# Patient Record
Sex: Female | Born: 1985 | Race: Black or African American | Hispanic: No | Marital: Married | Smoking: Current some day smoker
Health system: Southern US, Community
[De-identification: ages and names within clinical notes are randomized; demographics above are authoritative.]

## PROBLEM LIST (undated history)

## (undated) ENCOUNTER — Emergency Department (HOSPITAL_COMMUNITY): Admission: EM | Payer: Medicaid Other | Source: Home / Self Care

## (undated) ENCOUNTER — Inpatient Hospital Stay (HOSPITAL_COMMUNITY): Payer: Self-pay

## (undated) DIAGNOSIS — R51 Headache: Secondary | ICD-10-CM

## (undated) DIAGNOSIS — R011 Cardiac murmur, unspecified: Secondary | ICD-10-CM

## (undated) DIAGNOSIS — Z8619 Personal history of other infectious and parasitic diseases: Secondary | ICD-10-CM

## (undated) DIAGNOSIS — N390 Urinary tract infection, site not specified: Secondary | ICD-10-CM

## (undated) HISTORY — PX: INDUCED ABORTION: SHX677

## (undated) HISTORY — PX: DILATION AND CURETTAGE OF UTERUS: SHX78

## (undated) HISTORY — DX: Personal history of other infectious and parasitic diseases: Z86.19

## (undated) HISTORY — PX: TUBAL LIGATION: SHX77

---

## 2006-02-14 ENCOUNTER — Emergency Department (HOSPITAL_COMMUNITY): Admission: EM | Admit: 2006-02-14 | Discharge: 2006-02-14 | Payer: Self-pay | Admitting: *Deleted

## 2006-08-09 ENCOUNTER — Emergency Department (HOSPITAL_COMMUNITY): Admission: EM | Admit: 2006-08-09 | Discharge: 2006-08-09 | Payer: Self-pay | Admitting: Emergency Medicine

## 2006-08-13 ENCOUNTER — Emergency Department (HOSPITAL_COMMUNITY): Admission: EM | Admit: 2006-08-13 | Discharge: 2006-08-13 | Payer: Self-pay | Admitting: Emergency Medicine

## 2006-09-14 ENCOUNTER — Emergency Department (HOSPITAL_COMMUNITY): Admission: EM | Admit: 2006-09-14 | Discharge: 2006-09-14 | Payer: Self-pay | Admitting: Emergency Medicine

## 2007-08-18 ENCOUNTER — Inpatient Hospital Stay (HOSPITAL_COMMUNITY): Admission: AD | Admit: 2007-08-18 | Discharge: 2007-08-18 | Payer: Self-pay | Admitting: Obstetrics & Gynecology

## 2007-10-08 ENCOUNTER — Inpatient Hospital Stay (HOSPITAL_COMMUNITY): Admission: AD | Admit: 2007-10-08 | Discharge: 2007-10-08 | Payer: Self-pay | Admitting: Gynecology

## 2007-10-09 ENCOUNTER — Inpatient Hospital Stay (HOSPITAL_COMMUNITY): Admission: AD | Admit: 2007-10-09 | Discharge: 2007-10-09 | Payer: Self-pay | Admitting: Obstetrics and Gynecology

## 2007-10-11 ENCOUNTER — Inpatient Hospital Stay (HOSPITAL_COMMUNITY): Admission: AD | Admit: 2007-10-11 | Discharge: 2007-10-11 | Payer: Self-pay | Admitting: Obstetrics & Gynecology

## 2007-10-13 ENCOUNTER — Inpatient Hospital Stay (HOSPITAL_COMMUNITY): Admission: AD | Admit: 2007-10-13 | Discharge: 2007-10-13 | Payer: Self-pay | Admitting: Obstetrics and Gynecology

## 2007-10-20 ENCOUNTER — Inpatient Hospital Stay (HOSPITAL_COMMUNITY): Admission: AD | Admit: 2007-10-20 | Discharge: 2007-10-20 | Payer: Self-pay | Admitting: Obstetrics & Gynecology

## 2007-10-28 ENCOUNTER — Observation Stay (HOSPITAL_COMMUNITY): Admission: AD | Admit: 2007-10-28 | Discharge: 2007-10-28 | Payer: Self-pay | Admitting: Obstetrics and Gynecology

## 2007-11-09 ENCOUNTER — Inpatient Hospital Stay (HOSPITAL_COMMUNITY): Admission: AD | Admit: 2007-11-09 | Discharge: 2007-11-09 | Payer: Self-pay | Admitting: Obstetrics & Gynecology

## 2008-02-07 ENCOUNTER — Inpatient Hospital Stay (HOSPITAL_COMMUNITY): Admission: AD | Admit: 2008-02-07 | Discharge: 2008-02-07 | Payer: Self-pay | Admitting: Obstetrics

## 2008-03-24 ENCOUNTER — Inpatient Hospital Stay (HOSPITAL_COMMUNITY): Admission: AD | Admit: 2008-03-24 | Discharge: 2008-03-24 | Payer: Self-pay | Admitting: Obstetrics

## 2008-04-01 ENCOUNTER — Inpatient Hospital Stay (HOSPITAL_COMMUNITY): Admission: AD | Admit: 2008-04-01 | Discharge: 2008-04-01 | Payer: Self-pay | Admitting: Obstetrics

## 2008-04-15 ENCOUNTER — Inpatient Hospital Stay (HOSPITAL_COMMUNITY): Admission: AD | Admit: 2008-04-15 | Discharge: 2008-04-15 | Payer: Self-pay | Admitting: Obstetrics

## 2008-05-09 ENCOUNTER — Inpatient Hospital Stay (HOSPITAL_COMMUNITY): Admission: AD | Admit: 2008-05-09 | Discharge: 2008-05-11 | Payer: Self-pay | Admitting: Obstetrics

## 2008-05-09 ENCOUNTER — Inpatient Hospital Stay (HOSPITAL_COMMUNITY): Admission: AD | Admit: 2008-05-09 | Discharge: 2008-05-09 | Payer: Self-pay | Admitting: Obstetrics

## 2008-05-13 ENCOUNTER — Inpatient Hospital Stay (HOSPITAL_COMMUNITY): Admission: AD | Admit: 2008-05-13 | Discharge: 2008-05-13 | Payer: Self-pay | Admitting: Obstetrics

## 2008-05-25 ENCOUNTER — Inpatient Hospital Stay (HOSPITAL_COMMUNITY): Admission: AD | Admit: 2008-05-25 | Discharge: 2008-05-25 | Payer: Self-pay | Admitting: Obstetrics

## 2008-05-31 ENCOUNTER — Inpatient Hospital Stay (HOSPITAL_COMMUNITY): Admission: AD | Admit: 2008-05-31 | Discharge: 2008-05-31 | Payer: Self-pay | Admitting: Obstetrics

## 2008-06-09 ENCOUNTER — Inpatient Hospital Stay (HOSPITAL_COMMUNITY): Admission: AD | Admit: 2008-06-09 | Discharge: 2008-06-09 | Payer: Self-pay | Admitting: Obstetrics

## 2008-06-15 ENCOUNTER — Inpatient Hospital Stay (HOSPITAL_COMMUNITY): Admission: AD | Admit: 2008-06-15 | Discharge: 2008-06-17 | Payer: Self-pay | Admitting: Obstetrics

## 2008-10-04 ENCOUNTER — Inpatient Hospital Stay (HOSPITAL_COMMUNITY): Admission: AD | Admit: 2008-10-04 | Discharge: 2008-10-04 | Payer: Self-pay | Admitting: Obstetrics

## 2008-11-01 ENCOUNTER — Encounter (INDEPENDENT_AMBULATORY_CARE_PROVIDER_SITE_OTHER): Payer: Self-pay | Admitting: Cardiology

## 2008-11-01 ENCOUNTER — Ambulatory Visit (HOSPITAL_COMMUNITY): Admission: RE | Admit: 2008-11-01 | Discharge: 2008-11-01 | Payer: Self-pay | Admitting: Cardiology

## 2008-12-12 ENCOUNTER — Encounter: Admission: RE | Admit: 2008-12-12 | Discharge: 2008-12-12 | Payer: Self-pay | Admitting: Cardiology

## 2009-01-01 ENCOUNTER — Emergency Department (HOSPITAL_COMMUNITY): Admission: EM | Admit: 2009-01-01 | Discharge: 2009-01-01 | Payer: Self-pay | Admitting: Emergency Medicine

## 2010-02-14 ENCOUNTER — Inpatient Hospital Stay (HOSPITAL_COMMUNITY): Admission: AD | Admit: 2010-02-14 | Discharge: 2010-02-14 | Payer: Self-pay | Admitting: Obstetrics

## 2010-02-14 ENCOUNTER — Ambulatory Visit: Payer: Self-pay | Admitting: Nurse Practitioner

## 2010-03-13 ENCOUNTER — Emergency Department (HOSPITAL_COMMUNITY): Admission: EM | Admit: 2010-03-13 | Discharge: 2010-03-13 | Payer: Self-pay | Admitting: Emergency Medicine

## 2010-05-21 ENCOUNTER — Ambulatory Visit: Payer: Self-pay | Admitting: Nurse Practitioner

## 2010-05-21 ENCOUNTER — Inpatient Hospital Stay (HOSPITAL_COMMUNITY): Admission: AD | Admit: 2010-05-21 | Discharge: 2010-05-21 | Payer: Self-pay | Admitting: Obstetrics

## 2010-10-03 LAB — WET PREP, GENITAL
Trich, Wet Prep: NONE SEEN
Yeast Wet Prep HPF POC: NONE SEEN

## 2010-10-03 LAB — CBC
HCT: 31.9 % — ABNORMAL LOW (ref 36.0–46.0)
Hemoglobin: 10.9 g/dL — ABNORMAL LOW (ref 12.0–15.0)
MCH: 32.4 pg (ref 26.0–34.0)
MCHC: 34.2 g/dL (ref 30.0–36.0)
RBC: 3.37 MIL/uL — ABNORMAL LOW (ref 3.87–5.11)

## 2010-10-03 LAB — GC/CHLAMYDIA PROBE AMP, GENITAL: GC Probe Amp, Genital: POSITIVE — AB

## 2010-10-03 LAB — URINALYSIS, ROUTINE W REFLEX MICROSCOPIC
Glucose, UA: NEGATIVE mg/dL
Hgb urine dipstick: NEGATIVE
Protein, ur: NEGATIVE mg/dL
Urobilinogen, UA: 2 mg/dL — ABNORMAL HIGH (ref 0.0–1.0)

## 2010-10-03 LAB — POCT PREGNANCY, URINE: Preg Test, Ur: POSITIVE

## 2010-10-03 LAB — HCG, QUANTITATIVE, PREGNANCY: hCG, Beta Chain, Quant, S: 232203 m[IU]/mL — ABNORMAL HIGH (ref <5)

## 2010-10-06 LAB — CBC
Hemoglobin: 11.4 g/dL — ABNORMAL LOW (ref 12.0–15.0)
Platelets: 252 10*3/uL (ref 150–400)
RBC: 3.58 MIL/uL — ABNORMAL LOW (ref 3.87–5.11)
WBC: 4 10*3/uL (ref 4.0–10.5)

## 2010-10-06 LAB — WET PREP, GENITAL: Yeast Wet Prep HPF POC: NONE SEEN

## 2010-10-06 LAB — POCT PREGNANCY, URINE: Preg Test, Ur: NEGATIVE

## 2010-10-06 LAB — GC/CHLAMYDIA PROBE AMP, GENITAL: GC Probe Amp, Genital: POSITIVE — AB

## 2010-11-01 LAB — CBC
HCT: 33.1 % — ABNORMAL LOW (ref 36.0–46.0)
Hemoglobin: 10.7 g/dL — ABNORMAL LOW (ref 12.0–15.0)
MCV: 82.1 fL (ref 78.0–100.0)
RBC: 4.03 MIL/uL (ref 3.87–5.11)
WBC: 5.6 10*3/uL (ref 4.0–10.5)

## 2010-11-01 LAB — BASIC METABOLIC PANEL
BUN: 15 mg/dL (ref 6–23)
CO2: 25 mEq/L (ref 19–32)
Chloride: 110 mEq/L (ref 96–112)
GFR calc non Af Amer: >60 mL/min (ref >60)
Glucose, Bld: 84 mg/dL (ref 70–99)
Potassium: 3.5 mEq/L (ref 3.5–5.1)
Sodium: 140 mEq/L (ref 135–145)

## 2010-12-07 NOTE — Discharge Summary (Signed)
NAMEALEXANDRYA, Tara [name]             ACCOUNT NO.:  [id number]   MEDICAL RECORD NO.:  [id number]           PATIENT TYPE:   LOCATION:                                 FACILITY:   PHYSICIAN:  Tilda Burrow, M.D. DATE OF BIRTH:  05-07-1986   DATE OF ADMISSION:  11/27/2007  DATE OF DISCHARGE:  11/28/2007                               DISCHARGE SUMMARY   ADMITTING DIAGNOSES:  1. Pregnancy of 6 weeks 6 days.  2. Hyperemesis gravidarum.   DISCHARGE DIAGNOSES:  1. Pregnancy of 6 weeks 6 days.  2. Hyperemesis gravidarum, improved.   PROCEDURE:  IV fluid hydration.   DISCHARGE MEDICATIONS:  1. Zofran 8 mg tablets 1 orally every 12 hours as needed, dispensed 20      tablets, refill x2.  2. Phenergan 25 mg tablet 10 one p.o. q.6 h. p.r.n. severe nausea.  3. Prenatal vitamins 1 p.o. daily, refill p.r.n.   [facility], [facility], [phone].   HOSPITAL SUMMARY:  This 25 year old gravida 2, para 1 at 6 weeks 6 days  was admitted after presenting to MAU, where evaluation identified mild  dehydration along with nausea and vomiting as noted in the assessment  record. Laboratory data included a hemoglobin 12, hematocrit 35.5, and  white count 7600.  BUN 14, creatinine 0.47, sodium 136, potassium 3.6  with EGFR greater than 60.  She had a HIV that was nonreactive and  hepatitis B surface antibody that was positive.  Rubella immunity is  present.  Urinalysis show a specific gravity of 1.030, greater than 80  mg/dL ketonuria, with a negative nitrites or esterase.   HOSPITAL COURSE:  The patient was admitted after initial assessment in  MAU, identified need for fluid hydration.  She received overnight fluids  with 3 L of IV fluids administered through the night along with IV  Phenergan and a single dose of 4 mg of Zofran intravenously as well.  The patient was afebrile, had blood pressure 107/60, pulse in the 90s.  Overnight, she tolerated the fluids well, was taking sips of liquid  the  following day, and was discharged at 6:30 a.m. on October 29, 2007, for  initial prenatal care at the [facility].      Tilda Burrow, M.D.  Electronically Signed     JVF/MEDQ  D:  12/02/2007  T:  12/03/2007  Job:  [id number]

## 2011-03-11 ENCOUNTER — Encounter (HOSPITAL_COMMUNITY): Payer: Self-pay | Admitting: *Deleted

## 2011-03-11 ENCOUNTER — Inpatient Hospital Stay (HOSPITAL_COMMUNITY)
Admission: AD | Admit: 2011-03-11 | Discharge: 2011-03-11 | Disposition: A | Discharge status: Home / Self Care | Payer: Self-pay | Source: Ambulatory Visit | Attending: Obstetrics & Gynecology | Admitting: Obstetrics & Gynecology

## 2011-03-11 DIAGNOSIS — Z3201 Encounter for pregnancy test, result positive: Secondary | ICD-10-CM

## 2011-03-11 DIAGNOSIS — O210 Mild hyperemesis gravidarum: Secondary | ICD-10-CM | POA: Insufficient documentation

## 2011-03-11 DIAGNOSIS — O219 Vomiting of pregnancy, unspecified: Secondary | ICD-10-CM | POA: Diagnosis present

## 2011-03-11 LAB — URINALYSIS, ROUTINE W REFLEX MICROSCOPIC
Glucose, UA: NEGATIVE mg/dL
Hgb urine dipstick: NEGATIVE
Specific Gravity, Urine: 1.025 (ref 1.005–1.030)

## 2011-03-11 LAB — POCT PREGNANCY, URINE: Preg Test, Ur: POSITIVE

## 2011-03-11 LAB — URINE MICROSCOPIC-ADD ON

## 2011-03-11 MED ORDER — PROMETHAZINE HCL 25 MG PO TABS: 25.0000 mg | ORAL_TABLET | Freq: Four times a day (QID) | ORAL | Status: AC | PRN

## 2011-03-11 MED ORDER — ONDANSETRON 8 MG PO TBDP
8.0000 mg | ORAL_TABLET | Freq: Once | ORAL | Status: AC
  Administered 2011-03-11: 8 mg via ORAL
  Filled 2011-03-11: qty 1

## 2011-03-11 NOTE — Progress Notes (Signed)
Throwing up and dizziness started on Sat.  Period is late was due on the 5th.  Has not done home test.

## 2011-03-11 NOTE — ED Provider Notes (Signed)
History     Chief Complaint  Patient presents with  . Emesis   HPI Vomiting and dizziness started on Saturday. Able to eat and drink in the afternoon, most vomiting in the morning. Patient's last menstrual period was 01/24/2011. Has not done a home pregnancy test. No pain or bleeding.   OB History    Grav Para Term Preterm Abortions TAB SAB Ect Mult Living   4 1 1  2 2    1       Past Medical History  Diagnosis Date  . No pertinent past medical history     Past Surgical History  Procedure Date  . No past surgeries     No family history on file.  History  Substance Use Topics  . Smoking status: Former Games developer  . Smokeless tobacco: Not on file  . Alcohol Use: No    Allergies: No Known Allergies  No prescriptions prior to admission    Review of Systems  Constitutional: Negative.   Respiratory: Negative.   Cardiovascular: Negative.   Gastrointestinal: Positive for nausea and vomiting. Negative for abdominal pain, diarrhea and constipation.  Genitourinary: Negative for dysuria, urgency, frequency, hematuria and flank pain.       Negative for vaginal bleeding, cramping/contractions  Musculoskeletal: Negative.   Neurological: Negative.   Psychiatric/Behavioral: Negative.    Physical Exam   Blood pressure 105/62, pulse 75, temperature 98.5 F (36.9 C), temperature source Oral, resp. rate 18, height 5' 1.5" (1.562 m), weight 46.72 kg (103 lb), last menstrual period 01/24/2011.  Physical Exam  Constitutional: She is oriented to person, place, and time. She appears well-developed and well-nourished. No distress.  Cardiovascular: Normal rate.   Respiratory: Effort normal.  GI: Soft. There is no tenderness.  Musculoskeletal: Normal range of motion.  Neurological: She is alert and oriented to person, place, and time.  Skin: Skin is warm and dry.  Psychiatric: She has a normal mood and affect.    MAU Course  Procedures  Results for orders placed during the  hospital encounter of 03/11/11 (from the past 24 hour(s))  URINALYSIS, ROUTINE W REFLEX MICROSCOPIC     Status: Abnormal   Collection Time   03/11/11 10:30 AM      Component Value Range   Color, Urine YELLOW  YELLOW    Appearance HAZY (*) CLEAR    Specific Gravity, Urine 1.025  1.005 - 1.030    pH 7.0  5.0 - 8.0    Glucose, UA NEGATIVE  NEGATIVE (mg/dL)   Hgb urine dipstick NEGATIVE  NEGATIVE    Bilirubin Urine NEGATIVE  NEGATIVE    Ketones, ur 15 (*) NEGATIVE (mg/dL)   Protein, ur 30 (*) NEGATIVE (mg/dL)   Urobilinogen, UA 2.0 (*) 0.0 - 1.0 (mg/dL)   Nitrite NEGATIVE  NEGATIVE    Leukocytes, UA SMALL (*) NEGATIVE   POCT PREGNANCY, URINE     Status: Normal   Collection Time   03/11/11 10:30 AM      Component Value Range   Preg Test, Ur POSITIVE    URINE MICROSCOPIC-ADD ON     Status: Abnormal   Collection Time   03/11/11 10:30 AM      Component Value Range   Squamous Epithelial / LPF RARE  RARE    WBC, UA 3-6  <3 (WBC/hpf)   RBC / HPF 0-2  <3 (RBC/hpf)   Bacteria, UA FEW (*) RARE    Urine-Other MUCOUS PRESENT       Assessment and Plan  25  y.o. [id number] at [redacted]w[redacted]d Pregnancy nausea and vomiting Rx phenergan, rev'd lifestyle measures Start prenatal care asap   [facility] 03/11/2011, 10:45 AM

## 2011-03-12 NOTE — ED Provider Notes (Signed)
Agree with above note.  LEGGETT,KELLY H. 03/12/2011 6:31 AM

## 2011-03-18 ENCOUNTER — Inpatient Hospital Stay (HOSPITAL_COMMUNITY): Payer: Self-pay

## 2011-03-18 ENCOUNTER — Inpatient Hospital Stay (HOSPITAL_COMMUNITY)
Admission: AD | Admit: 2011-03-18 | Discharge: 2011-03-18 | Disposition: A | Discharge status: Home / Self Care | Payer: Self-pay | Source: Ambulatory Visit | Attending: Obstetrics | Admitting: Obstetrics

## 2011-03-18 DIAGNOSIS — O200 Threatened abortion: Secondary | ICD-10-CM | POA: Insufficient documentation

## 2011-03-18 LAB — CBC
MCH: 29.9 pg (ref 26.0–34.0)
MCHC: 33.3 g/dL (ref 30.0–36.0)
Platelets: 271 10*3/uL (ref 150–400)

## 2011-03-18 LAB — HCG, QUANTITATIVE, PREGNANCY: hCG, Beta Chain, Quant, S: 195506 m[IU]/mL — ABNORMAL HIGH (ref <5)

## 2011-03-18 LAB — DIFFERENTIAL
Basophils Relative: 0 % (ref 0–1)
Eosinophils Absolute: 0.1 10*3/uL (ref 0.0–0.7)
Monocytes Relative: 7 % (ref 3–12)
Neutrophils Relative %: 63 % (ref 43–77)

## 2011-03-18 LAB — WET PREP, GENITAL: Trich, Wet Prep: NONE SEEN

## 2011-03-18 LAB — ABO/RH: ABO/RH(D): O NEG

## 2011-03-18 MED ORDER — RHO D IMMUNE GLOBULIN 1500 UNIT/2ML IJ SOLN
300.0000 ug | Freq: Once | INTRAMUSCULAR | Status: AC
  Administered 2011-03-18: 300 ug via INTRAMUSCULAR
  Filled 2011-03-18: qty 2

## 2011-03-18 NOTE — ED Provider Notes (Signed)
History     CSN: [id number] Arrival date & time: 03/18/2011  2:10 PM  Chief Complaint  Patient presents with  . Abdominal Cramping   HPI Tara [name] is a 25 y.o. female who presents to MAU with low abdominal cramping and vaginal bleeding that started last night. She states that last night the bleeding was heavier and the cramping was worse than today. Last sexual intercourse 2 days ago. The history was provided by the patient.    Past Medical History  Diagnosis Date  . No pertinent past medical history     Past Surgical History  Procedure Date  . No past surgeries     No family history on file.  History  Substance Use Topics  . Smoking status: Former Games developer  . Smokeless tobacco: Not on file  . Alcohol Use: No    OB History    Grav Para Term Preterm Abortions TAB SAB Ect Mult Living   4 1 1  2 2    1       Review of Systems  Constitutional: Positive for fatigue. Negative for fever, chills and diaphoresis.  HENT: Negative for sore throat, facial swelling, neck pain, dental problem and sinus pressure.   Eyes: Negative for photophobia and pain.  Respiratory: Negative for cough and wheezing.   Cardiovascular: Negative.   Gastrointestinal: Positive for nausea and abdominal pain. Negative for vomiting, diarrhea, constipation and abdominal distention.  Genitourinary: Positive for frequency and pelvic pain. Negative for dysuria, flank pain and difficulty urinating.  Musculoskeletal: Negative for myalgias, back pain and gait problem.  Skin: Negative for color change and rash.  Neurological: Negative for dizziness, light-headedness and headaches.  Psychiatric/Behavioral: Negative for confusion and agitation.    Physical Exam  BP 108/61  Pulse 83  Temp(Src) 98.4 F (36.9 C) (Oral)  Resp 16  Ht 5\' 1"  (1.549 m)  Wt 103 lb (46.72 kg)  BMI 19.46 kg/m2  SpO2 100%  LMP 01/24/2011  Physical Exam  Nursing note and vitals reviewed. Constitutional: She is oriented to  person, place, and time. She appears well-developed and well-nourished.  Eyes: EOM are normal.  Neck: Neck supple.  Pulmonary/Chest: Effort normal.  Abdominal: Soft. There is tenderness in the right lower quadrant. There is no rebound and no guarding.    Genitourinary:       Small amount of blood tinged mucous discharge. Cervix inflamed, no CMT. Mildly tender right adnexal area.  Musculoskeletal: Normal range of motion.  Neurological: She is alert and oriented to person, place, and time. No cranial nerve deficit.  Skin: Skin is warm and dry.    ED Course  Procedures Results for orders placed during the hospital encounter of 03/18/11 (from the past 24 hour(s))  CBC     Status: Abnormal   Collection Time   03/18/11  3:08 PM      Component Value Range   WBC 6.9  4.0 - 10.5 (K/uL)   RBC 3.55 (*) 3.87 - 5.11 (MIL/uL)   Hemoglobin 10.6 (*) 12.0 - 15.0 (g/dL)   HCT 78.2 (*) 95.6 - 46.0 (%)   MCV 89.6  78.0 - 100.0 (fL)   MCH 29.9  26.0 - 34.0 (pg)   MCHC 33.3  30.0 - 36.0 (g/dL)   RDW 21.3  08.6 - 57.8 (%)   Platelets 271  150 - 400 (K/uL)  DIFFERENTIAL     Status: Normal   Collection Time   03/18/11  3:08 PM      Component Value  Range   Neutrophils Relative 63  43 - 77 (%)   Neutro Abs 4.3  1.7 - 7.7 (K/uL)   Lymphocytes Relative 28  12 - 46 (%)   Lymphs Abs 1.9  0.7 - 4.0 (K/uL)   Monocytes Relative 7  3 - 12 (%)   Monocytes Absolute 0.5  0.1 - 1.0 (K/uL)   Eosinophils Relative 1  0 - 5 (%)   Eosinophils Absolute 0.1  0.0 - 0.7 (K/uL)   Basophils Relative 0  0 - 1 (%)   Basophils Absolute 0.0  0.0 - 0.1 (K/uL)  HCG, QUANTITATIVE, PREGNANCY     Status: Abnormal   Collection Time   03/18/11  3:08 PM      Component Value Range   hCG, Beta Chain, Quant, [place] 161096 (*) <5 (mIU/mL)  ABO/RH     Status: Normal   Collection Time   03/18/11  3:08 PM      Component Value Range   ABO/RH(D) O NEG     MDM  US Ob Comp Less 14 Wks  03/18/2011  OBSTETRICAL ULTRASOUND: This exam was  performed within a [facility]. The OB US report was generated in the [facility] system, and faxed to the ordering physician.   This report is also available in [employer] and in the [employer]. See [facility] Obstetric US report.   Ultrasound showed a 7 week 4 day IUP with FH 132. The gestational sac is irregular and chorionic bump/hemorrhage are poor prognostic signs. No adnexal mass.   Assessment: threatened AB  Plan: Pelvic rest           Return as needed.

## 2011-03-18 NOTE — Progress Notes (Signed)
Pt states she was having bleeding like a period last night, less this am and not wearing a pad at this time. Had some cramping. Last intercourse 8-25.

## 2011-03-19 LAB — RH IG WORKUP (INCLUDES ABO/RH)
ABO/RH(D): O NEG
Gestational Age(Wks): 7.4

## 2011-03-21 NOTE — ED Provider Notes (Signed)
Agree with above note.  Tara [name],Tara [name] 03/21/2011 10:59 AM

## 2011-04-11 LAB — URINE MICROSCOPIC-ADD ON

## 2011-04-11 LAB — URINALYSIS, ROUTINE W REFLEX MICROSCOPIC
Glucose, UA: NEGATIVE
Ketones, ur: NEGATIVE
Specific Gravity, Urine: 1.02
pH: 7.5

## 2011-04-15 LAB — WET PREP, GENITAL: Trich, Wet Prep: NONE SEEN

## 2011-04-15 LAB — URINALYSIS, ROUTINE W REFLEX MICROSCOPIC
Bilirubin Urine: NEGATIVE
Bilirubin Urine: NEGATIVE
Glucose, UA: NEGATIVE
Glucose, UA: NEGATIVE
Hgb urine dipstick: NEGATIVE
Hgb urine dipstick: NEGATIVE
Ketones, ur: NEGATIVE
Ketones, ur: NEGATIVE
Ketones, ur: NEGATIVE
Leukocytes, UA: NEGATIVE
Nitrite: NEGATIVE
Protein, ur: NEGATIVE
Specific Gravity, Urine: 1.02
Urobilinogen, UA: 0.2
pH: 8
pH: 8.5 — ABNORMAL HIGH

## 2011-04-15 LAB — CBC
HCT: 35.2 — ABNORMAL LOW
HCT: 36.4
MCV: 92.4
Platelets: 249
Platelets: 283
RBC: 3.81 — ABNORMAL LOW
RBC: 3.98
WBC: 4.6
WBC: 5.9

## 2011-04-15 LAB — POCT PREGNANCY, URINE: Operator id: 223731

## 2011-04-15 LAB — GC/CHLAMYDIA PROBE AMP, GENITAL
Chlamydia, DNA Probe: POSITIVE — AB
GC Probe Amp, Genital: POSITIVE — AB

## 2011-04-15 LAB — URINE MICROSCOPIC-ADD ON

## 2011-04-15 LAB — HCG, QUANTITATIVE, PREGNANCY
hCG, Beta Chain, Quant, S: 1094 — ABNORMAL HIGH
hCG, Beta Chain, Quant, S: 226 — ABNORMAL HIGH

## 2011-04-16 LAB — COMPREHENSIVE METABOLIC PANEL
ALT: 13
AST: 19
CO2: 22
Calcium: 9.5
Chloride: 105
GFR calc Af Amer: >60
GFR calc non Af Amer: >60
Potassium: 3.6
Sodium: 136
Total Bilirubin: 0.9

## 2011-04-16 LAB — URINE MICROSCOPIC-ADD ON

## 2011-04-16 LAB — RUBELLA SCREEN: Rubella: 43.9 — ABNORMAL HIGH

## 2011-04-16 LAB — URINALYSIS, ROUTINE W REFLEX MICROSCOPIC
Bilirubin Urine: NEGATIVE
Nitrite: NEGATIVE
Nitrite: NEGATIVE
Specific Gravity, Urine: >1.03 — ABNORMAL HIGH
Specific Gravity, Urine: >1.03 — ABNORMAL HIGH
Urobilinogen, UA: 0.2
pH: 5.5

## 2011-04-16 LAB — CBC
RBC: 3.84 — ABNORMAL LOW
WBC: 7.6

## 2011-04-19 LAB — URINALYSIS, ROUTINE W REFLEX MICROSCOPIC
Bilirubin Urine: NEGATIVE
Hgb urine dipstick: NEGATIVE
Protein, ur: NEGATIVE
Urobilinogen, UA: 0.2

## 2011-04-19 LAB — URINE MICROSCOPIC-ADD ON

## 2011-04-22 LAB — CBC
HCT: 27.2 — ABNORMAL LOW
MCV: 94.8
Platelets: 248
RDW: 12.6

## 2011-04-22 LAB — URINALYSIS, ROUTINE W REFLEX MICROSCOPIC
Bilirubin Urine: NEGATIVE
Glucose, UA: NEGATIVE
Hgb urine dipstick: NEGATIVE
Ketones, ur: NEGATIVE
Nitrite: NEGATIVE
Specific Gravity, Urine: 1.02
Urobilinogen, UA: 0.2
pH: 7.5
pH: 6

## 2011-04-22 LAB — URINE MICROSCOPIC-ADD ON

## 2011-04-22 LAB — WET PREP, GENITAL

## 2011-04-23 LAB — CBC
Hemoglobin: 9.4 g/dL — ABNORMAL LOW (ref 12.0–15.0)
MCHC: 34 g/dL (ref 30.0–36.0)
MCV: 92.4 fL (ref 78.0–100.0)
Platelets: 232 10*3/uL (ref 150–400)
RBC: 3.03 MIL/uL — ABNORMAL LOW (ref 3.87–5.11)
WBC: 17.7 10*3/uL — ABNORMAL HIGH (ref 4.0–10.5)
WBC: 6.6 10*3/uL (ref 4.0–10.5)

## 2011-04-23 LAB — RH IMMUNE GLOB WKUP(>/=20WKS)(NOT WOMEN'S HOSP): Fetal Screen: NEGATIVE

## 2011-04-24 LAB — RH IMMUNE GLOBULIN WORKUP (NOT WOMEN'S HOSP): ABO/RH(D): O NEG

## 2011-04-24 LAB — WET PREP, GENITAL
Clue Cells Wet Prep HPF POC: NONE SEEN
Trich, Wet Prep: NONE SEEN

## 2011-04-24 LAB — GC/CHLAMYDIA PROBE AMP, GENITAL
Chlamydia, DNA Probe: POSITIVE — AB
GC Probe Amp, Genital: POSITIVE — AB

## 2011-07-23 NOTE — L&D Delivery Note (Signed)
Delivery Note At 9:12 PM a viable female was delivered via Vaginal, Spontaneous Delivery (Presentation: Right Occiput Anterior).  APGAR: 9, 10; weight .   Placenta status: Intact, Spontaneous.  Cord: 3 vessels with the following complications: None.  Cord pH: not done  Anesthesia: Epidural  Episiotomy: Median Lacerations:  Suture Repair: 2.0 vicryl Est. Blood Loss (mL):   Mom to postpartum.  Baby to nursery-stable.  Tara [name] A 10/27/2011, 9:22 PM

## 2011-08-16 ENCOUNTER — Encounter (HOSPITAL_COMMUNITY): Payer: Self-pay | Admitting: *Deleted

## 2011-08-16 ENCOUNTER — Inpatient Hospital Stay (HOSPITAL_COMMUNITY)
Admission: AD | Admit: 2011-08-16 | Discharge: 2011-08-16 | Disposition: A | Discharge status: Home / Self Care | Payer: Medicaid Other | Source: Ambulatory Visit | Attending: Obstetrics | Admitting: Obstetrics

## 2011-08-16 DIAGNOSIS — O99891 Other specified diseases and conditions complicating pregnancy: Secondary | ICD-10-CM | POA: Insufficient documentation

## 2011-08-16 DIAGNOSIS — K219 Gastro-esophageal reflux disease without esophagitis: Secondary | ICD-10-CM

## 2011-08-16 DIAGNOSIS — R109 Unspecified abdominal pain: Secondary | ICD-10-CM | POA: Insufficient documentation

## 2011-08-16 DIAGNOSIS — O479 False labor, unspecified: Secondary | ICD-10-CM

## 2011-08-16 MED ORDER — GI COCKTAIL ~~LOC~~
30.0000 mL | Freq: Once | ORAL | Status: AC
  Administered 2011-08-16: 30 mL via ORAL
  Filled 2011-08-16: qty 30

## 2011-08-16 MED ORDER — RANITIDINE HCL 150 MG PO TABS: 150.0000 mg | ORAL_TABLET | Freq: Two times a day (BID) | ORAL | Status: DC

## 2011-08-16 NOTE — Progress Notes (Signed)
Patient states she has had upper chest pain with coughing since last night. Has had lower abdominal pain since this afternoon. Denies any bleeding or leaking and reports good fetal movement.

## 2011-08-16 NOTE — ED Provider Notes (Signed)
History   Pt presents today c/o lower abd cramping and a burning sensation in her chest when she coughs. She denies N&V, fever, vag dc, bleeding. She reports GFM. She states her abd pain has improved since arriving in the MAU.  Chief Complaint  Patient presents with  . Abdominal Pain   HPI  OB History    Grav Para Term Preterm Abortions TAB SAB Ect Mult Living   4 1 1  2 2    1       Past Medical History  Diagnosis Date  . No pertinent past medical history     Past Surgical History  Procedure Date  . Induced abortion     No family history on file.  History  Substance Use Topics  . Smoking status: Current Everyday Smoker -- 0.0 packs/day  . Smokeless tobacco: Not on file  . Alcohol Use: No    Allergies: No Known Allergies  Prescriptions prior to admission  Medication Sig Dispense Refill  . Prenatal Vit-Fe Fumarate-FA (PRENATAL MULTIVITAMIN) TABS Take 1 tablet by mouth daily.        Review of Systems  Constitutional: Negative for fever.  Eyes: Negative for blurred vision and double vision.  Respiratory: Positive for cough. Negative for hemoptysis, sputum production, shortness of breath and wheezing.   Cardiovascular: Negative for chest pain, palpitations, orthopnea, claudication, leg swelling and PND.  Gastrointestinal: Positive for abdominal pain. Negative for nausea, vomiting, diarrhea and constipation.  Genitourinary: Negative for dysuria, urgency, frequency and hematuria.  Neurological: Negative for dizziness and headaches.  Psychiatric/Behavioral: Negative for depression and suicidal ideas.   Physical Exam   Blood pressure 105/51, pulse 106, temperature 98.1 F (36.7 C), temperature source Oral, resp. rate 18, height 5\' 2"  (1.575 m), weight 127 lb 9.6 oz (57.879 kg), last menstrual period 01/24/2011, SpO2 97.00%.  Physical Exam  Nursing note and vitals reviewed. Constitutional: She is oriented to person, place, and time. She appears well-developed and  well-nourished. No distress.  HENT:  Head: Normocephalic and atraumatic.  Eyes: EOM are normal. Pupils are equal, round, and reactive to light.  Cardiovascular: Normal rate, regular rhythm and normal heart sounds.  Exam reveals no gallop and no friction rub.   No murmur heard. Respiratory: Effort normal and breath sounds normal. No respiratory distress. She has no wheezes. She has no rales. She exhibits no tenderness.  GI: Soft. She exhibits no distension. There is no tenderness. There is no rebound and no guarding.  Genitourinary: No bleeding around the vagina. No vaginal discharge found.       Cervix Lg/closed.  Neurological: She is alert and oriented to person, place, and time.  Skin: Skin is warm and dry. She is not diaphoretic.  Psychiatric: She has a normal mood and affect. Her behavior is normal. Judgment and thought content normal.    MAU Course  Procedures  Pt sx resolved following GI cocktail.  NST reactive for 29wks with occ irritability.  Assessment and Plan  Abd pain in preg: pt likely having some braxton hicks ctx and GERD. She has f/u with Dr. Gaynell Face scheduled on Monday. Will give Rx for Zantac. Discussed diet, activity, risks, and precautions.  Clinton Gallant. Jeven Topper III, DrHSc, MPAS, PA-C  08/16/2011, 5:12 PM   Henrietta Hoover, PA 08/16/11 1737

## 2011-08-16 NOTE — ED Notes (Signed)
Pt c/o dizziness, HOB back to 30% incline.

## 2011-08-19 ENCOUNTER — Other Ambulatory Visit: Payer: Self-pay | Admitting: Obstetrics

## 2011-08-19 ENCOUNTER — Encounter (HOSPITAL_COMMUNITY): Payer: Self-pay

## 2011-08-19 ENCOUNTER — Inpatient Hospital Stay (HOSPITAL_COMMUNITY)
Admission: AD | Admit: 2011-08-19 | Discharge: 2011-08-19 | Disposition: A | Discharge status: Home / Self Care | Payer: Medicaid Other | Source: Ambulatory Visit | Attending: Obstetrics | Admitting: Obstetrics

## 2011-08-19 DIAGNOSIS — Z2989 Encounter for other specified prophylactic measures: Secondary | ICD-10-CM | POA: Insufficient documentation

## 2011-08-19 DIAGNOSIS — Z298 Encounter for other specified prophylactic measures: Secondary | ICD-10-CM | POA: Insufficient documentation

## 2011-08-19 MED ORDER — RHO D IMMUNE GLOBULIN 1500 UNIT/2ML IJ SOLN
300.0000 ug | Freq: Once | INTRAMUSCULAR | Status: AC
  Administered 2011-08-19: 300 ug via INTRAMUSCULAR
  Filled 2011-08-19: qty 2

## 2011-08-19 NOTE — ED Notes (Signed)
Pt given pager #[phone]

## 2011-08-19 NOTE — Progress Notes (Signed)
Here for Rhophylac workup, denies pain or vag d/c changes or bleeding. +FM per pt.

## 2011-08-20 LAB — RH IG WORKUP (INCLUDES ABO/RH)
ABO/RH(D): O NEG
Gestational Age(Wks): 29.4

## 2011-09-02 ENCOUNTER — Inpatient Hospital Stay (HOSPITAL_COMMUNITY)
Admission: AD | Admit: 2011-09-02 | Discharge: 2011-09-02 | Disposition: A | Discharge status: Home / Self Care | Payer: Medicaid Other | Attending: Obstetrics | Admitting: Obstetrics

## 2011-09-02 ENCOUNTER — Encounter (HOSPITAL_COMMUNITY): Payer: Self-pay

## 2011-09-02 DIAGNOSIS — O99891 Other specified diseases and conditions complicating pregnancy: Secondary | ICD-10-CM | POA: Insufficient documentation

## 2011-09-02 DIAGNOSIS — R109 Unspecified abdominal pain: Secondary | ICD-10-CM

## 2011-09-02 DIAGNOSIS — O4700 False labor before 37 completed weeks of gestation, unspecified trimester: Secondary | ICD-10-CM | POA: Insufficient documentation

## 2011-09-02 LAB — POCT I-STAT, CHEM 8
BUN: 14 mg/dL (ref 6–23)
Calcium, Ion: 1.14 mmol/L (ref 1.12–1.32)
Creatinine, Ser: 0.5 mg/dL (ref 0.50–1.10)
TCO2: 21 mmol/L (ref 0–100)

## 2011-09-02 MED ORDER — LACTATED RINGERS IV SOLN
INTRAVENOUS | Status: DC
  Administered 2011-09-02: 09:00:00 via INTRAVENOUS

## 2011-09-02 MED ORDER — TERBUTALINE SULFATE 1 MG/ML IJ SOLN
0.2500 mg | Freq: Once | INTRAMUSCULAR | Status: AC
  Administered 2011-09-02: 0.25 mg via SUBCUTANEOUS
  Filled 2011-09-02: qty 1
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
## 2011-09-02 NOTE — Progress Notes (Signed)
ED MD notified of Dr. Tawny Hopping orders and acceptance to MAU transfer. Stated he would get [facility] notified.

## 2011-09-02 NOTE — Progress Notes (Signed)
Dr. Gaynell Face notified of pt arrival after [facility]. Reported EFM findings of reactive NST at 31 weeks with contractions every 5-7 min lasting 50 sec. Orders received to transfer pt to MAU, IVF, and Terbutaline. Stated to have MAU RN call when pt arrives.

## 2011-09-02 NOTE — ED Notes (Signed)
[facility] arrived for transport. Report given by Rapid Response OB nurse.

## 2011-09-02 NOTE — ED Notes (Signed)
Pt resting comfortably in bed. OB Rapid Response RN at bedside. Pt on fetal heart monitor. No distress noted.
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
## 2011-09-10 ENCOUNTER — Encounter (HOSPITAL_COMMUNITY): Payer: Self-pay | Admitting: *Deleted

## 2011-09-10 ENCOUNTER — Inpatient Hospital Stay (HOSPITAL_COMMUNITY)
Admission: AD | Admit: 2011-09-10 | Discharge: 2011-09-10 | Disposition: A | Discharge status: Home / Self Care | Payer: Medicaid Other | Source: Ambulatory Visit | Attending: Obstetrics | Admitting: Obstetrics

## 2011-09-10 DIAGNOSIS — O2340 Unspecified infection of urinary tract in pregnancy, unspecified trimester: Secondary | ICD-10-CM

## 2011-09-10 DIAGNOSIS — O479 False labor, unspecified: Secondary | ICD-10-CM

## 2011-09-10 DIAGNOSIS — O2390 Unspecified genitourinary tract infection in pregnancy, unspecified trimester: Secondary | ICD-10-CM

## 2011-09-10 DIAGNOSIS — O4700 False labor before 37 completed weeks of gestation, unspecified trimester: Secondary | ICD-10-CM | POA: Insufficient documentation

## 2011-09-10 DIAGNOSIS — N390 Urinary tract infection, site not specified: Secondary | ICD-10-CM

## 2011-09-10 LAB — URINE MICROSCOPIC-ADD ON

## 2011-09-10 LAB — URINALYSIS, ROUTINE W REFLEX MICROSCOPIC
Bilirubin Urine: NEGATIVE
Glucose, UA: NEGATIVE mg/dL
Ketones, ur: 15 mg/dL — AB
Protein, ur: NEGATIVE mg/dL

## 2011-09-10 LAB — FETAL FIBRONECTIN: Fetal Fibronectin: NEGATIVE

## 2011-09-10 LAB — WET PREP, GENITAL

## 2011-09-10 MED ORDER — LACTATED RINGERS IV SOLN
INTRAVENOUS | Status: DC
  Administered 2011-09-10: 18:00:00 via INTRAVENOUS

## 2011-09-10 MED ORDER — NITROFURANTOIN MONOHYD MACRO 100 MG PO CAPS: 100.0000 mg | ORAL_CAPSULE | Freq: Two times a day (BID) | ORAL | Status: AC

## 2011-09-10 MED ORDER — NIFEDIPINE ER OSMOTIC RELEASE 60 MG PO TB24: 60.0000 mg | ORAL_TABLET | Freq: Every day | ORAL | Status: DC

## 2011-09-10 MED ORDER — TERBUTALINE SULFATE 1 MG/ML IJ SOLN
0.2500 mg | Freq: Once | INTRAMUSCULAR | Status: AC
  Administered 2011-09-10: 0.25 mg via SUBCUTANEOUS
  Filled 2011-09-10: qty 1

## 2011-09-10 NOTE — Progress Notes (Signed)
Pt in c/o preterm contractions since 1500.  Denies any bleeding or leaking of fluid.  + FM.

## 2011-09-10 NOTE — ED Provider Notes (Signed)
History     CSN: [id number]  Arrival date & time 09/10/11  1606   None     Chief Complaint  Patient presents with  . Contractions    HPI Tara [name] is a 26 y.o. female @ [redacted]w[redacted]d gestation who presents to [facility] via EMS for contractions that started today approximately 3 pm. No sexual intercourse within the past 24 hours. Denies bleeding. Has increased vaginal discharge.  Past Medical History  Diagnosis Date  . No pertinent past medical history     Past Surgical History  Procedure Date  . Induced abortion   . Dilation and curettage of uterus     Family History  Problem Relation Age of Onset  . Anesthesia problems Neg Hx     History  Substance Use Topics  . Smoking status: Current Everyday Smoker -- 0.1 packs/day    Types: Cigarettes  . Smokeless tobacco: Never Used  . Alcohol Use: No    OB History    Grav Para Term Preterm Abortions TAB SAB Ect Mult Living   4 1 1  2 2    1       Review of Systems  Constitutional: Negative for fever, chills, diaphoresis and fatigue.  HENT: Negative for ear pain, congestion, sore throat, facial swelling, neck pain, neck stiffness, dental problem and sinus pressure.   Eyes: Negative for photophobia, pain and discharge.  Respiratory: Negative for cough, chest tightness and wheezing.   Gastrointestinal: Positive for abdominal pain. Negative for nausea, vomiting, diarrhea, constipation and abdominal distention.  Genitourinary: Positive for urgency and frequency. Negative for dysuria, flank pain and difficulty urinating.  Musculoskeletal: Negative for myalgias, back pain and gait problem.  Skin: Negative for color change and rash.  Neurological: Negative for dizziness, speech difficulty, weakness, light-headedness, numbness and headaches.  Psychiatric/Behavioral: Negative for confusion and agitation.    Allergies  Review of patient's allergies indicates no known allergies.  Home Medications  No current outpatient prescriptions on  file.  BP 109/50  Pulse 82  Temp(Src) 98 F (36.7 C) (Oral)  Resp 18  Ht 5\' 2"  (1.575 m)  Wt 127 lb (57.607 kg)  BMI 23.23 kg/m2  LMP 01/24/2011  Physical Exam  Nursing note and vitals reviewed. Constitutional: She is oriented to person, place, and time. She appears well-developed and well-nourished.  HENT:  Head: Normocephalic.  Eyes: EOM are normal.  Neck: Neck supple.  Cardiovascular: Normal rate.   Pulmonary/Chest: Effort normal.  Abdominal: Soft. There is no tenderness.  Genitourinary:       External genitalia without lesions, yellow discharge vaginal vault. Cervix fingertip, thick, high. Uterus consistent with dates.  Musculoskeletal: Normal range of motion.  Neurological: She is alert and oriented to person, place, and time. No cranial nerve deficit.  Skin: Skin is warm and dry.  Psychiatric: She has a normal mood and affect. Her behavior is normal. Judgment and thought content normal.    ED Course  Procedures  Results for orders placed during the hospital encounter of 09/10/11 (from the past 24 hour(s))  WET PREP, GENITAL     Status: Abnormal   Collection Time   09/10/11  5:20 PM      Component Value Range   Yeast Wet Prep HPF POC NONE SEEN  NONE SEEN    Trich, Wet Prep NONE SEEN  NONE SEEN    Clue Cells Wet Prep HPF POC FEW (*) NONE SEEN    WBC, Wet Prep HPF POC MANY (*) NONE SEEN   FETAL  FIBRONECTIN     Status: Normal   Collection Time   09/10/11  5:20 PM      Component Value Range   Fetal Fibronectin NEGATIVE  NEGATIVE   URINALYSIS, ROUTINE W REFLEX MICROSCOPIC     Status: Abnormal   Collection Time   09/10/11  5:50 PM      Component Value Range   Color, Urine YELLOW  YELLOW    APPearance CLOUDY (*) CLEAR    Specific Gravity, Urine 1.025  1.005 - 1.030    pH 6.5  5.0 - 8.0    Glucose, UA NEGATIVE  NEGATIVE (mg/dL)   Hgb urine dipstick TRACE (*) NEGATIVE    Bilirubin Urine NEGATIVE  NEGATIVE    Ketones, ur 15 (*) NEGATIVE (mg/dL)   Protein, ur  NEGATIVE  NEGATIVE (mg/dL)   Urobilinogen, UA 4.0 (*) 0.0 - 1.0 (mg/dL)   Nitrite POSITIVE (*) NEGATIVE    Leukocytes, UA SMALL (*) NEGATIVE   URINE MICROSCOPIC-ADD ON     Status: Abnormal   Collection Time   09/10/11  5:50 PM      Component Value Range   Squamous Epithelial / LPF MANY (*) RARE    WBC, UA 7-10  <3 (WBC/hpf)   Bacteria, UA MANY (*) RARE    EFM on arrival showed contractions every 2 to 4 minutes After IV hydration and terbutaline contractions stopped   Assessment: Preterm contractions   UTI in pregnancy    Plan:  Macrobid Rx   Procardia 60 XL Rx   Follow up in the office MDM Discussed with Dr. Loletha Grayer, NP 09/10/11 2035

## 2011-09-11 LAB — GC/CHLAMYDIA PROBE AMP, GENITAL: GC Probe Amp, Genital: NEGATIVE

## 2011-10-01 LAB — STREP B DNA PROBE: GBS: NEGATIVE

## 2011-10-22 ENCOUNTER — Encounter (HOSPITAL_COMMUNITY): Payer: Self-pay | Admitting: *Deleted

## 2011-10-22 ENCOUNTER — Inpatient Hospital Stay (HOSPITAL_COMMUNITY)
Admission: AD | Admit: 2011-10-22 | Discharge: 2011-10-22 | Disposition: A | Discharge status: Home / Self Care | Payer: Medicaid Other | Source: Ambulatory Visit | Attending: Obstetrics | Admitting: Obstetrics

## 2011-10-22 DIAGNOSIS — O479 False labor, unspecified: Secondary | ICD-10-CM | POA: Insufficient documentation

## 2011-10-22 HISTORY — DX: Urinary tract infection, site not specified: N39.0

## 2011-10-22 HISTORY — DX: Cardiac murmur, unspecified: R01.1

## 2011-10-22 NOTE — Discharge Instructions (Signed)
Pregnancy - Third Trimester °The third trimester of pregnancy (the last 3 months) is a period of the most rapid growth for you and your baby. The baby approaches a length of 20 inches and a weight of 6 to 10 pounds. The baby is adding on fat and getting ready for life outside your body. While inside, babies have periods of sleeping and waking, suck their thumbs, and hiccups. You can often feel small contractions of the uterus. This is false labor. It is also called Braxton-Hicks contractions. This is like a practice for labor. The usual problems in this stage of pregnancy include more difficulty breathing, swelling of the hands and feet from water retention, and having to urinate more often because of the uterus and baby pressing on your bladder.  °PRENATAL EXAMS °· Blood work may continue to be done during prenatal exams. These tests are done to check on your health and the probable health of your baby. Blood work is used to follow your blood levels (hemoglobin). Anemia (low hemoglobin) is common during pregnancy. Iron and vitamins are given to help prevent this. You may also continue to be checked for diabetes. Some of the past blood tests may be done again.  °· The size of the uterus is measured during each visit. This makes sure your baby is growing properly according to your pregnancy dates.  °· Your blood pressure is checked every prenatal visit. This is to make sure you are not getting toxemia.  °· Your urine is checked every prenatal visit for infection, diabetes and protein.  °· Your weight is checked at each visit. This is done to make sure gains are happening at the suggested rate and that you and your baby are growing normally.  °· Sometimes, an ultrasound is performed to confirm the position and the proper growth and development of the baby. This is a test done that bounces harmless sound waves off the baby so your caregiver can more accurately determine due dates.  °· Discuss the type of pain  medication and anesthesia you will have during your labor and delivery.  °· Discuss the possibility and anesthesia if a Cesarean Section might be necessary.  °· Inform your caregiver if there is any mental or physical violence at home.  °Sometimes, a specialized non-stress test, contraction stress test and biophysical profile are done to make sure the baby is not having a problem. Checking the amniotic fluid surrounding the baby is called an amniocentesis. The amniotic fluid is removed by sticking a needle into the belly (abdomen). This is sometimes done near the end of pregnancy if an early delivery is required. In this case, it is done to help make sure the baby's lungs are mature enough for the baby to live outside of the womb. If the lungs are not mature and it is unsafe to deliver the baby, an injection of cortisone medication is given to the mother 1 to 2 days before the delivery. This helps the baby's lungs mature and makes it safer to deliver the baby. °CHANGES OCCURING IN THE THIRD TRIMESTER OF PREGNANCY °Your body goes through many changes during pregnancy. They vary from person to person. Talk to your caregiver about changes you notice and are concerned about. °· During the last trimester, you have probably had an increase in your appetite. It is normal to have cravings for certain foods. This varies from person to person and pregnancy to pregnancy.  °· You may begin to get stretch marks on your hips,   abdomen, and breasts. These are normal changes in the body during pregnancy. There are no exercises or medications to take which prevent this change.  °· Constipation may be treated with a stool softener or adding bulk to your diet. Drinking lots of fluids, fiber in vegetables, fruits, and whole grains are helpful.  °· Exercising is also helpful. If you have been very active up until your pregnancy, most of these activities can be continued during your pregnancy. If you have been less active, it is helpful  to start an exercise program such as walking. Consult your caregiver before starting exercise programs.  °· Avoid all smoking, alcohol, un-prescribed drugs, herbs and "street drugs" during your pregnancy. These chemicals affect the formation and growth of the baby. Avoid chemicals throughout the pregnancy to ensure the delivery of a healthy infant.  °· Backache, varicose veins and hemorrhoids may develop or get worse.  °· You will tire more easily in the third trimester, which is normal.  °· The baby's movements may be stronger and more often.  °· You may become short of breath easily.  °· Your belly button may stick out.  °· A yellow discharge may leak from your breasts called colostrum.  °· You may have a bloody mucus discharge. This usually occurs a few days to a week before labor begins.  °HOME CARE INSTRUCTIONS  °· Keep your caregiver's appointments. Follow your caregiver's instructions regarding medication use, exercise, and diet.  °· During pregnancy, you are providing food for you and your baby. Continue to eat regular, well-balanced meals. Choose foods such as meat, fish, milk and other low fat dairy products, vegetables, fruits, and whole-grain breads and cereals. Your caregiver will tell you of the ideal weight gain.  °· A physical sexual relationship may be continued throughout pregnancy if there are no other problems such as early (premature) leaking of amniotic fluid from the membranes, vaginal bleeding, or belly (abdominal) pain.  °· Exercise regularly if there are no restrictions. Check with your caregiver if you are unsure of the safety of your exercises. Greater weight gain will occur in the last 2 trimesters of pregnancy. Exercising helps:  °· Control your weight.  °· Get you in shape for labor and delivery.  °· You lose weight after you deliver.  °· Rest a lot with legs elevated, or as needed for leg cramps or low back pain.  °· Wear a good support or jogging bra for breast tenderness during  pregnancy. This may help if worn during sleep. Pads or tissues may be used in the bra if you are leaking colostrum.  °· Do not use hot tubs, steam rooms, or saunas.  °· Wear your seat belt when driving. This protects you and your baby if you are in an accident.  °· Avoid raw meat, cat litter boxes and soil used by cats. These carry germs that can cause birth defects in the baby.  °· It is easier to loose urine during pregnancy. Tightening up and strengthening the pelvic muscles will help with this problem. You can practice stopping your urination while you are going to the bathroom. These are the same muscles you need to strengthen. It is also the muscles you would use if you were trying to stop from passing gas. You can practice tightening these muscles up 10 times a set and repeating this about 3 times per day. Once you know what muscles to tighten up, do not perform these exercises during urination. It is more likely   to cause an infection by backing up the urine.  °· Ask for help if you have financial, counseling or nutritional needs during pregnancy. Your caregiver will be able to offer counseling for these needs as well as refer you for other special needs.  °· Make a list of emergency phone numbers and have them available.  °· Plan on getting help from family or friends when you go home from the hospital.  °· Make a trial run to the hospital.  °· Take prenatal classes with the father to understand, practice and ask questions about the labor and delivery.  °· Prepare the baby's room/nursery.  °· Do not travel out of the city unless it is absolutely necessary and with the advice of your caregiver.  °· Wear only low or no heal shoes to have better balance and prevent falling.  °MEDICATIONS AND DRUG USE IN PREGNANCY °· Take prenatal vitamins as directed. The vitamin should contain 1 milligram of folic acid. Keep all vitamins out of reach of children. Only a couple vitamins or tablets containing iron may be fatal  to a baby or young child when ingested.  °· Avoid use of all medications, including herbs, over-the-counter medications, not prescribed or suggested by your caregiver. Only take over-the-counter or prescription medicines for pain, discomfort, or fever as directed by your caregiver. Do not use aspirin, ibuprofen (Motrin®, Advil®, Nuprin®) or naproxen (Aleve®) unless OK'd by your caregiver.  °· Let your caregiver also know about herbs you may be using.  °· Alcohol is related to a number of birth defects. This includes fetal alcohol syndrome. All alcohol, in any form, should be avoided completely. Smoking will cause low birth rate and premature babies.  °· Street/illegal drugs are very harmful to the baby. They are absolutely forbidden. A baby born to an addicted mother will be addicted at birth. The baby will go through the same withdrawal an adult does.  °SEEK MEDICAL CARE IF: °You have any concerns or worries during your pregnancy. It is better to call with your questions if you feel they cannot wait, rather than worry about them. °DECISIONS ABOUT CIRCUMCISION °You may or may not know the sex of your baby. If you know your baby is a boy, it may be time to think about circumcision. Circumcision is the removal of the foreskin of the penis. This is the skin that covers the sensitive end of the penis. There is no proven medical need for this. Often this decision is made on what is popular at the time or based upon religious beliefs and social issues. You can discuss these issues with your caregiver or pediatrician. °SEEK IMMEDIATE MEDICAL CARE IF:  °· An unexplained oral temperature above 102° F (38.9° C) develops, or as your caregiver suggests.  °· You have leaking of fluid from the vagina (birth canal). If leaking membranes are suspected, take your temperature and tell your caregiver of this when you call.  °· There is vaginal spotting, bleeding or passing clots. Tell your caregiver of the amount and how many pads are  used.  °· You develop a bad smelling vaginal discharge with a change in the color from clear to white.  °· You develop vomiting that lasts more than 24 hours.  °· You develop chills or fever.  °· You develop shortness of breath.  °· You develop burning on urination.  °· You loose more than 2 pounds of weight or gain more than 2 pounds of weight or as suggested by your   caregiver.  °· You notice sudden swelling of your face, hands, and feet or legs.  °· You develop belly (abdominal) pain. Round ligament discomfort is a common non-cancerous (benign) cause of abdominal pain in pregnancy. Your caregiver still must evaluate you.  °· You develop a severe headache that does not go away.  °· You develop visual problems, blurred or double vision.  °· If you have not felt your baby move for more than 1 hour. If you think the baby is not moving as much as usual, eat something with sugar in it and lie down on your left side for an hour. The baby should move at least 4 to 5 times per hour. Call right away if your baby moves less than that.  °· You fall, are in a car accident or any kind of trauma.  °· There is mental or physical violence at home.  °· Keep appointment already scheduled °Document Released: 07/02/2001 Document Revised: 06/27/2011 Document Reviewed: 01/04/2009 °[employer]® Patient Information ©2012 [employer]. °

## 2011-10-24 ENCOUNTER — Encounter (HOSPITAL_COMMUNITY): Payer: Self-pay | Admitting: *Deleted

## 2011-10-24 ENCOUNTER — Telehealth (HOSPITAL_COMMUNITY): Payer: Self-pay | Admitting: *Deleted

## 2011-10-24 NOTE — Telephone Encounter (Signed)
Preadmission screen  

## 2011-10-27 ENCOUNTER — Encounter (HOSPITAL_COMMUNITY): Payer: Self-pay

## 2011-10-27 ENCOUNTER — Inpatient Hospital Stay (HOSPITAL_COMMUNITY)
Admission: RE | Admit: 2011-10-27 | Discharge: 2011-10-29 | DRG: 775 | Disposition: A | Discharge status: Home / Self Care | Payer: Medicaid Other | Source: Ambulatory Visit | Attending: Obstetrics | Admitting: Obstetrics

## 2011-10-27 ENCOUNTER — Encounter (HOSPITAL_COMMUNITY): Payer: Self-pay | Admitting: Anesthesiology

## 2011-10-27 ENCOUNTER — Inpatient Hospital Stay (HOSPITAL_COMMUNITY): Payer: Medicaid Other | Admitting: Anesthesiology

## 2011-10-27 DIAGNOSIS — O219 Vomiting of pregnancy, unspecified: Secondary | ICD-10-CM

## 2011-10-27 LAB — CBC
MCH: 25.8 pg — ABNORMAL LOW (ref 26.0–34.0)
MCHC: 31 g/dL (ref 30.0–36.0)
Platelets: 291 10*3/uL (ref 150–400)
RBC: 3.29 MIL/uL — ABNORMAL LOW (ref 3.87–5.11)

## 2011-10-27 LAB — RPR: RPR Ser Ql: NONREACTIVE

## 2011-10-27 MED ORDER — FLEET ENEMA 7-19 GM/118ML RE ENEM: 1.0000 | ENEMA | RECTAL | Status: DC | PRN

## 2011-10-27 MED ORDER — PHENYLEPHRINE 40 MCG/ML (10ML) SYRINGE FOR IV PUSH (FOR BLOOD PRESSURE SUPPORT): 80.0000 ug | PREFILLED_SYRINGE | INTRAVENOUS | Status: DC | PRN

## 2011-10-27 MED ORDER — FENTANYL 2.5 MCG/ML BUPIVACAINE 1/10 % EPIDURAL INFUSION (WH - ANES)
14.0000 mL/h | INTRAMUSCULAR | Status: DC
Start: 2011-10-27 — End: 2011-10-27
  Administered 2011-10-27 (×2): 14 mL/h via EPIDURAL
  Filled 2011-10-27 (×3): qty 60

## 2011-10-27 MED ORDER — OXYTOCIN 20 UNITS IN LACTATED RINGERS INFUSION - SIMPLE
1.0000 m[IU]/min | INTRAVENOUS | Status: DC
  Administered 2011-10-27: 1 m[IU]/min via INTRAVENOUS
  Filled 2011-10-27: qty 1000

## 2011-10-27 MED ORDER — IBUPROFEN 600 MG PO TABS: 600.0000 mg | ORAL_TABLET | Freq: Four times a day (QID) | ORAL | Status: DC | PRN

## 2011-10-27 MED ORDER — CITRIC ACID-SODIUM CITRATE 334-500 MG/5ML PO SOLN: 30.0000 mL | ORAL | Status: DC | PRN

## 2011-10-27 MED ORDER — FENTANYL 2.5 MCG/ML BUPIVACAINE 1/10 % EPIDURAL INFUSION (WH - ANES)
INTRAMUSCULAR | Status: DC | PRN
  Administered 2011-10-27: 14 mL/h via EPIDURAL

## 2011-10-27 MED ORDER — OXYTOCIN BOLUS FROM INFUSION
500.0000 mL | Freq: Once | INTRAVENOUS | Status: DC
  Filled 2011-10-27: qty 500

## 2011-10-27 MED ORDER — LACTATED RINGERS IV SOLN
500.0000 mL | Freq: Once | INTRAVENOUS | Status: AC
  Administered 2011-10-27: 500 mL via INTRAVENOUS

## 2011-10-27 MED ORDER — LACTATED RINGERS IV SOLN
INTRAVENOUS | Status: DC
  Administered 2011-10-27 (×3): via INTRAVENOUS

## 2011-10-27 MED ORDER — ONDANSETRON HCL 4 MG/2ML IJ SOLN: 4.0000 mg | Freq: Four times a day (QID) | INTRAMUSCULAR | Status: DC | PRN

## 2011-10-27 MED ORDER — BUTORPHANOL TARTRATE 2 MG/ML IJ SOLN: 1.0000 mg | INTRAMUSCULAR | Status: DC | PRN

## 2011-10-27 MED ORDER — LIDOCAINE HCL (PF) 1 % IJ SOLN
30.0000 mL | INTRAMUSCULAR | Status: DC | PRN
  Filled 2011-10-27: qty 30

## 2011-10-27 MED ORDER — EPHEDRINE 5 MG/ML INJ
10.0000 mg | INTRAVENOUS | Status: DC | PRN
  Administered 2011-10-27: 10 mg via INTRAVENOUS

## 2011-10-27 MED ORDER — OXYTOCIN 20 UNITS IN LACTATED RINGERS INFUSION - SIMPLE: 125.0000 mL/h | Freq: Once | INTRAVENOUS | Status: DC

## 2011-10-27 MED ORDER — ACETAMINOPHEN 325 MG PO TABS: 650.0000 mg | ORAL_TABLET | ORAL | Status: DC | PRN

## 2011-10-27 MED ORDER — EPHEDRINE 5 MG/ML INJ
10.0000 mg | INTRAVENOUS | Status: DC | PRN
  Filled 2011-10-27: qty 4

## 2011-10-27 MED ORDER — PHENYLEPHRINE 40 MCG/ML (10ML) SYRINGE FOR IV PUSH (FOR BLOOD PRESSURE SUPPORT)
80.0000 ug | PREFILLED_SYRINGE | INTRAVENOUS | Status: DC | PRN
  Filled 2011-10-27: qty 5

## 2011-10-27 MED ORDER — OXYCODONE-ACETAMINOPHEN 5-325 MG PO TABS
1.0000 | ORAL_TABLET | ORAL | Status: DC | PRN
  Administered 2011-10-28 – 2011-10-29 (×2): 1 via ORAL
  Filled 2011-10-27 (×2): qty 1

## 2011-10-27 MED ORDER — LACTATED RINGERS IV SOLN
500.0000 mL | INTRAVENOUS | Status: DC | PRN
  Administered 2011-10-27: 500 mL via INTRAVENOUS

## 2011-10-27 MED ORDER — DIPHENHYDRAMINE HCL 50 MG/ML IJ SOLN: 12.5000 mg | INTRAMUSCULAR | Status: DC | PRN

## 2011-10-27 MED ORDER — TERBUTALINE SULFATE 1 MG/ML IJ SOLN: 0.2500 mg | Freq: Once | INTRAMUSCULAR | Status: DC | PRN

## 2011-10-27 MED ORDER — LIDOCAINE HCL (PF) 1 % IJ SOLN
INTRAMUSCULAR | Status: DC | PRN
  Administered 2011-10-27 (×2): 8 mL

## 2011-10-27 NOTE — Progress Notes (Addendum)
RN notified Dr. Gaynell Face of 8cm dilation and repetitive variables with contractions. No order given, will continue to monitor.

## 2011-10-27 NOTE — Anesthesia Preprocedure Evaluation (Signed)
Anesthesia Evaluation  Patient identified by MRN, date of birth, ID band Patient awake    Reviewed: Allergy & Precautions, H&P , Patient's Chart, lab work & pertinent test results  Airway Mallampati: I TM Distance: >3 FB Neck ROM: full    Dental No notable dental hx.    Pulmonary neg pulmonary ROS,    Pulmonary exam normal       Cardiovascular negative cardio ROS      Neuro/Psych negative neurological ROS  negative psych ROS   GI/Hepatic negative GI ROS, Neg liver ROS,   Endo/Other  negative endocrine ROS  Renal/GU negative Renal ROS  negative genitourinary   Musculoskeletal negative musculoskeletal ROS (+)   Abdominal Normal abdominal exam  (+)   Peds  Hematology negative hematology ROS (+)   Anesthesia Other Findings   Reproductive/Obstetrics (+) Pregnancy                           Anesthesia Physical Anesthesia Plan  ASA: II  Anesthesia Plan: Epidural   Post-op Pain Management:    Induction:   Airway Management Planned:   Additional Equipment:   Intra-op Plan:   Post-operative Plan:   Informed Consent: I have reviewed the patients History and Physical, chart, labs and discussed the procedure including the risks, benefits and alternatives for the proposed anesthesia with the patient or authorized representative who has indicated his/her understanding and acceptance.     Plan Discussed with:   Anesthesia Plan Comments:         Anesthesia Quick Evaluation

## 2011-10-27 NOTE — Progress Notes (Signed)
This note also relates to the following rows which could not be included: BP - Cannot attach notes to unvalidated device data Pulse Rate - Cannot attach notes to unvalidated device data   Pushing with contractions.

## 2011-10-27 NOTE — Progress Notes (Signed)
Holding patient for mother baby report.

## 2011-10-27 NOTE — H&P (Signed)
This is Dr. Francoise Ceo dictating the history and physical on  Tara [name] she's a 26 year old gravida 4 para 1021 at 39+ weeks her EDC is 10/31/2011 and desired induction negative GBS cervix is 4 cm 80% vertex -1 amniotomy was performed the fluid was clear she is on low-dose Pitocin Past medical history negative Past surgical history negative Social history noncontributory System review negative Physical exam is a well-developed female in in in labor HEENT negative Breasts negative Heart regular rhythm no murmurs no gallops Lungs clear Abdomen term Pelvic as described above Extremities negative

## 2011-10-27 NOTE — Anesthesia Procedure Notes (Signed)
Epidural Patient location during procedure: OB Start time: 10/27/2011 11:16 AM End time: 10/27/2011 11:20 AM Reason for block: procedure for pain  Staffing Anesthesiologist: Sandrea Hughs Performed by: anesthesiologist   Preanesthetic Checklist Completed: patient identified, site marked, surgical consent, pre-op evaluation, timeout performed, IV checked, risks and benefits discussed and monitors and equipment checked  Epidural Patient position: sitting Prep: site prepped and draped and DuraPrep Patient monitoring: continuous pulse ox and blood pressure Approach: midline Injection technique: LOR air  Needle:  Needle type: Tuohy  Needle gauge: 17 G Needle length: 9 cm Needle insertion depth: 4 cm Catheter type: closed end flexible Catheter size: 19 Gauge Catheter at skin depth: 9 cm Test dose: negative and Other  Assessment Sensory level: T8 Events: blood not aspirated, injection not painful, no injection resistance, negative IV test and no paresthesia

## 2011-10-28 LAB — CBC
Hemoglobin: 7.3 g/dL — ABNORMAL LOW (ref 12.0–15.0)
RBC: 2.85 MIL/uL — ABNORMAL LOW (ref 3.87–5.11)

## 2011-10-28 MED ORDER — LANOLIN HYDROUS EX OINT: TOPICAL_OINTMENT | CUTANEOUS | Status: DC | PRN

## 2011-10-28 MED ORDER — RHO D IMMUNE GLOBULIN 1500 UNIT/2ML IJ SOLN
300.0000 ug | Freq: Once | INTRAMUSCULAR | Status: DC
  Filled 2011-10-28: qty 2

## 2011-10-28 MED ORDER — SIMETHICONE 80 MG PO CHEW: 80.0000 mg | CHEWABLE_TABLET | ORAL | Status: DC | PRN

## 2011-10-28 MED ORDER — DIBUCAINE 1 % RE OINT: 1.0000 "application " | TOPICAL_OINTMENT | RECTAL | Status: DC | PRN

## 2011-10-28 MED ORDER — PRENATAL MULTIVITAMIN CH
1.0000 | ORAL_TABLET | Freq: Every day | ORAL | Status: DC
  Administered 2011-10-28: 1 via ORAL
  Filled 2011-10-28: qty 1

## 2011-10-28 MED ORDER — BENZOCAINE-MENTHOL 20-0.5 % EX AERO
1.0000 "application " | INHALATION_SPRAY | CUTANEOUS | Status: DC | PRN
  Administered 2011-10-28: 1 via TOPICAL

## 2011-10-28 MED ORDER — SENNOSIDES-DOCUSATE SODIUM 8.6-50 MG PO TABS
2.0000 | ORAL_TABLET | Freq: Every day | ORAL | Status: DC
  Administered 2011-10-28: 2 via ORAL

## 2011-10-28 MED ORDER — WITCH HAZEL-GLYCERIN EX PADS: 1.0000 "application " | MEDICATED_PAD | CUTANEOUS | Status: DC | PRN

## 2011-10-28 MED ORDER — ZOLPIDEM TARTRATE 5 MG PO TABS: 5.0000 mg | ORAL_TABLET | Freq: Every evening | ORAL | Status: DC | PRN

## 2011-10-28 MED ORDER — ONDANSETRON HCL 4 MG/2ML IJ SOLN: 4.0000 mg | INTRAMUSCULAR | Status: DC | PRN

## 2011-10-28 MED ORDER — BENZOCAINE-MENTHOL 20-0.5 % EX AERO
INHALATION_SPRAY | CUTANEOUS | Status: AC
  Filled 2011-10-28: qty 56

## 2011-10-28 MED ORDER — RHO D IMMUNE GLOBULIN 1500 UNIT/2ML IJ SOLN
300.0000 ug | Freq: Once | INTRAMUSCULAR | Status: AC
  Administered 2011-10-28: 300 ug via INTRAMUSCULAR
  Filled 2011-10-28: qty 2

## 2011-10-28 MED ORDER — ONDANSETRON HCL 4 MG PO TABS: 4.0000 mg | ORAL_TABLET | ORAL | Status: DC | PRN

## 2011-10-28 MED ORDER — FERROUS SULFATE 325 (65 FE) MG PO TABS
325.0000 mg | ORAL_TABLET | Freq: Two times a day (BID) | ORAL | Status: DC
  Administered 2011-10-28 – 2011-10-29 (×3): 325 mg via ORAL
  Filled 2011-10-28 (×3): qty 1

## 2011-10-28 MED ORDER — TETANUS-DIPHTH-ACELL PERTUSSIS 5-2.5-18.5 LF-MCG/0.5 IM SUSP
0.5000 mL | Freq: Once | INTRAMUSCULAR | Status: AC
  Administered 2011-10-28: 0.5 mL via INTRAMUSCULAR
  Filled 2011-10-28: qty 0.5

## 2011-10-28 MED ORDER — IBUPROFEN 600 MG PO TABS
600.0000 mg | ORAL_TABLET | Freq: Four times a day (QID) | ORAL | Status: DC
  Administered 2011-10-28 – 2011-10-29 (×5): 600 mg via ORAL
  Filled 2011-10-28 (×5): qty 1

## 2011-10-28 MED ORDER — DIPHENHYDRAMINE HCL 25 MG PO CAPS: 25.0000 mg | ORAL_CAPSULE | Freq: Four times a day (QID) | ORAL | Status: DC | PRN

## 2011-10-28 MED ORDER — OXYCODONE-ACETAMINOPHEN 5-325 MG PO TABS
1.0000 | ORAL_TABLET | ORAL | Status: DC | PRN
  Administered 2011-10-28: 1 via ORAL
  Filled 2011-10-28: qty 1

## 2011-10-28 NOTE — Progress Notes (Signed)
Patient ID: Tara [name], female   DOB: 1985-07-26, 26 y.o.   MRN: [id number] Postpartum day one Vital signs normal Fundus firm Lochia moderate Legs negative

## 2011-10-28 NOTE — Progress Notes (Signed)
UR chart review completed.  

## 2011-10-28 NOTE — Anesthesia Postprocedure Evaluation (Signed)
  Anesthesia Post Note  Patient: Tara [name]  Procedure(s) Performed: * No procedures listed *  Anesthesia type: Epidural  Patient location: Mother/Baby  Post pain: Pain level controlled  Post assessment: Post-op Vital signs reviewed  Last Vitals:  Filed Vitals:   10/28/11 1140  BP: 96/59  Pulse: 87  Temp: 36.6 C  Resp: 16    Post vital signs: Reviewed  Level of consciousness:alert  Complications: No apparent anesthesia complications

## 2011-10-29 ENCOUNTER — Encounter (HOSPITAL_COMMUNITY): Payer: Self-pay

## 2011-10-29 MED ORDER — PRENATAL MULTIVITAMIN CH: 1.0000 | ORAL_TABLET | Freq: Every day | ORAL | Status: DC

## 2011-10-29 NOTE — Discharge Summary (Signed)
Obstetric Discharge Summary Reason for Admission: onset of labor Prenatal Procedures: none Intrapartum Procedures: spontaneous vaginal delivery Postpartum Procedures: none Complications-Operative and Postpartum: none Hemoglobin  Date Value Range Status  10/28/2011 7.3* 12.0-15.0 (g/dL) Final     HCT  Date Value Range Status  10/28/2011 23.8* 36.0-46.0 (%) Final    Physical Exam:  General: alert Lochia: appropriate Uterine Fundus: firm Incision: healing well DVT Evaluation: No evidence of DVT seen on physical exam.  Discharge Diagnoses: Term Pregnancy-delivered  Discharge Information: Date: 10/29/2011 Activity: pelvic rest Diet: routine Medications: Percocet Condition: stable Instructions: refer to practice specific booklet Discharge to: home Follow-up Information    Follow up with Brianne Maina A, MD in 6 weeks.   Contact information:   [address] [place] [address] [phone]          Newborn Data: Live born female  Birth Weight: 5 lb 15.6 oz (2710 g) APGAR: 9, 10  Home with mother.  Tara [name] A 10/29/2011, 7:39 AM

## 2011-10-29 NOTE — Discharge Instructions (Signed)
Discharge instructions   You can wash your hair  Shower  Eat what you want  Drink what you want  See me in 6 weeks  Your ankles are going to swell more in the next 2 weeks than when pregnant  No sex for 6 weeks   Daniela Hernan A, MD 10/29/2011    

## 2011-10-30 LAB — RH IG WORKUP (INCLUDES ABO/RH)
ABO/RH(D): O NEG
Gestational Age(Wks): 39

## 2011-12-20 ENCOUNTER — Encounter (HOSPITAL_COMMUNITY): Payer: Self-pay | Admitting: Pharmacist

## 2011-12-25 ENCOUNTER — Other Ambulatory Visit: Payer: Self-pay | Admitting: Obstetrics

## 2011-12-26 ENCOUNTER — Other Ambulatory Visit: Payer: Self-pay | Admitting: Obstetrics

## 2011-12-30 ENCOUNTER — Encounter (HOSPITAL_COMMUNITY): Payer: Self-pay

## 2011-12-30 ENCOUNTER — Encounter (HOSPITAL_COMMUNITY)
Admission: RE | Admit: 2011-12-30 | Discharge: 2011-12-30 | Disposition: A | Discharge status: Home / Self Care | Payer: Medicaid Other | Source: Ambulatory Visit | Attending: Obstetrics | Admitting: Obstetrics

## 2011-12-30 HISTORY — DX: Headache: R51

## 2011-12-30 LAB — CBC
MCHC: 30.7 g/dL (ref 30.0–36.0)
RDW: 16.1 % — ABNORMAL HIGH (ref 11.5–15.5)

## 2011-12-30 LAB — DIFFERENTIAL
Basophils Absolute: 0 10*3/uL (ref 0.0–0.1)
Basophils Relative: 0 % (ref 0–1)
Neutro Abs: 3.5 10*3/uL (ref 1.7–7.7)
Neutrophils Relative %: 57 % (ref 43–77)

## 2011-12-30 LAB — SURGICAL PCR SCREEN
MRSA, PCR: NEGATIVE
Staphylococcus aureus: NEGATIVE

## 2011-12-30 NOTE — Patient Instructions (Addendum)
YOUR PROCEDURE IS SCHEDULED ON:01/09/12  ENTER THROUGH THE MAIN ENTRANCE OF [facility] AT:6am  USE DESK PHONE AND DIAL [phone] TO INFORM us OF YOUR ARRIVAL  CALL [phone] IF YOU HAVE ANY QUESTIONS OR PROBLEMS PRIOR TO YOUR ARRIVAL.  REMEMBER: DO NOT EAT OR DRINK AFTER MIDNIGHT :Wed    YOU MAY BRUSH YOUR TEETH THE MORNING OF SURGERY   TAKE THESE MEDICINES THE DAY OF SURGERY WITH SIP OF WATER:none  DO NOT WEAR JEWELRY, EYE MAKEUP, LIPSTICK OR DARK FINGERNAIL POLISH DO NOT WEAR LOTIONS  DO NOT SHAVE FOR 48 HOURS PRIOR TO SURGERY  YOU WILL NOT BE ALLOWED TO DRIVE YOURSELF HOME.  NAME OF DRIVER: Mother- Barbara Cower

## 2012-01-07 NOTE — H&P (Signed)
Tara [name], Tara [name]             ACCOUNT NO.:  [id number]  MEDICAL RECORD NO.:  [id number]  LOCATION:  [facility]                           FACILITY:  [facility]  PHYSICIAN:  Kathreen Cosier, M.D.DATE OF BIRTH:  08-Jul-1986  DATE OF ADMISSION:  12/30/2011 DATE OF DISCHARGE:  12/30/2011                             HISTORY & PHYSICAL   HISTORY OF PRESENT ILLNESS:  The patient is a 26 year old gravida 4, para 2-0-2-2, who had a normal vaginal delivery on October 27, 2011, and desires tubal ligation.  She understands the procedure permanent and can result in failure resulting in pregnancy in tube or uterus.  PAST MEDICAL HISTORY:  Negative.  PAST SURGICAL HISTORY:  Negative.  SYSTEM REVIEW:  Negative.  PHYSICAL EXAMINATION:  GENERAL:  Well-developed female in no distress. HEENT:  Negative. LUNGS:  Clear to P and A. HEART:  Regular rhythm.  No murmurs, no rubs. BREASTS:  No masses. ABDOMEN:  Not distended, nontender.  No masses.  Bimanual exam revealed uterus of normal size.  Negative adnexa.  Vagina and perineum were normal. EXTREMITIES:  Normal.          ______________________________ Kathreen Cosier, M.D.     BAM/MEDQ  D:  01/07/2012  T:  01/07/2012  Job:  [id number]

## 2012-01-09 ENCOUNTER — Encounter (HOSPITAL_COMMUNITY)
Admission: RE | Disposition: A | Discharge status: Home / Self Care | Payer: Self-pay | Source: Ambulatory Visit | Attending: Obstetrics

## 2012-01-09 ENCOUNTER — Encounter (HOSPITAL_COMMUNITY): Payer: Self-pay | Admitting: *Deleted

## 2012-01-09 ENCOUNTER — Ambulatory Visit (HOSPITAL_COMMUNITY)
Admission: RE | Admit: 2012-01-09 | Discharge: 2012-01-09 | Disposition: A | Discharge status: Home / Self Care | Payer: Medicaid Other | Source: Ambulatory Visit | Attending: Obstetrics | Admitting: Obstetrics

## 2012-01-09 ENCOUNTER — Ambulatory Visit (HOSPITAL_COMMUNITY): Payer: Medicaid Other | Admitting: Anesthesiology

## 2012-01-09 ENCOUNTER — Encounter (HOSPITAL_COMMUNITY): Payer: Self-pay | Admitting: Anesthesiology

## 2012-01-09 DIAGNOSIS — O219 Vomiting of pregnancy, unspecified: Secondary | ICD-10-CM

## 2012-01-09 DIAGNOSIS — Z641 Problems related to multiparity: Secondary | ICD-10-CM | POA: Insufficient documentation

## 2012-01-09 DIAGNOSIS — Z302 Encounter for sterilization: Secondary | ICD-10-CM | POA: Insufficient documentation

## 2012-01-09 HISTORY — PX: LAPAROSCOPIC TUBAL LIGATION: SHX1937

## 2012-01-09 SURGERY — LIGATION, FALLOPIAN TUBE, LAPAROSCOPIC
Anesthesia: General | Site: Abdomen | Laterality: Bilateral | Wound class: Clean Contaminated

## 2012-01-09 MED ORDER — LIDOCAINE HCL (CARDIAC) 20 MG/ML IV SOLN
INTRAVENOUS | Status: DC | PRN
  Administered 2012-01-09: 60 mg via INTRAVENOUS

## 2012-01-09 MED ORDER — DEXAMETHASONE SODIUM PHOSPHATE 10 MG/ML IJ SOLN
INTRAMUSCULAR | Status: AC
  Filled 2012-01-09: qty 1

## 2012-01-09 MED ORDER — FENTANYL CITRATE 0.05 MG/ML IJ SOLN
25.0000 ug | INTRAMUSCULAR | Status: DC | PRN
  Administered 2012-01-09: 50 ug via INTRAVENOUS

## 2012-01-09 MED ORDER — MEPERIDINE HCL 25 MG/ML IJ SOLN: 6.2500 mg | INTRAMUSCULAR | Status: DC | PRN

## 2012-01-09 MED ORDER — METOCLOPRAMIDE HCL 5 MG/ML IJ SOLN: 10.0000 mg | Freq: Once | INTRAMUSCULAR | Status: DC | PRN

## 2012-01-09 MED ORDER — ONDANSETRON HCL 4 MG/2ML IJ SOLN
INTRAMUSCULAR | Status: AC
  Filled 2012-01-09: qty 2

## 2012-01-09 MED ORDER — GLYCOPYRROLATE 0.2 MG/ML IJ SOLN
INTRAMUSCULAR | Status: AC
  Filled 2012-01-09: qty 2

## 2012-01-09 MED ORDER — KETOROLAC TROMETHAMINE 60 MG/2ML IM SOLN
INTRAMUSCULAR | Status: AC
  Filled 2012-01-09: qty 2

## 2012-01-09 MED ORDER — FENTANYL CITRATE 0.05 MG/ML IJ SOLN
INTRAMUSCULAR | Status: AC
  Filled 2012-01-09: qty 5

## 2012-01-09 MED ORDER — PROPOFOL 10 MG/ML IV EMUL
INTRAVENOUS | Status: DC | PRN
  Administered 2012-01-09: 150 mg via INTRAVENOUS

## 2012-01-09 MED ORDER — MIDAZOLAM HCL 5 MG/5ML IJ SOLN
INTRAMUSCULAR | Status: DC | PRN
  Administered 2012-01-09: 2 mg via INTRAVENOUS

## 2012-01-09 MED ORDER — NEOSTIGMINE METHYLSULFATE 1 MG/ML IJ SOLN
INTRAMUSCULAR | Status: DC | PRN
  Administered 2012-01-09: 4 mg via INTRAVENOUS

## 2012-01-09 MED ORDER — ROCURONIUM BROMIDE 100 MG/10ML IV SOLN
INTRAVENOUS | Status: DC | PRN
  Administered 2012-01-09: 30 mg via INTRAVENOUS

## 2012-01-09 MED ORDER — KETOROLAC TROMETHAMINE 60 MG/2ML IM SOLN
INTRAMUSCULAR | Status: DC | PRN
  Administered 2012-01-09: 30 mg via INTRAMUSCULAR

## 2012-01-09 MED ORDER — ROCURONIUM BROMIDE 50 MG/5ML IV SOLN
INTRAVENOUS | Status: AC
  Filled 2012-01-09: qty 1

## 2012-01-09 MED ORDER — FENTANYL CITRATE 0.05 MG/ML IJ SOLN
INTRAMUSCULAR | Status: AC
Start: 2012-01-09 — End: 2012-01-09
  Administered 2012-01-09: 50 ug via INTRAVENOUS
  Filled 2012-01-09: qty 2

## 2012-01-09 MED ORDER — ONDANSETRON HCL 4 MG/2ML IJ SOLN
INTRAMUSCULAR | Status: DC | PRN
  Administered 2012-01-09: 4 mg via INTRAVENOUS

## 2012-01-09 MED ORDER — FENTANYL CITRATE 0.05 MG/ML IJ SOLN
INTRAMUSCULAR | Status: DC | PRN
  Administered 2012-01-09 (×5): 50 ug via INTRAVENOUS

## 2012-01-09 MED ORDER — MIDAZOLAM HCL 2 MG/2ML IJ SOLN
INTRAMUSCULAR | Status: AC
  Filled 2012-01-09: qty 2

## 2012-01-09 MED ORDER — GLYCOPYRROLATE 0.2 MG/ML IJ SOLN
INTRAMUSCULAR | Status: DC | PRN
  Administered 2012-01-09: 0.1 mg via INTRAVENOUS
  Administered 2012-01-09: .8 mg via INTRAVENOUS

## 2012-01-09 MED ORDER — KETOROLAC TROMETHAMINE 30 MG/ML IJ SOLN
INTRAMUSCULAR | Status: DC | PRN
  Administered 2012-01-09: 30 mg via INTRAVENOUS

## 2012-01-09 MED ORDER — LIDOCAINE HCL (CARDIAC) 20 MG/ML IV SOLN
INTRAVENOUS | Status: AC
  Filled 2012-01-09: qty 5

## 2012-01-09 MED ORDER — NEOSTIGMINE METHYLSULFATE 1 MG/ML IJ SOLN
INTRAMUSCULAR | Status: AC
  Filled 2012-01-09: qty 10

## 2012-01-09 MED ORDER — LACTATED RINGERS IV SOLN
INTRAVENOUS | Status: DC
  Administered 2012-01-09 (×3): via INTRAVENOUS

## 2012-01-09 MED ORDER — DEXAMETHASONE SODIUM PHOSPHATE 10 MG/ML IJ SOLN
INTRAMUSCULAR | Status: DC | PRN
  Administered 2012-01-09: 10 mg via INTRAVENOUS

## 2012-01-09 MED ORDER — PROPOFOL 10 MG/ML IV EMUL
INTRAVENOUS | Status: AC
  Filled 2012-01-09: qty 20

## 2012-01-09 SURGICAL SUPPLY — 13 items
CATH ROBINSON RED A/P 16FR (CATHETERS) ×2 IMPLANT
CLOTH BEACON ORANGE TIMEOUT ST (SAFETY) ×2 IMPLANT
DERMABOND ADVANCED (GAUZE/BANDAGES/DRESSINGS) ×1
DERMABOND ADVANCED .7 DNX12 (GAUZE/BANDAGES/DRESSINGS) ×1 IMPLANT
GLOVE BIO SURGEON STRL SZ8.5 (GLOVE) ×4 IMPLANT
GOWN BRE IMP SLV AUR LG STRL (GOWN DISPOSABLE) ×4 IMPLANT
GOWN PREVENTION PLUS XXLARGE (GOWN DISPOSABLE) ×2 IMPLANT
PACK LAPAROSCOPY BASIN (CUSTOM PROCEDURE TRAY) ×2 IMPLANT
SUT MON AB 4-0 PS1 27 (SUTURE) ×2 IMPLANT
SUT VIC AB 0 CT1 27 (SUTURE) ×1
SUT VIC AB 0 CT1 27XBRD ANBCTR (SUTURE) ×1 IMPLANT
TOWEL OR 17X24 6PK STRL BLUE (TOWEL DISPOSABLE) ×4 IMPLANT
WATER STERILE IRR 1000ML POUR (IV SOLUTION) ×2 IMPLANT

## 2012-01-09 NOTE — Transfer of Care (Signed)
Immediate Anesthesia Transfer of Care Note  Patient: Tara [name]  Procedure(s) Performed: Procedure(s) (LRB): LAPAROSCOPIC TUBAL LIGATION (Bilateral)  Patient Location: PACU  Anesthesia Type: General  Level of Consciousness: awake, alert  and oriented  Airway & Oxygen Therapy: Patient Spontanous Breathing and Patient connected to nasal cannula oxygen  Post-op Assessment: Report given to PACU RN and Post -op Vital signs reviewed and stable  Post vital signs: Reviewed and stable  Complications: No apparent anesthesia complications

## 2012-01-09 NOTE — Op Note (Signed)
preop diagnosis multiparity Postop diagnosis is same  open laparoscopic tubal sterilization Surgeon Dr. Francoise Ceo Procedure on the general anesthesia patient in the lithotomy position abdomen perineum and vagina prepped and draped bladder emptied with a straight catheter Hulka tenaculum was placed the cervix transverse incision made in the umbilicus scattered him to the fascia fascia cleaned grasped to cochleas and the fascia and the peritoneum opened the Mayo scissors the sleeve of the trocar inserted intraperitoneally 3 this carbon dioxide and he was intraperitoneally visit lysing scope inserted through the sleeve of the trocar uterus tubes and ovaries normal cautery probe inserted through the sleeve the scope the right tube was grasped 1 inch from the colon and cauterize the tube was cauterized a total of 4 places moving laterally from the prostatic cautery the procedure was done the exact fashion on that side the pros removed CO2 allowed to escape from the peritoneal cavity the fascia closed one stitch of 0 Vicryl and the skin shows a subcuticular stitch of 4-0 Monocryl patient tolerated procedure well taken to recovery in good condition and

## 2012-01-09 NOTE — Anesthesia Procedure Notes (Signed)
Procedure Name: Intubation Date/Time: 01/09/2012 7:27 AM Performed by: Graciela Husbands Pre-anesthesia Checklist: Emergency Drugs available, Suction available, Patient identified, Timeout performed and Patient being monitored Patient Re-evaluated:Patient Re-evaluated prior to inductionOxygen Delivery Method: Circle system utilized Preoxygenation: Pre-oxygenation with 100% oxygen Intubation Type: IV induction Ventilation: Mask ventilation without difficulty Laryngoscope Size: Mac and 3 Tube type: Oral Tube size: 7.0 mm Number of attempts: 1 Airway Equipment and Method: Stylet Placement Confirmation: ETT inserted through vocal cords under direct vision,  breath sounds checked- equal and bilateral and positive ETCO2 Secured at: 21 cm Tube secured with: Tape Dental Injury: Teeth and Oropharynx as per pre-operative assessment

## 2012-01-09 NOTE — Anesthesia Postprocedure Evaluation (Signed)
Anesthesia Post Note  Patient: Tara [name]  Procedure(s) Performed: Procedure(s) (LRB): LAPAROSCOPIC TUBAL LIGATION (Bilateral)  Anesthesia type: General  Patient location: PACU  Post pain: Pain level controlled  Post assessment: Post-op Vital signs reviewed  Last Vitals:  Filed Vitals:   01/09/12 0930  BP: 95/56  Pulse: 63  Temp:   Resp: 15    Post vital signs: Reviewed  Level of consciousness: sedated  Complications: No apparent anesthesia complications

## 2012-01-09 NOTE — H&P (Signed)
  There has been no change in the patient's history and physical since the original dictation 

## 2012-01-09 NOTE — Discharge Instructions (Signed)

## 2012-01-09 NOTE — Anesthesia Preprocedure Evaluation (Signed)
Anesthesia Evaluation  Patient identified by MRN, date of birth, ID band Patient awake    Reviewed: Allergy & Precautions, H&P , NPO status , Patient's Chart, lab work & pertinent test results  Airway Mallampati: II TM Distance: >3 FB Neck ROM: full    Dental No notable dental hx. (+) Teeth Intact   Pulmonary neg pulmonary ROS,  breath sounds clear to auscultation  Pulmonary exam normal       Cardiovascular negative cardio ROS  + Valvular Problems/Murmurs Rhythm:regular Rate:Normal     Neuro/Psych  Headaches, negative neurological ROS  negative psych ROS   GI/Hepatic negative GI ROS, Neg liver ROS,   Endo/Other  negative endocrine ROS  Renal/GU negative Renal ROS  negative genitourinary   Musculoskeletal   Abdominal Normal abdominal exam  (+)   Peds  Hematology negative hematology ROS (+)   Anesthesia Other Findings   Reproductive/Obstetrics negative OB ROS                           Anesthesia Physical Anesthesia Plan  ASA: I  Anesthesia Plan: General ETT   Post-op Pain Management:    Induction:   Airway Management Planned:   Additional Equipment:   Intra-op Plan:   Post-operative Plan:   Informed Consent: I have reviewed the patients History and Physical, chart, labs and discussed the procedure including the risks, benefits and alternatives for the proposed anesthesia with the patient or authorized representative who has indicated his/her understanding and acceptance.   Dental Advisory Given  Plan Discussed with: Anesthesiologist, Surgeon and CRNA  Anesthesia Plan Comments:         Anesthesia Quick Evaluation

## 2012-01-10 ENCOUNTER — Encounter (HOSPITAL_COMMUNITY): Payer: Self-pay | Admitting: Obstetrics

## 2013-01-19 ENCOUNTER — Inpatient Hospital Stay (HOSPITAL_COMMUNITY)
Admission: AD | Admit: 2013-01-19 | Discharge: 2013-01-19 | Disposition: A | Discharge status: Home / Self Care | Payer: Self-pay | Source: Ambulatory Visit | Attending: Family Medicine | Admitting: Family Medicine

## 2013-01-19 ENCOUNTER — Encounter (HOSPITAL_COMMUNITY): Payer: Self-pay | Admitting: *Deleted

## 2013-01-19 DIAGNOSIS — N390 Urinary tract infection, site not specified: Secondary | ICD-10-CM | POA: Insufficient documentation

## 2013-01-19 DIAGNOSIS — J069 Acute upper respiratory infection, unspecified: Secondary | ICD-10-CM | POA: Insufficient documentation

## 2013-01-19 DIAGNOSIS — R059 Cough, unspecified: Secondary | ICD-10-CM | POA: Insufficient documentation

## 2013-01-19 DIAGNOSIS — R05 Cough: Secondary | ICD-10-CM | POA: Insufficient documentation

## 2013-01-19 DIAGNOSIS — R51 Headache: Secondary | ICD-10-CM | POA: Insufficient documentation

## 2013-01-19 LAB — URINALYSIS, ROUTINE W REFLEX MICROSCOPIC
Bilirubin Urine: NEGATIVE
Ketones, ur: NEGATIVE mg/dL
Leukocytes, UA: NEGATIVE
Nitrite: POSITIVE — AB
Urobilinogen, UA: 4 mg/dL — ABNORMAL HIGH (ref 0.0–1.0)

## 2013-01-19 MED ORDER — SODIUM CHLORIDE 0.9 % IV SOLN
INTRAVENOUS | Status: DC
  Administered 2013-01-19: 22:00:00 via INTRAVENOUS

## 2013-01-19 MED ORDER — TRIAMCINOLONE ACETONIDE(NASAL) 55 MCG/ACT NA INHA: 2.0000 | Freq: Every day | NASAL | Status: DC

## 2013-01-19 MED ORDER — METOCLOPRAMIDE HCL 5 MG/ML IJ SOLN
10.0000 mg | Freq: Once | INTRAMUSCULAR | Status: AC
  Administered 2013-01-19: 10 mg via INTRAVENOUS
  Filled 2013-01-19: qty 2

## 2013-01-19 MED ORDER — BUTALBITAL-APAP-CAFFEINE 50-325-40 MG PO TABS
1.0000 | ORAL_TABLET | Freq: Once | ORAL | Status: AC
  Administered 2013-01-19: 1 via ORAL
  Filled 2013-01-19: qty 1

## 2013-01-19 MED ORDER — DIPHENHYDRAMINE HCL 50 MG/ML IJ SOLN
25.0000 mg | Freq: Once | INTRAMUSCULAR | Status: AC
  Administered 2013-01-19: 25 mg via INTRAVENOUS
  Filled 2013-01-19: qty 1

## 2013-01-19 MED ORDER — DEXAMETHASONE SODIUM PHOSPHATE 10 MG/ML IJ SOLN
10.0000 mg | Freq: Once | INTRAMUSCULAR | Status: AC
  Administered 2013-01-19: 10 mg via INTRAVENOUS
  Filled 2013-01-19: qty 1

## 2013-01-19 MED ORDER — LEVOFLOXACIN 750 MG PO TABS: 750.0000 mg | ORAL_TABLET | Freq: Every day | ORAL | Status: DC

## 2013-01-19 NOTE — MAU Note (Signed)
Pt LMP 6/15, reports productive cough x 1 month, chills and body aches x 1 wk.

## 2013-01-19 NOTE — MAU Note (Signed)
Pt presents with cough and green mucous x 1 month;  body aches and chills over past week after tattoo on back.  Pt states she has lost " a lot" of weight in past  months and has been dealing with teh death of the FOB over past month

## 2013-01-19 NOTE — MAU Provider Note (Signed)
History     CSN: [id number]  Arrival date and time: 01/19/13 2011   First Provider Initiated Contact with Patient 01/19/13 2046      Chief Complaint  Patient presents with  . Cough  . Chills  . Generalized Body Aches   HPI [name] is a 27 y.o. [id number] who presents to MAU today with complaint of headache, cough and body aches. The patient states that she has had a productive cough x 1 month. She states headache and body aches x 1 week. She has a new tattoo on her back since then as well. She denies nasal congestion, sinus pressure, sore throat or sick contacts.    OB History   Grav Para Term Preterm Abortions TAB SAB Ect Mult Living   4 2 2  2 2    2       Past Medical History  Diagnosis Date  . Preterm labor   . Heart murmur     as baby  . Urinary tract infection   . History of gonorrhea   . History of chlamydia   . HYQMVHQI(696.2)     Past Surgical History  Procedure Laterality Date  . Induced abortion    . Dilation and curettage of uterus    . Laparoscopic tubal ligation  01/09/2012    Procedure: LAPAROSCOPIC TUBAL LIGATION;  Surgeon: Kathreen Cosier, MD;  Location: [facility];  Service: Gynecology;  Laterality: Bilateral;    Family History  Problem Relation Age of Onset  . Anesthesia problems Neg Hx   . Hearing loss Neg Hx     History  Substance Use Topics  . Smoking status: Current Every Day Smoker -- 0.25 packs/day    Types: Cigarettes  . Smokeless tobacco: Never Used     Comment: quit with preg  . Alcohol Use: No    Allergies: No Known Allergies  No prescriptions prior to admission    Review of Systems  Constitutional: Positive for chills and malaise/fatigue. Negative for fever.  HENT: Positive for neck pain. Negative for congestion and sore throat.   Respiratory: Positive for cough, sputum production and shortness of breath.   Cardiovascular: Negative for chest pain.  Gastrointestinal: Negative for nausea, vomiting, abdominal pain  and diarrhea.  Genitourinary: Negative for dysuria, urgency, frequency and flank pain.  Neurological: Positive for dizziness, weakness and headaches. Negative for loss of consciousness.   Physical Exam   Blood pressure 112/68, pulse 108, temperature 100 F (37.8 C), temperature source Oral, resp. rate 16, height 5\' 2"  (1.575 m), weight 92 lb 12.8 oz (42.094 kg), last menstrual period 01/03/2013, SpO2 97.00%, not currently breastfeeding.  Physical Exam  Constitutional: She is oriented to person, place, and time. She appears well-developed and well-nourished. No distress.  HENT:  Head: Normocephalic and atraumatic.  Right Ear: Tympanic membrane, external ear and ear canal normal.  Left Ear: Tympanic membrane, external ear and ear canal normal.  Nose: Mucosal edema and rhinorrhea present. No sinus tenderness or nasal deformity. No epistaxis. Right sinus exhibits frontal sinus tenderness. Right sinus exhibits no maxillary sinus tenderness. Left sinus exhibits frontal sinus tenderness. Left sinus exhibits no maxillary sinus tenderness.  Mouth/Throat: Normal dentition. Posterior oropharyngeal erythema present. No oropharyngeal exudate, posterior oropharyngeal edema or tonsillar abscesses.  Cardiovascular: Normal rate, regular rhythm and normal heart sounds.   Respiratory: Effort normal and breath sounds normal. No respiratory distress.  GI: Soft. Bowel sounds are normal. She exhibits no distension and no mass. There is no tenderness. There  is CVA tenderness (right sided). There is no rebound and no guarding.  Neurological: She is alert and oriented to person, place, and time.  Skin: Skin is warm and dry. No erythema.  Psychiatric: She has a normal mood and affect.   Results for orders placed during the hospital encounter of 01/19/13 (from the past 24 hour(s))  URINALYSIS, ROUTINE W REFLEX MICROSCOPIC     Status: Abnormal   Collection Time    01/19/13  8:26 PM      Result Value Range   Color,  Urine YELLOW  YELLOW   APPearance CLEAR  CLEAR   Specific Gravity, Urine >1.030 (*) 1.005 - 1.030   pH 6.0  5.0 - 8.0   Glucose, UA NEGATIVE  NEGATIVE mg/dL   Hgb urine dipstick NEGATIVE  NEGATIVE   Bilirubin Urine NEGATIVE  NEGATIVE   Ketones, ur NEGATIVE  NEGATIVE mg/dL   Protein, ur NEGATIVE  NEGATIVE mg/dL   Urobilinogen, UA 4.0 (*) 0.0 - 1.0 mg/dL   Nitrite POSITIVE (*) NEGATIVE   Leukocytes, UA NEGATIVE  NEGATIVE  URINE MICROSCOPIC-ADD ON     Status: Abnormal   Collection Time    01/19/13  8:26 PM      Result Value Range   Squamous Epithelial / LPF FEW (*) RARE   WBC, UA 0-2  <3 WBC/hpf   RBC / HPF 0-2  <3 RBC/hpf   Bacteria, UA MANY (*) RARE    MAU Course  Procedures None  MDM Discussed with Dr. Shawnie Pons. Rx for Levaquin could help with URI and UTI. Rx Nasacort for congestion. OTC recommendations.  Fioricet for headache - patient reports worsening of headache symptoms and appears very uncomfortable.  1 L NS infusion with 25 mg Benadryl, 10 mg Reglan and 10 mg Decadron for headache - patient states significant improvement in headache. No pain at this time.   Assessment and Plan  A: URI UTI with possible developing pyelonephritis  Headache  P: Discharge home Rx for Levaquin and Nasacort given to patient Patient advised to monitor symptoms and fever Patient advised to increase PO hydration as tolerated Patient may return to MAU as needed, however it has been explained that Urgent Care, [facility] or [facility] may be better equipped to treat further similar issues as they are not GYN in origin  Freddi Starr, PA-C  01/19/2013, 11:15 PM

## 2013-01-20 NOTE — MAU Provider Note (Signed)
Chart reviewed and agree with management and plan.  

## 2013-06-21 ENCOUNTER — Emergency Department (HOSPITAL_COMMUNITY)
Admission: EM | Admit: 2013-06-21 | Discharge: 2013-06-21 | Disposition: A | Discharge status: Home / Self Care | Payer: Medicaid Other | Attending: Emergency Medicine | Admitting: Emergency Medicine

## 2013-06-21 ENCOUNTER — Encounter (HOSPITAL_COMMUNITY): Payer: Self-pay | Admitting: Emergency Medicine

## 2013-06-21 DIAGNOSIS — Z8619 Personal history of other infectious and parasitic diseases: Secondary | ICD-10-CM | POA: Insufficient documentation

## 2013-06-21 DIAGNOSIS — R011 Cardiac murmur, unspecified: Secondary | ICD-10-CM | POA: Insufficient documentation

## 2013-06-21 DIAGNOSIS — R51 Headache: Secondary | ICD-10-CM | POA: Insufficient documentation

## 2013-06-21 DIAGNOSIS — Z3202 Encounter for pregnancy test, result negative: Secondary | ICD-10-CM | POA: Insufficient documentation

## 2013-06-21 DIAGNOSIS — F17200 Nicotine dependence, unspecified, uncomplicated: Secondary | ICD-10-CM | POA: Insufficient documentation

## 2013-06-21 DIAGNOSIS — IMO0001 Reserved for inherently not codable concepts without codable children: Secondary | ICD-10-CM | POA: Insufficient documentation

## 2013-06-21 DIAGNOSIS — J069 Acute upper respiratory infection, unspecified: Secondary | ICD-10-CM | POA: Insufficient documentation

## 2013-06-21 DIAGNOSIS — Z8751 Personal history of pre-term labor: Secondary | ICD-10-CM | POA: Insufficient documentation

## 2013-06-21 DIAGNOSIS — Z87440 Personal history of urinary (tract) infections: Secondary | ICD-10-CM | POA: Insufficient documentation

## 2013-06-21 DIAGNOSIS — M546 Pain in thoracic spine: Secondary | ICD-10-CM | POA: Insufficient documentation

## 2013-06-21 LAB — URINALYSIS, ROUTINE W REFLEX MICROSCOPIC
Glucose, UA: NEGATIVE mg/dL
Specific Gravity, Urine: 1.024 (ref 1.005–1.030)
pH: 7.5 (ref 5.0–8.0)

## 2013-06-21 LAB — URINE MICROSCOPIC-ADD ON

## 2013-06-21 LAB — POCT PREGNANCY, URINE: Preg Test, Ur: NEGATIVE

## 2013-06-21 MED ORDER — IBUPROFEN 800 MG PO TABS: 800.0000 mg | ORAL_TABLET | Freq: Three times a day (TID) | ORAL | Status: DC

## 2013-06-21 MED ORDER — BENZONATATE 100 MG PO CAPS: 100.0000 mg | ORAL_CAPSULE | Freq: Three times a day (TID) | ORAL | Status: DC | PRN

## 2013-06-21 NOTE — ED Notes (Signed)
Pt c/o cough x's 5 days with soreness in left mid back.

## 2013-06-21 NOTE — ED Provider Notes (Signed)
CSN: [id number]     Arrival date & time 06/21/13  1522 History  This chart was scribed for non-physician practitioner, Dierdre Forth, PA-C working with Roney Marion, MD by Greggory Stallion, ED scribe. This patient was seen in room TR08C/TR08C and the patient's care was started at 4:10 PM.   Chief Complaint  Patient presents with  . Cough   The history is provided by the patient. No language interpreter was used.   HPI Comments: Tara [name] is a 27 y.o. female who presents to the Emergency Department complaining of gradual onset productive cough of green sputum with associated mild, generalized headache that started 5 days ago. She states she is starting to have left, mid back pain due to the cough. Pt has taken Tylenol for her headache with some relief. No other OTC therapies have been tried.  Nothing seems to make the cough better or worse.  Denies sick contacts. Denies nasal congestion, ear pain, sore throat, fever, chills, neck pain, neck stiffness.   Past Medical History  Diagnosis Date  . Preterm labor   . Heart murmur     as baby  . Urinary tract infection   . History of gonorrhea   . History of chlamydia   . FAOZHYQM(578.4)    Past Surgical History  Procedure Laterality Date  . Induced abortion    . Dilation and curettage of uterus    . Laparoscopic tubal ligation  01/09/2012    Procedure: LAPAROSCOPIC TUBAL LIGATION;  Surgeon: Kathreen Cosier, MD;  Location: [facility];  Service: Gynecology;  Laterality: Bilateral;   Family History  Problem Relation Age of Onset  . Anesthesia problems Neg Hx   . Hearing loss Neg Hx    History  Substance Use Topics  . Smoking status: Current Every Day Smoker -- 0.25 packs/day    Types: Cigarettes  . Smokeless tobacco: Never Used     Comment: quit with preg  . Alcohol Use: No   OB History   Grav Para Term Preterm Abortions TAB SAB Ect Mult Living   4 2 2  2 2    2      Review of Systems  Constitutional: Negative for fever,  diaphoresis, appetite change, fatigue and unexpected weight change.  HENT: Negative for congestion, ear pain, mouth sores and sore throat.   Eyes: Negative for visual disturbance.  Respiratory: Positive for cough. Negative for chest tightness, shortness of breath and wheezing.   Cardiovascular: Negative for chest pain.  Gastrointestinal: Negative for nausea, vomiting, abdominal pain, diarrhea and constipation.  Endocrine: Negative for polydipsia, polyphagia and polyuria.  Genitourinary: Negative for dysuria, urgency, frequency and hematuria.  Musculoskeletal: Positive for back pain and myalgias. Negative for gait problem, joint swelling and neck stiffness.  Skin: Negative for rash.  Allergic/Immunologic: Negative for immunocompromised state.  Neurological: Positive for headaches. Negative for syncope and light-headedness.  Hematological: Does not bruise/bleed easily.  Psychiatric/Behavioral: Negative for sleep disturbance. The patient is not nervous/anxious.     Allergies  Review of patient's allergies indicates no known allergies.  Home Medications   Current Outpatient Rx  Name  Route  Sig  Dispense  Refill  . benzonatate (TESSALON PERLES) 100 MG capsule   Oral   Take 1 capsule (100 mg total) by mouth 3 (three) times daily as needed for cough (cough).   20 capsule   0   . ibuprofen (ADVIL,MOTRIN) 800 MG tablet   Oral   Take 1 tablet (800 mg total) by mouth 3 (  three) times daily. As needed for chest wall pain   21 tablet   0     BP 102/62  Pulse 83  Temp(Src) 98.3 F (36.8 C) (Oral)  Resp 18  Wt 103 lb (46.72 kg)  SpO2 100%  LMP 06/21/2013  Physical Exam  Constitutional: She is oriented to person, place, and time. She appears well-developed and well-nourished. No distress.  HENT:  Head: Normocephalic and atraumatic.  Right Ear: Tympanic membrane, external ear and ear canal normal.  Left Ear: Tympanic membrane, external ear and ear canal normal.  Nose: Nose normal.  No mucosal edema or rhinorrhea. No epistaxis. Right sinus exhibits no maxillary sinus tenderness and no frontal sinus tenderness. Left sinus exhibits no maxillary sinus tenderness and no frontal sinus tenderness.  Mouth/Throat: Uvula is midline, oropharynx is clear and moist and mucous membranes are normal. Mucous membranes are not pale and not cyanotic. No oropharyngeal exudate, posterior oropharyngeal edema, posterior oropharyngeal erythema or tonsillar abscesses.  Eyes: Conjunctivae are normal. Pupils are equal, round, and reactive to light.  Neck: Normal range of motion and full passive range of motion without pain.  Cardiovascular: Normal rate, regular rhythm and intact distal pulses.   Pulmonary/Chest: Effort normal and breath sounds normal. No stridor. She has no wheezes. She has no rales.  Abdominal: Soft. Bowel sounds are normal. She exhibits no distension. There is no tenderness.  Musculoskeletal: Normal range of motion.  Right paraspinal thoracic tenderness.   Lymphadenopathy:    She has no cervical adenopathy.  Neurological: She is alert and oriented to person, place, and time.  Skin: Skin is dry. No rash noted. She is not diaphoretic.  Psychiatric: She has a normal mood and affect.    ED Course  Procedures (including critical care time)  DIAGNOSTIC STUDIES: Oxygen Saturation is 100% on RA, normal by my interpretation.    COORDINATION OF CARE: 4:12 PM-Discussed treatment plan which includes chest xray with pt at bedside and pt agreed to plan.   Labs Review Labs Reviewed  URINALYSIS, ROUTINE W REFLEX MICROSCOPIC - Abnormal; Notable for the following:    APPearance CLOUDY (*)    Hgb urine dipstick LARGE (*)    Bilirubin Urine SMALL (*)    Protein, ur 30 (*)    Leukocytes, UA SMALL (*)    All other components within normal limits  URINE MICROSCOPIC-ADD ON - Abnormal; Notable for the following:    Squamous Epithelial / LPF FEW (*)    Bacteria, UA FEW (*)    All other  components within normal limits  POCT PREGNANCY, URINE   Imaging Review No results found.  EKG Interpretation   None       MDM   1. Viral URI with cough      Aissatou Dust sense with cough and chest congestion for several days.  Patient with clinical breath sounds and no hypoxia. She reports after several days of coughing she began to have left-sided midthoracic back pain. Physical exam this pain is reproducible with palpation. She has a tidal volume no wheezes or rhonchi. Doubt pneumonia.  Patient reports that she will be unable to stay for chest x-ray as she must attend work. I believe that she has a low likelihood of bacterial chest infection. I have discussed the benefits and risks of chest x-ray and her choice to decline this evaluation. Patient retained the capacity to make this decision and is competent to do so. Have given strict return precautions for possible pneumonia.  Patient given  symptomatic treatment and discuss conservative therapies.  It has been determined that no acute conditions requiring further emergency intervention are present at this time. The patient/guardian have been advised of the diagnosis and plan. We have discussed signs and symptoms that warrant return to the ED, such as changes or worsening in symptoms.   Vital signs are stable at discharge.   BP 102/62  Pulse 83  Temp(Src) 98.3 F (36.8 C) (Oral)  Resp 18  Wt 103 lb (46.72 kg)  SpO2 100%  LMP 06/21/2013  Patient/guardian has voiced understanding and agreed to follow-up with the PCP or specialist.    I personally performed the services described in this documentation, which was scribed in my presence. The recorded information has been reviewed and is accurate.   Dahlia Client Shiah Berhow, PA-C 06/21/13 2116  Dierdre Forth, PA-C 06/21/13 2131

## 2013-06-21 NOTE — ED Notes (Signed)
Family brought back to pt

## 2013-06-21 NOTE — ED Notes (Signed)
Xray contacted regarding delay, pt and provider updated

## 2013-06-21 NOTE — ED Notes (Signed)
Pt states needs to go to work and cannot stay for CXR. EDPA made aware

## 2013-07-01 NOTE — ED Provider Notes (Signed)
Medical screening examination/treatment/procedure(s) were performed by non-physician practitioner and as supervising physician I was immediately available for consultation/collaboration.  EKG Interpretation   None         Roney Marion, MD 07/01/13 [phone]

## 2013-07-10 ENCOUNTER — Emergency Department (HOSPITAL_COMMUNITY): Payer: Medicaid Other

## 2013-07-10 ENCOUNTER — Encounter (HOSPITAL_COMMUNITY): Payer: Self-pay | Admitting: Emergency Medicine

## 2013-07-10 ENCOUNTER — Emergency Department (HOSPITAL_COMMUNITY)
Admission: EM | Admit: 2013-07-10 | Discharge: 2013-07-10 | Disposition: A | Discharge status: Home / Self Care | Payer: Medicaid Other | Attending: Emergency Medicine | Admitting: Emergency Medicine

## 2013-07-10 DIAGNOSIS — S93409A Sprain of unspecified ligament of unspecified ankle, initial encounter: Secondary | ICD-10-CM | POA: Insufficient documentation

## 2013-07-10 DIAGNOSIS — Y9289 Other specified places as the place of occurrence of the external cause: Secondary | ICD-10-CM | POA: Insufficient documentation

## 2013-07-10 DIAGNOSIS — X500XXA Overexertion from strenuous movement or load, initial encounter: Secondary | ICD-10-CM | POA: Insufficient documentation

## 2013-07-10 DIAGNOSIS — R011 Cardiac murmur, unspecified: Secondary | ICD-10-CM | POA: Insufficient documentation

## 2013-07-10 DIAGNOSIS — Z87440 Personal history of urinary (tract) infections: Secondary | ICD-10-CM | POA: Insufficient documentation

## 2013-07-10 DIAGNOSIS — Y9302 Activity, running: Secondary | ICD-10-CM | POA: Insufficient documentation

## 2013-07-10 DIAGNOSIS — Z8619 Personal history of other infectious and parasitic diseases: Secondary | ICD-10-CM | POA: Insufficient documentation

## 2013-07-10 DIAGNOSIS — Z8751 Personal history of pre-term labor: Secondary | ICD-10-CM | POA: Insufficient documentation

## 2013-07-10 DIAGNOSIS — F17200 Nicotine dependence, unspecified, uncomplicated: Secondary | ICD-10-CM | POA: Insufficient documentation

## 2013-07-10 NOTE — ED Notes (Signed)
Returned from Radiology.

## 2013-07-10 NOTE — ED Notes (Signed)
Ortho called for ASO of ankle

## 2013-07-10 NOTE — ED Notes (Signed)
Pt demonstated correct use of splint and crutches

## 2013-07-10 NOTE — Progress Notes (Signed)
Orthopedic Tech Progress Note Patient Details:  Tara [name] [birth year] [id number]  Ortho Devices Type of Ortho Device: ASO;Crutches Ortho Device/Splint Location: rle Ortho Device/Splint Interventions: Application   Nikki Dom 07/10/2013, 8:32 PM

## 2013-07-10 NOTE — ED Provider Notes (Signed)
CSN: [id number]     Arrival date & time 07/10/13  1818 History  This chart was scribed for non-physician practitioner, Kerrie Buffalo, FNP,working with No att. providers found, by Karle Plumber, ED Scribe.  This patient was seen in room TR05C/TR05C and the patient's care was started at 6:32 PM.  Chief Complaint  Patient presents with  . Ankle Injury   Patient is a 27 y.o. female presenting with ankle pain. The history is provided by the patient. No language interpreter was used.  Ankle Pain Location:  Ankle Injury: yes   Ankle location:  R ankle Pain details:    Quality:  Aching and shooting   Radiates to:  Does not radiate   Severity:  Moderate   Onset quality:  Sudden   Timing:  Constant   Progression:  Unchanged Chronicity:  New Dislocation: no   Foreign body present:  No foreign bodies Prior injury to area:  No Relieved by:  None tried Worsened by:  Bearing weight Ineffective treatments:  None tried Associated symptoms: swelling   Associated symptoms: no fever    HPI Comments:  Tara [name] is a 27 y.o. female who presents to the Emergency Department complaining of a right ankle injury that occurred PTA. Pt states she was running upstairs and stepped on her right foot with her left foot causing her to roll her right ankle. Pt denies any other injury. She denies taking anything for pain PTA. She denies nausea, vomiting, fever, or chills.  Past Medical History  Diagnosis Date  . Preterm labor   . Heart murmur     as baby  . Urinary tract infection   . History of gonorrhea   . History of chlamydia   . ONGEXBMW(413.2)    Past Surgical History  Procedure Laterality Date  . Induced abortion    . Dilation and curettage of uterus    . Laparoscopic tubal ligation  01/09/2012    Procedure: LAPAROSCOPIC TUBAL LIGATION;  Surgeon: Kathreen Cosier, MD;  Location: [facility];  Service: Gynecology;  Laterality: Bilateral;   Family History  Problem Relation Age of Onset  .  Anesthesia problems Neg Hx   . Hearing loss Neg Hx    History  Substance Use Topics  . Smoking status: Current Every Day Smoker -- 0.25 packs/day    Types: Cigarettes  . Smokeless tobacco: Never Used     Comment: quit with preg  . Alcohol Use: No   OB History   Grav Para Term Preterm Abortions TAB SAB Ect Mult Living   4 2 2  2 2    2      Review of Systems  Constitutional: Negative for fever and chills.  HENT: Negative.   Gastrointestinal: Negative for nausea and vomiting.  Musculoskeletal: Positive for arthralgias (right ankle pain).  Skin: Negative for wound.  Psychiatric/Behavioral: The patient is not nervous/anxious.     Allergies  Review of patient's allergies indicates no known allergies.  Home Medications   Current Outpatient Rx  Name  Route  Sig  Dispense  Refill  . benzonatate (TESSALON PERLES) 100 MG capsule   Oral   Take 1 capsule (100 mg total) by mouth 3 (three) times daily as needed for cough (cough).   20 capsule   0   . ibuprofen (ADVIL,MOTRIN) 800 MG tablet   Oral   Take 1 tablet (800 mg total) by mouth 3 (three) times daily. As needed for chest wall pain   21 tablet   0  Triage Vitals: BP 101/60  Pulse 89  Temp(Src) 98 F (36.7 C) (Oral)  Resp 18  SpO2 100%  LMP 06/21/2013 Physical Exam  Nursing note and vitals reviewed. Constitutional: She is oriented to person, place, and time. She appears well-developed and well-nourished.  HENT:  Head: Normocephalic and atraumatic.  Eyes: Conjunctivae and EOM are normal.  Neck: Normal range of motion. Neck supple.  Cardiovascular: Normal rate.   Good dorsal pedis pulses.  Pulmonary/Chest: Effort normal.  Musculoskeletal: Normal range of motion. She exhibits tenderness. She exhibits no edema.       Right ankle: She exhibits swelling. She exhibits normal range of motion, no deformity, no laceration and normal pulse. Tenderness. Lateral malleolus tenderness found. Achilles tendon normal.  Pain on  the lateral aspect of the right malleolus. No defect of achilles tendon. Minimal tenderness. No tenderness of medial aspect. No plantar tenderness.   Neurological: She is alert and oriented to person, place, and time.  Skin: Skin is warm and dry.  Minimal bruising of lateral aspect of right ankle. Skin intact.   Psychiatric: She has a normal mood and affect. Her behavior is normal.    ED Course  Procedures (including critical care time) DIAGNOSTIC STUDIES: Oxygen Saturation is 100% on RA, normal by my interpretation.   COORDINATION OF CARE: 6:36 PM- Will X-Ray right ankle. Will give pt something for pain. Pt verbalizes understanding and agrees to plan.  Medications - No data to display  Labs Review Labs Reviewed - No data to display Imaging Review Dg Ankle Complete Right  07/10/2013   CLINICAL DATA:  Trauma/fall, lateral ankle pain  EXAM: RIGHT ANKLE - COMPLETE 3+ VIEW  COMPARISON:  None.  FINDINGS: No fracture or dislocation is seen.  The ankle mortise is intact.  The base of the fifth metatarsal is unremarkable.  The visualized soft tissues are unremarkable.  IMPRESSION: No fracture or dislocation is seen.   Electronically Signed   By: Charline Bills M.D.   On: 07/10/2013 20:07    EKG Interpretation   None      27 y.o. female with pain in the right ankle s/p injury. Placed in ASO and given crutches. Will treat for pain and inflammation. She will apply ice and elevate the area. Stable for discharge without any immediate complications. She remains neurovascularly intact. She will take ibuprofen as needed for discomfort.  I have reviewed this patient's vital signs, nurses notes, appropriate labs and imaging.  I have discussed findings and plan of care with the patient. She voices understanding.    MDM  I personally performed the services described in this documentation, which was scribed in my presence. The recorded information has been reviewed and is accurate.     [address] [place], [place] 07/11/13 [phone]

## 2013-07-10 NOTE — ED Notes (Signed)
The pt is c/o rt ankle pain since she fell earlier today  Outside while running.  Sl swelling to the rt abkle lmp nov 11th

## 2013-07-10 NOTE — ED Notes (Signed)
Patient transported to X-ray 

## 2013-07-14 NOTE — ED Provider Notes (Signed)
Medical screening examination/treatment/procedure(s) were performed by non-physician practitioner and as supervising physician I was immediately available for consultation/collaboration.  EKG Interpretation   None         Dajha Urquilla W. Mekiah Cambridge, MD 07/14/13 1420 

## 2013-08-10 ENCOUNTER — Inpatient Hospital Stay (HOSPITAL_COMMUNITY): Payer: Medicaid Other

## 2013-08-10 ENCOUNTER — Encounter (HOSPITAL_COMMUNITY): Payer: Self-pay | Admitting: *Deleted

## 2013-08-10 ENCOUNTER — Inpatient Hospital Stay (HOSPITAL_COMMUNITY)
Admission: AD | Admit: 2013-08-10 | Discharge: 2013-08-10 | Disposition: A | Discharge status: Home / Self Care | Payer: Medicaid Other | Source: Ambulatory Visit | Attending: Obstetrics | Admitting: Obstetrics

## 2013-08-10 DIAGNOSIS — A499 Bacterial infection, unspecified: Secondary | ICD-10-CM | POA: Insufficient documentation

## 2013-08-10 DIAGNOSIS — R109 Unspecified abdominal pain: Secondary | ICD-10-CM | POA: Insufficient documentation

## 2013-08-10 DIAGNOSIS — B9689 Other specified bacterial agents as the cause of diseases classified elsewhere: Secondary | ICD-10-CM | POA: Insufficient documentation

## 2013-08-10 DIAGNOSIS — N760 Acute vaginitis: Secondary | ICD-10-CM | POA: Insufficient documentation

## 2013-08-10 DIAGNOSIS — Z3202 Encounter for pregnancy test, result negative: Secondary | ICD-10-CM | POA: Insufficient documentation

## 2013-08-10 DIAGNOSIS — N912 Amenorrhea, unspecified: Secondary | ICD-10-CM | POA: Insufficient documentation

## 2013-08-10 LAB — URINALYSIS, ROUTINE W REFLEX MICROSCOPIC
Bilirubin Urine: NEGATIVE
Glucose, UA: NEGATIVE mg/dL
Hgb urine dipstick: NEGATIVE
Ketones, ur: 15 mg/dL — AB
LEUKOCYTES UA: NEGATIVE
NITRITE: NEGATIVE
PH: 7 (ref 5.0–8.0)
Protein, ur: NEGATIVE mg/dL
SPECIFIC GRAVITY, URINE: 1.02 (ref 1.005–1.030)
UROBILINOGEN UA: 1 mg/dL (ref 0.0–1.0)

## 2013-08-10 LAB — CBC WITH DIFFERENTIAL/PLATELET
BASOS ABS: 0 10*3/uL (ref 0.0–0.1)
Basophils Relative: 0 % (ref 0–1)
EOS ABS: 0.1 10*3/uL (ref 0.0–0.7)
EOS PCT: 2 % (ref 0–5)
HCT: 31 % — ABNORMAL LOW (ref 36.0–46.0)
Hemoglobin: 10.1 g/dL — ABNORMAL LOW (ref 12.0–15.0)
LYMPHS ABS: 1.9 10*3/uL (ref 0.7–4.0)
Lymphocytes Relative: 38 % (ref 12–46)
MCH: 29.1 pg (ref 26.0–34.0)
MCHC: 32.6 g/dL (ref 30.0–36.0)
MCV: 89.3 fL (ref 78.0–100.0)
Monocytes Absolute: 0.3 10*3/uL (ref 0.1–1.0)
Monocytes Relative: 6 % (ref 3–12)
NEUTROS PCT: 54 % (ref 43–77)
Neutro Abs: 2.7 10*3/uL (ref 1.7–7.7)
PLATELETS: 231 10*3/uL (ref 150–400)
RBC: 3.47 MIL/uL — AB (ref 3.87–5.11)
RDW: 13.8 % (ref 11.5–15.5)
WBC: 5.1 10*3/uL (ref 4.0–10.5)

## 2013-08-10 LAB — WET PREP, GENITAL
Trich, Wet Prep: NONE SEEN
Yeast Wet Prep HPF POC: NONE SEEN

## 2013-08-10 LAB — POCT PREGNANCY, URINE: Preg Test, Ur: NEGATIVE

## 2013-08-10 MED ORDER — METRONIDAZOLE 500 MG PO TABS: 500.0000 mg | ORAL_TABLET | Freq: Two times a day (BID) | ORAL | Status: DC

## 2013-08-10 MED ORDER — KETOROLAC TROMETHAMINE 30 MG/ML IJ SOLN
30.0000 mg | Freq: Once | INTRAMUSCULAR | Status: AC
  Administered 2013-08-10: 30 mg via INTRAVENOUS
  Filled 2013-08-10: qty 1

## 2013-08-10 NOTE — Discharge Instructions (Signed)
Your ultrasound today is normal. Your blood work is normal and your pregnancy test is negative. Follow up with the health department for routine pap smears. Return here as needed for problems.  Abdominal Pain, Women Abdominal (stomach, pelvic, or belly) pain can be caused by many things. It is important to tell your doctor:  The location of the pain.  Does it come and go or is it present all the time?  Are there things that start the pain (eating certain foods, exercise)?  Are there other symptoms associated with the pain (fever, nausea, vomiting, diarrhea)? All of this is helpful to know when trying to find the cause of the pain. CAUSES   Stomach: virus or bacteria infection, or ulcer.  Intestine: appendicitis (inflamed appendix), regional ileitis (Crohn's disease), ulcerative colitis (inflamed colon), irritable bowel syndrome, diverticulitis (inflamed diverticulum of the colon), or cancer of the stomach or intestine.  Gallbladder disease or stones in the gallbladder.  Kidney disease, kidney stones, or infection.  Pancreas infection or cancer.  Fibromyalgia (pain disorder).  Diseases of the female organs:  Uterus: fibroid (non-cancerous) tumors or infection.  Fallopian tubes: infection or tubal pregnancy.  Ovary: cysts or tumors.  Pelvic adhesions (scar tissue).  Endometriosis (uterus lining tissue growing in the pelvis and on the pelvic organs).  Pelvic congestion syndrome (female organs filling up with blood just before the menstrual period).  Pain with the menstrual period.  Pain with ovulation (producing an egg).  Pain with an IUD (intrauterine device, birth control) in the uterus.  Cancer of the female organs.  Functional pain (pain not caused by a disease, may improve without treatment).  Psychological pain.  Depression. DIAGNOSIS  Your doctor will decide the seriousness of your pain by doing an examination.  Blood tests.  X-rays.  Ultrasound.  CT  scan (computed tomography, special type of X-ray).  MRI (magnetic resonance imaging).  Cultures, for infection.  Barium enema (dye inserted in the large intestine, to better view it with X-rays).  Colonoscopy (looking in intestine with a lighted tube).  Laparoscopy (minor surgery, looking in abdomen with a lighted tube).  Major abdominal exploratory surgery (looking in abdomen with a large incision). TREATMENT  The treatment will depend on the cause of the pain.   Many cases can be observed and treated at home.  Over-the-counter medicines recommended by your caregiver.  Prescription medicine.  Antibiotics, for infection.  Birth control pills, for painful periods or for ovulation pain.  Hormone treatment, for endometriosis.  Nerve blocking injections.  Physical therapy.  Antidepressants.  Counseling with a psychologist or psychiatrist.  Minor or major surgery. HOME CARE INSTRUCTIONS   Do not take laxatives, unless directed by your caregiver.  Take over-the-counter pain medicine only if ordered by your caregiver. Do not take aspirin because it can cause an upset stomach or bleeding.  Try a clear liquid diet (broth or water) as ordered by your caregiver. Slowly move to a bland diet, as tolerated, if the pain is related to the stomach or intestine.  Have a thermometer and take your temperature several times a day, and record it.  Bed rest and sleep, if it helps the pain.  Avoid sexual intercourse, if it causes pain.  Avoid stressful situations.  Keep your follow-up appointments and tests, as your caregiver orders.  If the pain does not go away with medicine or surgery, you may try:  Acupuncture.  Relaxation exercises (yoga, meditation).  Group therapy.  Counseling. SEEK MEDICAL CARE IF:   You  notice certain foods cause stomach pain.  Your home care treatment is not helping your pain.  You need stronger pain medicine.  You want your IUD  removed.  You feel faint or lightheaded.  You develop nausea and vomiting.  You develop a rash.  You are having side effects or an allergy to your medicine. SEEK IMMEDIATE MEDICAL CARE IF:   Your pain does not go away or gets worse.  You have a fever.  Your pain is felt only in portions of the abdomen. The right side could possibly be appendicitis. The left lower portion of the abdomen could be colitis or diverticulitis.  You are passing blood in your stools (bright red or black tarry stools, with or without vomiting).  You have blood in your urine.  You develop chills, with or without a fever.  You pass out. MAKE SURE YOU:   Understand these instructions.  Will watch your condition.  Will get help right away if you are not doing well or get worse. Document Released: 05/05/2007 Document Revised: 09/30/2011 Document Reviewed: 05/25/2009 [facility] Patient Information 2014 [place], [place].  Bacterial Vaginosis Bacterial vaginosis is an infection of the vagina. It happens when too many of certain germs (bacteria) grow in the vagina. HOME CARE  Take your medicine as told by your doctor.  Finish your medicine even if you start to feel better.  Do not have sex until you finish your medicine and are better.  Tell your sex partner that you have an infection. They should see their doctor for treatment.  Practice safe sex. Use condoms. Have only one sex partner. GET HELP IF:  You are not getting better after 3 days of treatment.  You have more grey fluid (discharge) coming from your vagina than before.  You have more pain than before.  You have a fever. MAKE SURE YOU:   Understand these instructions.  Will watch your condition.  Will get help right away if you are not doing well or get worse. Document Released: 04/16/2008 Document Revised: 04/28/2013 Document Reviewed: 02/17/2013 [facility] Patient Information 2014 [place], [place].

## 2013-08-10 NOTE — MAU Note (Signed)
C/o achy stomach pain since yesterday;

## 2013-08-10 NOTE — MAU Provider Note (Signed)
CSN: [id number]     Arrival date & time 08/10/13  0941 History   None    Chief Complaint  Patient presents with  . Abdominal Pain   (Consider location/radiation/quality/duration/timing/severity/associated sxs/prior Treatment) Patient is a 28 y.o. female presenting with abdominal pain. The history is provided by the patient.  Abdominal Pain The primary symptoms of the illness include abdominal pain. The primary symptoms of the illness do not include fever, shortness of breath, nausea, vomiting, diarrhea, dysuria, vaginal discharge or vaginal bleeding. The current episode started yesterday. The onset of the illness was gradual. The problem has not changed since onset. The illness is associated with awakening from sleep and recent sexual activity. Symptoms associated with the illness do not include chills, constipation, urgency, frequency or back pain. Significant associated medical issues do not include PUD, GERD, diabetes, sickle cell disease, gallstones, liver disease, substance abuse, diverticulitis, HIV or cardiac disease.   Tara [name] is a 28 y.o. female who presents to the ED with low abdominal pain that started yesterday. She describes the pain as constant and goes from dull with occasional sharp pain. She rates the pain as a 6/10 at its worst and 2/10 currently. No constipation or diarrhea. LMP 07/05/2014 and was normal. No birth control.  Past Medical History  Diagnosis Date  . Preterm labor   . Heart murmur     as baby  . Urinary tract infection   . History of gonorrhea   . History of chlamydia   . NWGNFAOZ(308.6Headache(784.0)    Past Surgical History  Procedure Laterality Date  . Induced abortion    . Dilation and curettage of uterus    . Laparoscopic tubal ligation  01/09/2012    Procedure: LAPAROSCOPIC TUBAL LIGATION;  Surgeon: Kathreen CosierBernard A Marshall, MD;  Location: [facility];  Service: Gynecology;  Laterality: Bilateral;   Family History  Problem Relation Age of Onset  . Anesthesia  problems Neg Hx   . Hearing loss Neg Hx    History  Substance Use Topics  . Smoking status: Current Every Day Smoker -- 0.25 packs/day    Types: Cigarettes  . Smokeless tobacco: Never Used     Comment: quit with preg  . Alcohol Use: No   OB History   Grav Para Term Preterm Abortions TAB SAB Ect Mult Living   4 2 2  2 2    2      Review of Systems  Constitutional: Negative for fever and chills.  HENT: Negative.   Respiratory: Negative for cough and shortness of breath.   Gastrointestinal: Positive for abdominal pain. Negative for nausea, vomiting, diarrhea and constipation.  Genitourinary: Negative for dysuria, urgency, frequency, vaginal bleeding, vaginal discharge, difficulty urinating and pelvic pain.  Musculoskeletal: Negative for back pain and myalgias.  Skin: Negative for rash.  Psychiatric/Behavioral: Negative for confusion. The patient is not nervous/anxious.     Allergies  Review of patient's allergies indicates no known allergies.  Home Medications  No current outpatient prescriptions on file. BP 98/61  Pulse 85  Temp(Src) 98.4 F (36.9 C) (Oral)  Resp 16  Ht 5\' 3"  (1.6 m)  Wt 107 lb 2 oz (48.592 kg)  BMI 18.98 kg/m2  LMP 07/05/2013 Physical Exam  Nursing note and vitals reviewed. Constitutional: She is oriented to person, place, and time. She appears well-developed and well-nourished.  HENT:  Head: Normocephalic and atraumatic.  Eyes: EOM are normal.  Neck: Neck supple.  Cardiovascular: Normal rate, regular rhythm and normal heart sounds.   Pulmonary/Chest:  Effort normal and breath sounds normal.  Abdominal: Soft. Bowel sounds are normal. There is tenderness.  Musculoskeletal: Normal range of motion.  Neurological: She is alert and oriented to person, place, and time. No cranial nerve deficit.  Skin: Skin is warm and dry.  Psychiatric: She has a normal mood and affect. Her behavior is normal.   Results for orders placed during the hospital encounter of  08/10/13 (from the past 24 hour(s))  URINALYSIS, ROUTINE W REFLEX MICROSCOPIC     Status: Abnormal   Collection Time    08/10/13  9:50 AM      Result Value Range   Color, Urine YELLOW  YELLOW   APPearance CLEAR  CLEAR   Specific Gravity, Urine 1.020  1.005 - 1.030   pH 7.0  5.0 - 8.0   Glucose, UA NEGATIVE  NEGATIVE mg/dL   Hgb urine dipstick NEGATIVE  NEGATIVE   Bilirubin Urine NEGATIVE  NEGATIVE   Ketones, ur 15 (*) NEGATIVE mg/dL   Protein, ur NEGATIVE  NEGATIVE mg/dL   Urobilinogen, UA 1.0  0.0 - 1.0 mg/dL   Nitrite NEGATIVE  NEGATIVE   Leukocytes, UA NEGATIVE  NEGATIVE  POCT PREGNANCY, URINE     Status: None   Collection Time    08/10/13  9:53 AM      Result Value Range   Preg Test, Ur NEGATIVE  NEGATIVE  CBC WITH DIFFERENTIAL     Status: Abnormal   Collection Time    08/10/13 10:49 AM      Result Value Range   WBC 5.1  4.0 - 10.5 K/uL   RBC 3.47 (*) 3.87 - 5.11 MIL/uL   Hemoglobin 10.1 (*) 12.0 - 15.0 g/dL   HCT 16.1 (*) 09.6 - 04.5 %   MCV 89.3  78.0 - 100.0 fL   MCH 29.1  26.0 - 34.0 pg   MCHC 32.6  30.0 - 36.0 g/dL   RDW 40.9  81.1 - 91.4 %   Platelets 231  150 - 400 K/uL   Neutrophils Relative % 54  43 - 77 %   Neutro Abs 2.7  1.7 - 7.7 K/uL   Lymphocytes Relative 38  12 - 46 %   Lymphs Abs 1.9  0.7 - 4.0 K/uL   Monocytes Relative 6  3 - 12 %   Monocytes Absolute 0.3  0.1 - 1.0 K/uL   Eosinophils Relative 2  0 - 5 %   Eosinophils Absolute 0.1  0.0 - 0.7 K/uL   Basophils Relative 0  0 - 1 %   Basophils Absolute 0.0  0.0 - 0.1 K/uL  WET PREP, GENITAL     Status: Abnormal   Collection Time    08/10/13 10:55 AM      Result Value Range   Yeast Wet Prep HPF POC NONE SEEN  NONE SEEN   Trich, Wet Prep NONE SEEN  NONE SEEN   Clue Cells Wet Prep HPF POC MODERATE (*) NONE SEEN   WBC, Wet Prep HPF POC MANY (*) NONE SEEN     ED Course  Procedures  US Transvaginal Non-ob  08/10/2013   CLINICAL DATA:  Pelvic pain.  LMP 07/05/2013.  EXAM: TRANSABDOMINAL AND  TRANSVAGINAL ULTRASOUND OF PELVIS  TECHNIQUE: Both transabdominal and transvaginal ultrasound examinations of the pelvis were performed. Transabdominal technique was performed for global imaging of the pelvis including uterus, ovaries, adnexal regions, and pelvic cul-de-sac. It was necessary to proceed with endovaginal exam following the transabdominal exam to visualize the uterus  and ovaries.  COMPARISON:  Obstetric ultrasound 03/18/2011  FINDINGS: Uterus  Measurements: 7.2 x 3.8 x 4.5 cm. Anteverted and normal in echotexture. No focal uterine mass identified.  Endometrium  Thickness: 9 mm in thickness.  No focal abnormality visualized.  Right ovary  Measurements: 3.1 x 2.8 x 2.3 cm. Normal appearance/no adnexal mass.  Left ovary  Measurements: 3.1 x 1.4 x 1.6 cm. Normal appearance/no adnexal mass.  Other findings  No free fluid.  IMPRESSION: Pelvic ultrasound within normal limits.   Electronically Signed   By: Britta Mccreedy M.D.   On: 08/10/2013 13:01   US Pelvis Complete  08/10/2013   CLINICAL DATA:  Pelvic pain.  LMP 07/05/2013.  EXAM: TRANSABDOMINAL AND TRANSVAGINAL ULTRASOUND OF PELVIS  TECHNIQUE: Both transabdominal and transvaginal ultrasound examinations of the pelvis were performed. Transabdominal technique was performed for global imaging of the pelvis including uterus, ovaries, adnexal regions, and pelvic cul-de-sac. It was necessary to proceed with endovaginal exam following the transabdominal exam to visualize the uterus and ovaries.  COMPARISON:  Obstetric ultrasound 03/18/2011  FINDINGS: Uterus  Measurements: 7.2 x 3.8 x 4.5 cm. Anteverted and normal in echotexture. No focal uterine mass identified.  Endometrium  Thickness: 9 mm in thickness.  No focal abnormality visualized.  Right ovary  Measurements: 3.1 x 2.8 x 2.3 cm. Normal appearance/no adnexal mass.  Left ovary  Measurements: 3.1 x 1.4 x 1.6 cm. Normal appearance/no adnexal mass.  Other findings  No free fluid.  IMPRESSION: Pelvic  ultrasound within normal limits.   Electronically Signed   By: Britta Mccreedy M.D.   On: 08/10/2013 13:01    MDM  28 y.o. female with abdominal pain x 24 hours and amenorrhea. Negative pregnancy test. Wet prep positive for bacterial vaginosis. Will treat with antibiotics. I have reviewed this patient's vital signs, nurses notes, appropriate labs and imaging.  I have discussed findings and plan of care with the patient. She voices understanding. She is stable for discharge without any pain at this time. She will return as needed for any problems.

## 2013-08-11 LAB — GC/CHLAMYDIA PROBE AMP
CT PROBE, AMP APTIMA: NEGATIVE
GC PROBE AMP APTIMA: NEGATIVE

## 2014-01-14 ENCOUNTER — Encounter (HOSPITAL_COMMUNITY): Payer: Self-pay | Admitting: *Deleted

## 2014-01-14 ENCOUNTER — Inpatient Hospital Stay (HOSPITAL_COMMUNITY)
Admission: EM | Admit: 2014-01-14 | Discharge: 2014-01-16 | DRG: 502 | Disposition: A | Discharge status: Home / Self Care | Payer: Medicaid Other | Attending: Orthopedic Surgery | Admitting: Orthopedic Surgery

## 2014-01-14 ENCOUNTER — Emergency Department (HOSPITAL_COMMUNITY): Payer: Medicaid Other

## 2014-01-14 DIAGNOSIS — F17200 Nicotine dependence, unspecified, uncomplicated: Secondary | ICD-10-CM | POA: Diagnosis present

## 2014-01-14 DIAGNOSIS — W108XXA Fall (on) (from) other stairs and steps, initial encounter: Secondary | ICD-10-CM | POA: Diagnosis present

## 2014-01-14 DIAGNOSIS — S52599A Other fractures of lower end of unspecified radius, initial encounter for closed fracture: Principal | ICD-10-CM | POA: Diagnosis present

## 2014-01-14 DIAGNOSIS — S52502A Unspecified fracture of the lower end of left radius, initial encounter for closed fracture: Secondary | ICD-10-CM

## 2014-01-14 LAB — BASIC METABOLIC PANEL
BUN: 17 mg/dL (ref 6–23)
CALCIUM: 8.4 mg/dL (ref 8.4–10.5)
CO2: 22 mEq/L (ref 19–32)
Chloride: 107 mEq/L (ref 96–112)
Creatinine, Ser: 0.5 mg/dL (ref 0.50–1.10)
GFR calc Af Amer: >90 mL/min (ref >90)
GFR calc non Af Amer: >90 mL/min (ref >90)
GLUCOSE: 77 mg/dL (ref 70–99)
Potassium: 3.8 mEq/L (ref 3.7–5.3)
SODIUM: 142 meq/L (ref 137–147)

## 2014-01-14 LAB — CBC WITH DIFFERENTIAL/PLATELET
BASOS PCT: 0 % (ref 0–1)
Basophils Absolute: 0 10*3/uL (ref 0.0–0.1)
EOS PCT: 0 % (ref 0–5)
Eosinophils Absolute: 0 10*3/uL (ref 0.0–0.7)
HCT: 32.2 % — ABNORMAL LOW (ref 36.0–46.0)
Hemoglobin: 10.6 g/dL — ABNORMAL LOW (ref 12.0–15.0)
Lymphocytes Relative: 18 % (ref 12–46)
Lymphs Abs: 1.6 10*3/uL (ref 0.7–4.0)
MCH: 30.5 pg (ref 26.0–34.0)
MCHC: 32.9 g/dL (ref 30.0–36.0)
MCV: 92.8 fL (ref 78.0–100.0)
Monocytes Absolute: 0.5 10*3/uL (ref 0.1–1.0)
Monocytes Relative: 6 % (ref 3–12)
Neutro Abs: 6.4 10*3/uL (ref 1.7–7.7)
Neutrophils Relative %: 76 % (ref 43–77)
PLATELETS: 270 10*3/uL (ref 150–400)
RBC: 3.47 MIL/uL — ABNORMAL LOW (ref 3.87–5.11)
RDW: 13.3 % (ref 11.5–15.5)
WBC: 8.5 10*3/uL (ref 4.0–10.5)

## 2014-01-14 LAB — SURGICAL PCR SCREEN
MRSA, PCR: NEGATIVE
Staphylococcus aureus: NEGATIVE

## 2014-01-14 MED ORDER — MORPHINE SULFATE 4 MG/ML IJ SOLN
4.0000 mg | Freq: Once | INTRAMUSCULAR | Status: AC
  Administered 2014-01-14: 4 mg via INTRAVENOUS
  Filled 2014-01-14: qty 1

## 2014-01-14 MED ORDER — METHOCARBAMOL 500 MG PO TABS
500.0000 mg | ORAL_TABLET | Freq: Four times a day (QID) | ORAL | Status: DC | PRN
  Administered 2014-01-14 – 2014-01-15 (×3): 500 mg via ORAL
  Filled 2014-01-14 (×3): qty 1

## 2014-01-14 MED ORDER — CHLORHEXIDINE GLUCONATE 4 % EX LIQD
60.0000 mL | Freq: Once | CUTANEOUS | Status: AC
  Administered 2014-01-14: 4 via TOPICAL
  Filled 2014-01-14: qty 60

## 2014-01-14 MED ORDER — CHLORPROMAZINE HCL 25 MG PO TABS
25.0000 mg | ORAL_TABLET | Freq: Three times a day (TID) | ORAL | Status: DC | PRN
  Filled 2014-01-14: qty 1

## 2014-01-14 MED ORDER — DIPHENHYDRAMINE HCL 25 MG PO CAPS: 25.0000 mg | ORAL_CAPSULE | Freq: Four times a day (QID) | ORAL | Status: DC | PRN

## 2014-01-14 MED ORDER — METHOCARBAMOL 1000 MG/10ML IJ SOLN
500.0000 mg | Freq: Four times a day (QID) | INTRAVENOUS | Status: DC | PRN
  Filled 2014-01-14: qty 5

## 2014-01-14 MED ORDER — DOCUSATE SODIUM 100 MG PO CAPS
100.0000 mg | ORAL_CAPSULE | Freq: Two times a day (BID) | ORAL | Status: DC
  Administered 2014-01-14 – 2014-01-16 (×2): 100 mg via ORAL
  Filled 2014-01-14 (×6): qty 1

## 2014-01-14 MED ORDER — MORPHINE SULFATE 2 MG/ML IJ SOLN
1.0000 mg | INTRAMUSCULAR | Status: DC | PRN
  Administered 2014-01-14 (×2): 1 mg via INTRAVENOUS
  Filled 2014-01-14 (×2): qty 1

## 2014-01-14 MED ORDER — VITAMIN C 500 MG PO TABS
1000.0000 mg | ORAL_TABLET | Freq: Every day | ORAL | Status: DC
  Administered 2014-01-16: 1000 mg via ORAL
  Filled 2014-01-14 (×3): qty 2

## 2014-01-14 MED ORDER — OXYCODONE-ACETAMINOPHEN 5-325 MG PO TABS
1.0000 | ORAL_TABLET | ORAL | Status: DC | PRN
  Administered 2014-01-15: 2 via ORAL
  Filled 2014-01-14: qty 2

## 2014-01-14 MED ORDER — ONDANSETRON HCL 4 MG/2ML IJ SOLN
4.0000 mg | Freq: Once | INTRAMUSCULAR | Status: AC
  Administered 2014-01-14: 4 mg via INTRAVENOUS
  Filled 2014-01-14: qty 2

## 2014-01-14 MED ORDER — HYDROCODONE-ACETAMINOPHEN 5-325 MG PO TABS
1.0000 | ORAL_TABLET | ORAL | Status: DC | PRN
  Administered 2014-01-14 – 2014-01-16 (×6): 2 via ORAL
  Filled 2014-01-14 (×6): qty 2

## 2014-01-14 MED ORDER — ALPRAZOLAM 0.5 MG PO TABS
0.5000 mg | ORAL_TABLET | Freq: Four times a day (QID) | ORAL | Status: DC | PRN
  Administered 2014-01-14 – 2014-01-15 (×2): 0.5 mg via ORAL
  Filled 2014-01-14 (×2): qty 1

## 2014-01-14 MED ORDER — ONDANSETRON HCL 4 MG PO TABS
4.0000 mg | ORAL_TABLET | Freq: Four times a day (QID) | ORAL | Status: DC | PRN
  Administered 2014-01-15: 4 mg via ORAL
  Filled 2014-01-14: qty 1

## 2014-01-14 MED ORDER — CEFAZOLIN SODIUM-DEXTROSE 2-3 GM-% IV SOLR
2.0000 g | INTRAVENOUS | Status: AC
Start: 2014-01-15 — End: 2014-01-15
  Administered 2014-01-15: 2 g via INTRAVENOUS
  Filled 2014-01-14: qty 50

## 2014-01-14 MED ORDER — KCL IN DEXTROSE-NACL 20-5-0.45 MEQ/L-%-% IV SOLN
INTRAVENOUS | Status: DC
Start: 2014-01-14 — End: 2014-01-16
  Administered 2014-01-14: 19:00:00 via INTRAVENOUS
  Filled 2014-01-14 (×4): qty 1000

## 2014-01-14 MED ORDER — ADULT MULTIVITAMIN W/MINERALS CH
1.0000 | ORAL_TABLET | Freq: Every day | ORAL | Status: DC
  Administered 2014-01-16: 1 via ORAL
  Filled 2014-01-14 (×2): qty 1

## 2014-01-14 MED ORDER — ONDANSETRON HCL 4 MG/2ML IJ SOLN: 4.0000 mg | Freq: Four times a day (QID) | INTRAMUSCULAR | Status: DC | PRN

## 2014-01-14 NOTE — ED Notes (Signed)
Pt is completely undressed and placed in gown with hospital non-slip socks on; pt stated she is on her menstrual and was given maternity underwear with pads to use; also pt wanted something to cover her head so a trauma bonnet was placed over her hair; boyfriend handed her clothes of which he placed in a personal belongings bag and placed beside his personal backpack

## 2014-01-14 NOTE — ED Provider Notes (Signed)
Medical screening examination/treatment/procedure(s) were conducted as a shared visit with non-physician practitioner(s) and myself.  I personally evaluated the patient during the encounter.   EKG Interpretation None      Pt c/o left wrist pain s/p mechanical fall. Deformity noted. Skin intact. Radial pulse 2+.  Pt refusing to move left hand/fingers due to pain. Left hand r/m/u nerve fxn sens intact. After much encouragement will wiggle all fingers and will flex/extend and abduct fingers. Xrays. Ortho/hand consulted.   Suzi RootsKevin E Steinl, MD 01/14/14 Paulo [id number]

## 2014-01-14 NOTE — Progress Notes (Signed)
Orthopedic Tech Progress Note Patient Details:  Tara [name] Oct 18, 1985 [id number]  Ortho Devices Type of Ortho Device: Ace wrap;Sugartong splint Ortho Device/Splint Location: lue Ortho Device/Splint Interventions: Application As ordered by Dr. Olevia Bowensrtman  [name], Tara 01/14/2014, 7:17 PM

## 2014-01-14 NOTE — ED Notes (Signed)
Pt has returned from using the bathroom; placed pt back on, monitor, continuous pulse oximetry and blood pressure cuff

## 2014-01-14 NOTE — ED Notes (Signed)
**Note De-Identified  Obfuscation** Explained to pt that she needs to move her fingers frequently from time to time per Dr. Melvyn Novasrtmann (Ortho Surgeon); gave pt a demonstration of how Dr. Melvyn Novasrtmann wants her to move fingers and had her to move her fingers as instructed; Gabe, RN and boyfriend present in room

## 2014-01-14 NOTE — ED Notes (Signed)
Ortho tech will see pt in the ED

## 2014-01-14 NOTE — H&P (Signed)
Tara [name] is an 28 y.o. female.   Chief Complaint: left wrist injury HPI: 28 year old female presents for evaluation of left wrist injury. Patient was helping her husband move furniture when she slipped, fell down several steps. She tries to break her fall with her left arm but land directly on a flexed L wrist. Report acute onset of sharp pain to L wrist that radiates to L elbow. Pain is 10/10, severe and incident happened 40 min ago. Denies hitting head or LOC. No other injury. Pt is RHD. Currently c/o numbness throughout her L forearm/wrist/ hand.    Past Medical History  Diagnosis Date  . Preterm labor   . Heart murmur     as baby  . Urinary tract infection   . History of gonorrhea   . History of chlamydia   . XBJYNWGN(562.1Headache(784.0)     Past Surgical History  Procedure Laterality Date  . Induced abortion    . Dilation and curettage of uterus    . Laparoscopic tubal ligation  01/09/2012    Procedure: LAPAROSCOPIC TUBAL LIGATION;  Surgeon: Kathreen CosierBernard A Marshall, MD;  Location: [facility];  Service: Gynecology;  Laterality: Bilateral;    Family History  Problem Relation Age of Onset  . Anesthesia problems Neg Hx   . Hearing loss Neg Hx    Social History:  reports that she has been smoking Cigarettes.  She has been smoking about 0.25 packs per day. She has never used smokeless tobacco. She reports that she does not drink alcohol or use illicit drugs.  Allergies: No Known Allergies   (Not in a hospital admission)  No results found for this or any previous visit (from the past 48 hour(s)). Dg Forearm Left  01/14/2014   CLINICAL DATA:  Larey SeatFell.  Pain and deformity.  EXAM: LEFT FOREARM - 2 VIEW  COMPARISON:  None.  FINDINGS: There is a transverse fracture of the distal radial metaphysis with ventral angulation. There is dorsal tilt of the distal radial articular surface. Fracture line extends to the articular surface. No ulnar fracture.  IMPRESSION: Transverse fracture of the distal radial  metaphysis with ventral angulation and dorsal tilt of the distal radial articular surface. Fracture line extends to the articular surface.   Electronically Signed   By: Paulina FusiMark  Shogry M.D.   On: 01/14/2014 15:33   Dg Wrist Complete Left  01/14/2014   CLINICAL DATA:  Fall.  EXAM: LEFT WRIST - COMPLETE 3+ VIEW  COMPARISON:  None.  FINDINGS: There is a displaced mildly comminuted fracture of the distal radial metaphyseal region extending to the articular surface with dorsal displacement/ angulation of the predominant distal fragment. Remainder the exam is unremarkable.  IMPRESSION: Displaced comminuted fracture of the distal radius involving the articular surface.   Electronically Signed   By: Elberta Fortisaniel  Boyle M.D.   On: 01/14/2014 15:34    ROS NO RECENT ILLNESSES OR HOSPITALIZATIONS  Blood pressure 102/61, pulse 82, temperature 97.9 F (36.6 C), temperature source Oral, resp. rate 18, last menstrual period 01/14/2014, SpO2 98.00%. Physical Exam  General Appearance:  Alert, cooperative, no distress, appears stated age  Head:  Normocephalic, without obvious abnormality, atraumatic  Eyes:  Pupils equal, conjunctiva/corneas clear,         Throat: Lips, mucosa, and tongue normal; teeth and gums normal  Neck: No visible masses     Lungs:   respirations unlabored  Chest Wall:  No tenderness or deformity  Heart:  Regular rate and rhythm,  Abdomen:   Soft, non-tender,  Extremities: LEFT WRIST: + DEFORMITY TO DORSUM OF WRIST LIMITED WRIST AND DIGITAL MOTION ABLE TO EXTEND THUMB AND WIGGLE FINGERS NO OPEN WOUNDS  Pulses: 2+ and symmetric  Skin: Skin color, texture, turgor normal, no rashes or lesions     Neurologic: Normal     Assessment/Plan COMMINUTED LEFT DISTAL RADIUS FRACTURE, CLOSED DISPLACED  ADMISSION FOR PAIN CONTROL AND ELEVATION PLAN FOR SURGERY IN AM OPEN REDUCTION AND INTERNAL FIXATION  PT VOICED UNDERSTANDING AND REASON FOR ADMISSION AND SURGERY TO RESTORE ALIGNMENT TO  LEFT WRIST .  Sharma CovertORTMANN,FRED W 01/14/2014, 6:30 PM

## 2014-01-14 NOTE — ED Notes (Signed)
repaged Ortho tech; awaiting return call

## 2014-01-14 NOTE — Progress Notes (Signed)
Orthopedic Tech Progress Note Patient Details:  Glorious Golda 1985-08-20 [id number]  Ortho Devices Type of Ortho Device: Ace wrap;Sugartong splint Ortho Device/Splint Location: lue Ortho Device/Splint Interventions: Application   Crawford, Rembert 01/14/2014, 7:15 PM

## 2014-01-14 NOTE — ED Notes (Signed)
Ortho tech has been paged; awaiting return call

## 2014-01-14 NOTE — ED Notes (Addendum)
Patient fell down the stairs this PM.  Patient braced herself with her left arm injuring her wrist.

## 2014-01-14 NOTE — ED Provider Notes (Signed)
CSN: [id number]     Arrival date & time 01/14/14  1355 History   First MD Initiated Contact with Patient 01/14/14 1356     Chief Complaint  Patient presents with  . Joint Swelling     (Consider location/radiation/quality/duration/timing/severity/associated sxs/prior Treatment) HPI  28 year old female presents for evaluation of left wrist injury. Patient was helping her husband move furniture when she slipped, fell down several steps.  She tries to break her fall with her left arm but land directly on a flexed L wrist.  Report acute onset of sharp pain to L wrist that radiates to L elbow.  Pain is 10/10, severe and incident happened 40 min ago.  Denies hitting head or LOC.  No other injury.  Pt is RHD.  Currently c/o numbness throughout her L forearm/wrist/ hand.    Past Medical History  Diagnosis Date  . Preterm labor   . Heart murmur     as baby  . Urinary tract infection   . History of gonorrhea   . History of chlamydia   . WGNFAOZH(086.5Headache(784.0)    Past Surgical History  Procedure Laterality Date  . Induced abortion    . Dilation and curettage of uterus    . Laparoscopic tubal ligation  01/09/2012    Procedure: LAPAROSCOPIC TUBAL LIGATION;  Surgeon: Kathreen CosierBernard A Marshall, MD;  Location: [facility];  Service: Gynecology;  Laterality: Bilateral;   Family History  Problem Relation Age of Onset  . Anesthesia problems Neg Hx   . Hearing loss Neg Hx    History  Substance Use Topics  . Smoking status: Current Every Day Smoker -- 0.25 packs/day    Types: Cigarettes  . Smokeless tobacco: Never Used     Comment: quit with preg  . Alcohol Use: No   OB History   Grav Para Term Preterm Abortions TAB SAB Ect Mult Living   4 2 2  2 2    2      Review of Systems  Constitutional: Negative for fever.  Musculoskeletal: Positive for arthralgias.  Skin: Negative for wound.  Neurological: Positive for numbness.      Allergies  Review of patient's allergies indicates no known  allergies.  Home Medications   Prior to Admission medications   Medication Sig Start Date End Date Taking? Authorizing Provider  metroNIDAZOLE (FLAGYL) 500 MG tablet Take 1 tablet (500 mg total) by mouth 2 (two) times daily. 08/10/13   Hope Orlene OchM Neese, NP   There were no vitals taken for this visit. Physical Exam  Nursing note and vitals reviewed. Constitutional: She appears well-developed and well-nourished. She appears distressed (tearful).  HENT:  Head: Atraumatic.  Eyes: Conjunctivae are normal.  Neck: Neck supple.  Musculoskeletal: She exhibits tenderness (Left wrist with obvious deformity noted, crepitus noted with diffuse tenderness to palpation. No open skin laceration. Radial pulse 2+. Left hand with decreased range of motion secondary to pain, subjective paresthesia throughout all fingers with brisk cap).  L elbow with point tenderness to olecranon but no deformity noted  Decreases flexion/extension 2/2 pain and poor effort.    Neurological: She is alert.  Skin: No rash noted.  Psychiatric: She has a normal mood and affect.    ED Course  Procedures (including critical care time)  2:24 PM Patient injured her left wrist from a fall on an outstretched arm. She has subjective paresthesia to her left hand and an obvious deformity to her left wrist. She does have point tenderness to the olecranon of left elbow.  X-ray ordered. Pain medication given.  3:41 PM X-rays demonstrate a displaced comminuted fracture of the distal radius involving the articular surface. This is a closed injury. Results were discussed with patient. On reexamination, patient has improves sensation to her medial, ulnar and radial distribution when compared to prior. Will consult hand specialist for further management. Radial gutter splint placed.  Care discussed with Dr. Denton LankSteinl.   4:04 PM I have consulted with hand specialist, Dr. Melvyn Novasrtmann, who recommend keeping pt in ER until he can review the xray.    4:57  PM Dr. Melvyn Novasrtmann has reviewed the xray and felt that the wrist fx will need surgery.  He recommend admitting pt to have surgery tomorrow.  Pt made aware of plan and agrees with plan.  Will obtain basic labs and continue with pain management.     Labs Review Labs Reviewed - No data to display  Imaging Review Dg Forearm Left  01/14/2014   CLINICAL DATA:  Larey SeatFell.  Pain and deformity.  EXAM: LEFT FOREARM - 2 VIEW  COMPARISON:  None.  FINDINGS: There is a transverse fracture of the distal radial metaphysis with ventral angulation. There is dorsal tilt of the distal radial articular surface. Fracture line extends to the articular surface. No ulnar fracture.  IMPRESSION: Transverse fracture of the distal radial metaphysis with ventral angulation and dorsal tilt of the distal radial articular surface. Fracture line extends to the articular surface.   Electronically Signed   By: Paulina FusiMark  Shogry M.D.   On: 01/14/2014 15:33   Dg Wrist Complete Left  01/14/2014   CLINICAL DATA:  Fall.  EXAM: LEFT WRIST - COMPLETE 3+ VIEW  COMPARISON:  None.  FINDINGS: There is a displaced mildly comminuted fracture of the distal radial metaphyseal region extending to the articular surface with dorsal displacement/ angulation of the predominant distal fragment. Remainder the exam is unremarkable.  IMPRESSION: Displaced comminuted fracture of the distal radius involving the articular surface.   Electronically Signed   By: Elberta Fortisaniel  Boyle M.D.   On: 01/14/2014 15:34     EKG Interpretation None      MDM   Final diagnoses:  Distal radial fracture, left, closed, initial encounter    BP 102/61  Pulse 82  Temp(Src) 97.9 F (36.6 C) (Oral)  Resp 18  SpO2 98%  LMP 01/14/2014  I have reviewed nursing notes and vital signs. I personally reviewed the imaging tests through PACS system  I reviewed available ER/hospitalization records thought the EMR     Fayrene HelperBowie Tran, [place] JerseyPA-C 01/14/14 1815

## 2014-01-14 NOTE — ED Notes (Signed)
Pt ambulated to the bathroom with her boyfriend who she wants to help her

## 2014-01-14 NOTE — ED Notes (Signed)
Pt given a pillow.  

## 2014-01-14 NOTE — ED Notes (Signed)
Pt placed on continuous pulse oximetry and blood pressure cuff; family at bedside 

## 2014-01-14 NOTE — ED Notes (Signed)
Patient reports that she stumbled and fell down the 6 stairs this afternoon. Patient with a left wrist injury that is currently splinted with a pillow.  RN notes a step off deformity.  RN noted loss of sensationin her fingers

## 2014-01-15 ENCOUNTER — Encounter (HOSPITAL_COMMUNITY): Payer: Medicaid Other | Admitting: Anesthesiology

## 2014-01-15 ENCOUNTER — Inpatient Hospital Stay (HOSPITAL_COMMUNITY): Payer: Medicaid Other | Admitting: Anesthesiology

## 2014-01-15 ENCOUNTER — Encounter (HOSPITAL_COMMUNITY)
Admission: EM | Disposition: A | Discharge status: Home / Self Care | Payer: Self-pay | Source: Home / Self Care | Attending: Orthopedic Surgery

## 2014-01-15 DIAGNOSIS — F17200 Nicotine dependence, unspecified, uncomplicated: Secondary | ICD-10-CM | POA: Diagnosis not present

## 2014-01-15 DIAGNOSIS — S52599A Other fractures of lower end of unspecified radius, initial encounter for closed fracture: Secondary | ICD-10-CM | POA: Diagnosis not present

## 2014-01-15 HISTORY — PX: ORIF WRIST FRACTURE: SHX2133

## 2014-01-15 SURGERY — OPEN REDUCTION INTERNAL FIXATION (ORIF) WRIST FRACTURE
Anesthesia: Choice | Site: Arm Lower | Laterality: Left

## 2014-01-15 MED ORDER — ONDANSETRON HCL 4 MG/2ML IJ SOLN
INTRAMUSCULAR | Status: DC | PRN
  Administered 2014-01-15: 4 mg via INTRAVENOUS

## 2014-01-15 MED ORDER — FENTANYL CITRATE 0.05 MG/ML IJ SOLN
INTRAMUSCULAR | Status: AC
  Filled 2014-01-15: qty 5

## 2014-01-15 MED ORDER — METHOCARBAMOL 500 MG PO TABS: 500.0000 mg | ORAL_TABLET | Freq: Four times a day (QID) | ORAL | Status: DC

## 2014-01-15 MED ORDER — PROPOFOL 10 MG/ML IV BOLUS
INTRAVENOUS | Status: AC
  Filled 2014-01-15: qty 20

## 2014-01-15 MED ORDER — OXYCODONE HCL 5 MG/5ML PO SOLN: 5.0000 mg | Freq: Once | ORAL | Status: DC | PRN

## 2014-01-15 MED ORDER — OXYCODONE HCL 5 MG PO TABS: 5.0000 mg | ORAL_TABLET | Freq: Once | ORAL | Status: DC | PRN

## 2014-01-15 MED ORDER — LIDOCAINE HCL (CARDIAC) 20 MG/ML IV SOLN
INTRAVENOUS | Status: DC | PRN
  Administered 2014-01-15: 20 mg via INTRAVENOUS

## 2014-01-15 MED ORDER — DOCUSATE SODIUM 100 MG PO CAPS: 100.0000 mg | ORAL_CAPSULE | Freq: Two times a day (BID) | ORAL | Status: DC

## 2014-01-15 MED ORDER — HYDROMORPHONE HCL PF 1 MG/ML IJ SOLN: 0.2500 mg | INTRAMUSCULAR | Status: DC | PRN

## 2014-01-15 MED ORDER — LIDOCAINE HCL (CARDIAC) 20 MG/ML IV SOLN
INTRAVENOUS | Status: AC
  Filled 2014-01-15: qty 5

## 2014-01-15 MED ORDER — PROMETHAZINE HCL 25 MG/ML IJ SOLN: 6.2500 mg | INTRAMUSCULAR | Status: DC | PRN

## 2014-01-15 MED ORDER — FENTANYL CITRATE 0.05 MG/ML IJ SOLN
INTRAMUSCULAR | Status: DC | PRN
  Administered 2014-01-15: 100 ug via INTRAVENOUS
  Administered 2014-01-15 (×2): 75 ug via INTRAVENOUS

## 2014-01-15 MED ORDER — LACTATED RINGERS IV SOLN
INTRAVENOUS | Status: DC | PRN
  Administered 2014-01-15 (×2): via INTRAVENOUS

## 2014-01-15 MED ORDER — SODIUM CHLORIDE 0.9 % IR SOLN
Status: DC | PRN
  Administered 2014-01-15: 1000 mL

## 2014-01-15 MED ORDER — MIDAZOLAM HCL 2 MG/2ML IJ SOLN
INTRAMUSCULAR | Status: DC | PRN
  Administered 2014-01-15 (×2): 1 mg via INTRAVENOUS

## 2014-01-15 MED ORDER — VITAMIN C 500 MG PO TABS: 500.0000 mg | ORAL_TABLET | Freq: Every day | ORAL | Status: DC

## 2014-01-15 MED ORDER — PROPOFOL 10 MG/ML IV BOLUS
INTRAVENOUS | Status: DC | PRN
  Administered 2014-01-15: 120 mg via INTRAVENOUS

## 2014-01-15 MED ORDER — MEPERIDINE HCL 25 MG/ML IJ SOLN: 6.2500 mg | INTRAMUSCULAR | Status: DC | PRN

## 2014-01-15 MED ORDER — OXYCODONE-ACETAMINOPHEN 5-325 MG PO TABS: 1.0000 | ORAL_TABLET | ORAL | Status: DC | PRN

## 2014-01-15 MED ORDER — MIDAZOLAM HCL 2 MG/2ML IJ SOLN
INTRAMUSCULAR | Status: AC
  Filled 2014-01-15: qty 2

## 2014-01-15 MED ORDER — MIDAZOLAM HCL 2 MG/2ML IJ SOLN: 0.5000 mg | Freq: Once | INTRAMUSCULAR | Status: DC | PRN

## 2014-01-15 MED ORDER — BUPIVACAINE-EPINEPHRINE (PF) 0.5% -1:200000 IJ SOLN
INTRAMUSCULAR | Status: DC | PRN
  Administered 2014-01-15: 30 mL via PERINEURAL

## 2014-01-15 SURGICAL SUPPLY — 64 items
BANDAGE ELASTIC 3 VELCRO ST LF (GAUZE/BANDAGES/DRESSINGS) ×6 IMPLANT
BANDAGE ELASTIC 4 VELCRO ST LF (GAUZE/BANDAGES/DRESSINGS) ×3 IMPLANT
BANDAGE GAUZE ELAST BULKY 4 IN (GAUZE/BANDAGES/DRESSINGS) ×3 IMPLANT
BIT DRILL 2.2 SS TIBIAL (BIT) ×6 IMPLANT
BLADE SURG ROTATE 9660 (MISCELLANEOUS) IMPLANT
BNDG ESMARK 4X9 LF (GAUZE/BANDAGES/DRESSINGS) ×3 IMPLANT
CORDS BIPOLAR (ELECTRODE) ×3 IMPLANT
COVER SURGICAL LIGHT HANDLE (MISCELLANEOUS) ×3 IMPLANT
CUFF TOURNIQUET SINGLE 18IN (TOURNIQUET CUFF) ×3 IMPLANT
CUFF TOURNIQUET SINGLE 24IN (TOURNIQUET CUFF) IMPLANT
DRAIN TLS ROUND 10FR (DRAIN) IMPLANT
DRAPE OEC MINIVIEW 54X84 (DRAPES) ×3 IMPLANT
DRAPE SURG 17X11 SM STRL (DRAPES) ×3 IMPLANT
DRSG ADAPTIC 3X8 NADH LF (GAUZE/BANDAGES/DRESSINGS) ×3 IMPLANT
ELECT REM PT RETURN 9FT ADLT (ELECTROSURGICAL)
ELECTRODE REM PT RTRN 9FT ADLT (ELECTROSURGICAL) IMPLANT
GAUZE XEROFORM 1X8 LF (GAUZE/BANDAGES/DRESSINGS) ×3 IMPLANT
GLOVE BIOGEL PI IND STRL 8.5 (GLOVE) ×1 IMPLANT
GLOVE BIOGEL PI INDICATOR 8.5 (GLOVE) ×2
GLOVE SURG ORTHO 8.0 STRL STRW (GLOVE) ×3 IMPLANT
GOWN STRL REUS W/ TWL LRG LVL3 (GOWN DISPOSABLE) ×3 IMPLANT
GOWN STRL REUS W/ TWL XL LVL3 (GOWN DISPOSABLE) ×1 IMPLANT
GOWN STRL REUS W/TWL LRG LVL3 (GOWN DISPOSABLE) ×6
GOWN STRL REUS W/TWL XL LVL3 (GOWN DISPOSABLE) ×2
K-WIRE 1.6 (WIRE) ×2
K-WIRE FX5X1.6XNS BN SS (WIRE) ×1
KIT BASIN OR (CUSTOM PROCEDURE TRAY) ×3 IMPLANT
KIT ROOM TURNOVER OR (KITS) ×3 IMPLANT
KWIRE FX5X1.6XNS BN SS (WIRE) ×1 IMPLANT
MANIFOLD NEPTUNE II (INSTRUMENTS) ×3 IMPLANT
NEEDLE HYPO 25X1 1.5 SAFETY (NEEDLE) ×3 IMPLANT
NS IRRIG 1000ML POUR BTL (IV SOLUTION) ×3 IMPLANT
PACK ORTHO EXTREMITY (CUSTOM PROCEDURE TRAY) ×3 IMPLANT
PAD ARMBOARD 7.5X6 YLW CONV (MISCELLANEOUS) ×6 IMPLANT
PAD CAST 4YDX4 CTTN HI CHSV (CAST SUPPLIES) ×1 IMPLANT
PADDING CAST ABS 3INX4YD NS (CAST SUPPLIES) ×4
PADDING CAST ABS 4INX4YD NS (CAST SUPPLIES) ×2
PADDING CAST ABS COTTON 3X4 (CAST SUPPLIES) ×2 IMPLANT
PADDING CAST ABS COTTON 4X4 ST (CAST SUPPLIES) ×1 IMPLANT
PADDING CAST COTTON 4X4 STRL (CAST SUPPLIES) ×2
PEG LOCKING SMOOTH 2.2X16 (Screw) ×12 IMPLANT
PEG LOCKING SMOOTH 2.2X18 (Peg) ×12 IMPLANT
PEG LOCKING SMOOTH 2.2X20 (Screw) ×6 IMPLANT
PLATE NARROW DVR LEFT (Plate) ×3 IMPLANT
SCREW LOCK 12X2.7X 3 LD (Screw) ×1 IMPLANT
SCREW LOCK 14X2.7X 3 LD TPR (Screw) ×4 IMPLANT
SCREW LOCK 18X2.7X 3 LD TPR (Screw) ×1 IMPLANT
SCREW LOCKING 2.7X12MM (Screw) ×2 IMPLANT
SCREW LOCKING 2.7X14 (Screw) ×8 IMPLANT
SCREW LOCKING 2.7X18 (Screw) ×2 IMPLANT
SOAP 2 % CHG 4 OZ (WOUND CARE) ×3 IMPLANT
SPLINT FIBERGLASS 3X35 (CAST SUPPLIES) ×3 IMPLANT
SPONGE GAUZE 4X4 12PLY (GAUZE/BANDAGES/DRESSINGS) ×3 IMPLANT
SPONGE GAUZE 4X4 12PLY STER LF (GAUZE/BANDAGES/DRESSINGS) ×3 IMPLANT
SUT PROLENE 4 0 PS 2 18 (SUTURE) ×3 IMPLANT
SUT VIC AB 2-0 FS1 27 (SUTURE) ×3 IMPLANT
SUT VICRYL 4-0 PS2 18IN ABS (SUTURE) ×3 IMPLANT
SYR CONTROL 10ML LL (SYRINGE) IMPLANT
SYSTEM CHEST DRAIN TLS 7FR (DRAIN) IMPLANT
TOWEL OR 17X24 6PK STRL BLUE (TOWEL DISPOSABLE) ×3 IMPLANT
TOWEL OR 17X26 10 PK STRL BLUE (TOWEL DISPOSABLE) ×3 IMPLANT
TUBE CONNECTING 12'X1/4 (SUCTIONS) ×1
TUBE CONNECTING 12X1/4 (SUCTIONS) ×2 IMPLANT
WATER STERILE IRR 1000ML POUR (IV SOLUTION) ×3 IMPLANT

## 2014-01-15 NOTE — Op Note (Signed)
NAMBarbera [name]:  Tara [name], Tara [name]             ACCOUNT NO.:  [id number]  MEDICAL RECORD NO.:  [id number]  LOCATION:  [facility]                         FACILITY:  [facility]  PHYSICIAN:  Madelynn DoneFred W Ortmann IV, MD  DATE OF BIRTH:  1986/02/14  DATE OF PROCEDURE:  01/15/2014 DATE OF DISCHARGE:                              OPERATIVE REPORT   PREOPERATIVE DIAGNOSIS:  Left wrist intra-articular distal radius fracture 3 or more fragments.  POSTOPERATIVE DIAGNOSIS:  Left wrist intra-articular distal radius fracture 3 or more fragments.  ATTENDING PHYSICIAN:  Sharma CovertFred W. Ortmann IV, MD, who scrubbed and present for the entire procedure.  ASSISTANT SURGEON:  None.  ANESTHESIA:  General via LMA with a supraclavicular block.  SURGICAL PROCEDURE: 1. Open treatment of left wrist intra-articular distal radius fracture     3 or more fragments. 2. Left wrist brachioradialis tendon release tendon lengthening. 3. Radiographs 3-views, left wrist.  SURGICAL IMPLANTS:  Biomet Crosslock narrow plate, with 6 distal locking pegs and 5 screws proximally.  RADIOGRAPHIC INTERPRETATION:  AP and lateral views and oblique views of the wrist interpreted by me do show the volar plate fixation in place. There was good restoration of the radial height, inclination, and volar tilt.  SURGICAL INDICATIONS:  Tara [name] is a right-hand-dominant female who sustained a closed injury to the left wrist.  The patient was seen and evaluated given an intra-articular nature displacement and unstable fracture pattern, it was recommended she undergo the above procedures. Risks, benefits, and alternatives were discussed in detail with the patient and signed informed consent was obtained.  Risks include, but not limited to bleeding, infection, damage to nearby nerves, arteries, or tendons, loss of motion of wrist and digits, incomplete relief of symptoms, nonunion, malunion, hardware failure, and need for further surgical intervention.  DESCRIPTION  OF PROCEDURE:  The patient was properly identified in the preoperative holding area and marked with a permanent marker made on left hand and left wrist to indicate the correct operative site. Patient was then brought back to the operating room, placed supine on the anesthesia room table.  General anesthesia was administered.  The patient tolerated this well.  A well-padded tourniquet was then placed on the left brachium sealed with 1000 drape.  Left upper extremity was then prepped and draped in normal sterile fashion.  Time-out was called, correct site was identified, and the procedure was then begun. Attention was then turned to the left hand where a longitudinal incision was made directly over the FCR sheath.  Limb was elevated and tourniquet was insufflated.  Dissection was carried down through the skin and subcutaneous tissue.  The patient's FCR sheath was then opened proximally and distally.  After going through the floor of the FCR sheath.  The FPL was then carefully swept all the way and L-shaped pronator quadratus flap.  The flap was then elevated.  Fracture site was then opened up.  In order to reduce the fracture site,  the brachioradialis was then carefully released off the radial styloid and tendon lengthening was then carried out and releasing it off the styloid.  Following release of the brachioradialis, attention was then turned to the open reduction, the patient did have the intra-articular  fracture three or more fragments release, open reduction was then performed.  After this was carefully held in place, reduction was then achieved and the volar plate was then applied, held distally with a K- wire.  The position was then confirmed using mini C-arm.  The oblong screw hole was then placed proximally and then distal fixation was then carried out such as the locking pegs.  Once this was achieved, then further fixation was then carried out proximally with a combination  of locking screws and nonlocking screws in the shaft and the wound was then thoroughly irrigated.  After this appropriate drilling and placement of the pegs and screws, final radiographs had been obtained in all planes using the mini C-arm.  Stress examination of the wrist was then carried out.  There was no instability of the SL ligament interval as well as the distal radioulnar joint.  The wound was irrigated.  The pronator quadratus was then closed with 2-0 Vicryl, subcutaneous tissue was closed with 4-0 Vicryl, skin closed with 4-0 Vicryl Rapide.  Adaptic dressing, sterile compressive bandage was then applied.  The patient was then placed in a well-padded sugar-tong splint, extubated, and taken to recovery room in good condition.  POSTPROCEDURE PLAN:  The patient was admitted overnight for IV antibiotics, pain control, discharged in the morning, seen back in the office in approximately 2 weeks for wound check, suture removal, application of short-arm cast.  We will begin the therapy regimen at the 4 week mark.  Radiographs at each visit.     Madelynn DoneFred W Ortmann IV, MD     FWO/MEDQ  D:  01/15/2014  T:  01/15/2014  Job:  [id number]

## 2014-01-15 NOTE — Progress Notes (Signed)
R/B/A DISCUSSED WITH PT IN OFFICE.  PT VOICED UNDERSTANDING OF PLAN CONSENT SIGNED DAY OF SURGERY PT SEEN AND EXAMINED PRIOR TO OPERATIVE PROCEDURE/DAY OF SURGERY SITE MARKED. QUESTIONS ANSWERED WILL REMAIN OVERNIGHT OBSERVATION FOLLOWING SURGERY

## 2014-01-15 NOTE — Anesthesia Postprocedure Evaluation (Signed)
  Anesthesia Post-op Note  Patient: Tara [name]  Procedure(s) Performed: Procedure(s): OPEN REDUCTION INTERNAL FIXATION (ORIF) WRIST FRACTURE (Left)  Patient Location: PACU  Anesthesia Type:GA combined with regional for post-op pain  Level of Consciousness: awake, alert , oriented and patient cooperative  Airway and Oxygen Therapy: Patient Spontanous Breathing  Post-op Pain: none  Post-op Assessment: Post-op Vital signs reviewed, Patient's Cardiovascular Status Stable, Respiratory Function Stable, Patent Airway, No signs of Nausea or vomiting and Pain level controlled  Post-op Vital Signs: Reviewed and stable  Last Vitals:  Filed Vitals:   01/15/14 0957  BP: 105/67  Pulse: 89  Temp:   Resp: 21    Complications: No apparent anesthesia complications

## 2014-01-15 NOTE — Discharge Instructions (Signed)
KEEP BANDAGE CLEAN AND DRY °CALL OFFICE FOR F/U APPT [phone] in 14 days °KEEP HAND ELEVATED ABOVE HEART °OK TO APPLY ICE TO OPERATIVE AREA °CONTACT OFFICE IF ANY WORSENING PAIN OR CONCERNS. °

## 2014-01-15 NOTE — Progress Notes (Signed)
Pt asked to have IV removed due to pain, refused to have second PIV inserted.

## 2014-01-15 NOTE — Transfer of Care (Signed)
Immediate Anesthesia Transfer of Care Note  Patient: Tara [name]  Procedure(s) Performed: Procedure(s): OPEN REDUCTION INTERNAL FIXATION (ORIF) WRIST FRACTURE (Left)  Patient Location: PACU  Anesthesia Type:General  Level of Consciousness: awake  Airway & Oxygen Therapy: Patient Spontanous Breathing  Post-op Assessment: Report given to PACU RN and Post -op Vital signs reviewed and stable  Post vital signs: Reviewed and stable  Complications: No apparent anesthesia complications

## 2014-01-15 NOTE — Anesthesia Procedure Notes (Addendum)
Anesthesia Regional Block:  Supraclavicular block  Pre-Anesthetic Checklist: ,, timeout performed, Correct Patient, Correct Site, Correct Laterality, Correct Procedure, Correct Position, site marked, Risks and benefits discussed,  Surgical consent,  Pre-op evaluation,  At surgeon's request and post-op pain management  Laterality: Left and Upper  Prep: chloraprep       Needles:  Injection technique: Single-shot  Needle Type: Echogenic Stimulator Needle      Needle Gauge: 22 and 22 G    Additional Needles:  Procedures: ultrasound guided (picture in chart) and nerve stimulator Supraclavicular block  Nerve Stimulator or Paresthesia:  Response: forearm twitch, 0.45 mA, 0.1 ms,   Additional Responses:   Narrative:  Start time: 01/15/2014 7:26 AM End time: 01/15/2014 7:41 AM Injection made incrementally with aspirations every 5 mL.  Performed by: Personally  Anesthesiologist: Sandford Craze Jackson, MD  Additional Notes: Pt identified in Holding room.  Monitors applied. Working IV access confirmed. Sterile prep L clavicle.  #22ga PNS to forearm twitch at 0.4145mA threshold with US guidance.  30cc 0.5% Bupivacaine with 1:200k epi injected incrementally after negative test dose.  Patient asymptomatic, VSS, no heme aspirated, tolerated well.  Sandford Craze Jackson, MD   Procedure Name: LMA Insertion Date/Time: 01/15/2014 8:05 AM Performed by: Alanda AmassFRIEDMAN, SCOT A Pre-anesthesia Checklist: Patient identified, Timeout performed, Emergency Drugs available, Suction available and Patient being monitored Patient Re-evaluated:Patient Re-evaluated prior to inductionOxygen Delivery Method: Circle system utilized Preoxygenation: Pre-oxygenation with 100% oxygen Intubation Type: IV induction LMA: LMA inserted LMA Size: 4.0 Tube type: Oral Number of attempts: 1 Placement Confirmation: breath sounds checked- equal and bilateral and positive ETCO2 Tube secured with: Tape Dental Injury: Teeth and Oropharynx as per  pre-operative assessment

## 2014-01-15 NOTE — Brief Op Note (Signed)
01/14/2014 - 01/15/2014  7:38 AM  PATIENT:  Olivette Weide  28 y.o. female  PRE-OPERATIVE DIAGNOSIS:  Left Wrist Fracture  POST-OPERATIVE DIAGNOSIS:  left wrist fracture  PROCEDURE:  Procedure(s): OPEN REDUCTION INTERNAL FIXATION (ORIF) WRIST FRACTURE (Left)  SURGEON:  Surgeon(s) and Role:    * Sharma CovertFred W Ortmann, MD - Primary  PHYSICIAN ASSISTANT:   ASSISTANTS: none   ANESTHESIA:   general  EBL:     BLOOD ADMINISTERED:none  DRAINS: none   LOCAL MEDICATIONS USED:  MARCAINE     SPECIMEN:  No Specimen  DISPOSITION OF SPECIMEN:  N/A  COUNTS:  YES  TOURNIQUET:    DICTATIO: [id number]: [id number]  PLAN OF CARE: Admit for overnight observation  PATIENT DISPOSITION:  PACU - hemodynamically stable.   Delay start of Pharmacological VTE agent (>24hrs) due to surgical blood loss or risk of bleeding: not applicable

## 2014-01-15 NOTE — Anesthesia Preprocedure Evaluation (Addendum)
Anesthesia Evaluation  Patient identified by MRN, date of birth, ID band Patient awake    Reviewed: Allergy & Precautions, H&P , NPO status , Patient's Chart, lab work & pertinent test results  History of Anesthesia Complications Negative for: history of anesthetic complications  Airway Mallampati: I TM Distance: >3 FB Neck ROM: Full    Dental  (+) Teeth Intact, Dental Advisory Given   Pulmonary Current Smoker,  breath sounds clear to auscultation  Pulmonary exam normal       Cardiovascular negative cardio ROS  Rhythm:Regular Rate:Normal  Pt. States has murmur.  Denies proph. abx with dental work.  Normal echo 2010   Neuro/Psych negative neurological ROS     GI/Hepatic negative GI ROS, Neg liver ROS,   Endo/Other  negative endocrine ROS  Renal/GU negative Renal ROS     Musculoskeletal   Abdominal   Peds  Hematology negative hematology ROS (+)   Anesthesia Other Findings   Reproductive/Obstetrics LMP 01/13/14                         Anesthesia Physical Anesthesia Plan  ASA: II  Anesthesia Plan: General   Post-op Pain Management:    Induction: Intravenous  Airway Management Planned: LMA  Additional Equipment:   Intra-op Plan:   Post-operative Plan:   Informed Consent: I have reviewed the patients History and Physical, chart, labs and discussed the procedure including the risks, benefits and alternatives for the proposed anesthesia with the patient or authorized representative who has indicated his/her understanding and acceptance.   Dental advisory given  Plan Discussed with: Surgeon and CRNA  Anesthesia Plan Comments: (Plan routine monitors, supraclavicular block for post op analgesia)        Anesthesia Quick Evaluation

## 2014-01-16 NOTE — Discharge Summary (Signed)
Physician Discharge Summary  Patient ID: Tara [name] MRN: [id number] DOB/AGE: September 01, 1985 28 y.o.  Admit date: 01/14/2014 Discharge date: 01/16/2014  Admission Diagnoses: Left Wrist Fracture Past Medical History  Diagnosis Date  . Preterm labor   . Heart murmur     as baby  . Urinary tract infection   . History of gonorrhea   . History of chlamydia   . NWGNFAOZ(308.6Headache(784.0)     Discharge Diagnoses:  Active Problems:   Fracture of left distal radius   Surgeries: Procedure(s): OPEN REDUCTION INTERNAL FIXATION (ORIF) WRIST FRACTURE on 01/14/2014 - 01/15/2014    Consultants:    Discharged Condition: Improved  Hospital Course: Tara [name] is an 28 y.o. female who was admitted 01/14/2014 with a chief complaint of  Chief Complaint  Patient presents with  . Joint Swelling  , and found to have a diagnosis of Left Wrist Fracture.  They were brought to the operating room on 01/14/2014 - 01/15/2014 and underwent Procedure(s): OPEN REDUCTION INTERNAL FIXATION (ORIF) WRIST FRACTURE.    They were given perioperative antibiotics: Anti-infectives   Start     Dose/Rate Route Frequency Ordered Stop   01/15/14 0600  ceFAZolin (ANCEF) IVPB 2 g/50 mL premix     2 g 100 mL/hr over 30 Minutes Intravenous On call to O.R. 01/14/14 1944 01/15/14 [id number]    .  They were given sequential compression devices, early ambulation, and Other (comment) ambulation for DVT prophylaxis.  Recent vital signs: Patient Vitals for the past 24 hrs:  BP Temp Temp src Pulse Resp SpO2  01/16/14 0604 107/54 mmHg 99 F (37.2 C) Oral 76 18 98 %  01/15/14 2020 114/70 mmHg 99.1 F (37.3 C) Oral 78 18 100 %  01/15/14 1847 116/71 mmHg 98.7 F (37.1 C) - 78 18 96 %  01/15/14 1334 100/59 mmHg 97.8 F (36.6 C) - 90 16 100 %  .  Recent laboratory studies: Dg Forearm Left  01/14/2014   CLINICAL DATA:  Larey SeatFell.  Pain and deformity.  EXAM: LEFT FOREARM - 2 VIEW  COMPARISON:  None.  FINDINGS: There is a transverse fracture of the  distal radial metaphysis with ventral angulation. There is dorsal tilt of the distal radial articular surface. Fracture line extends to the articular surface. No ulnar fracture.  IMPRESSION: Transverse fracture of the distal radial metaphysis with ventral angulation and dorsal tilt of the distal radial articular surface. Fracture line extends to the articular surface.   Electronically Signed   By: Paulina FusiMark  Shogry M.D.   On: 01/14/2014 15:33   Dg Wrist Complete Left  01/14/2014   CLINICAL DATA:  Fall.  EXAM: LEFT WRIST - COMPLETE 3+ VIEW  COMPARISON:  None.  FINDINGS: There is a displaced mildly comminuted fracture of the distal radial metaphyseal region extending to the articular surface with dorsal displacement/ angulation of the predominant distal fragment. Remainder the exam is unremarkable.  IMPRESSION: Displaced comminuted fracture of the distal radius involving the articular surface.   Electronically Signed   By: Elberta Fortisaniel  Boyle M.D.   On: 01/14/2014 15:34    Discharge Medications:     Medication List         docusate sodium 100 MG capsule  Commonly known as:  COLACE  Take 1 capsule (100 mg total) by mouth 2 (two) times daily.     methocarbamol 500 MG tablet  Commonly known as:  ROBAXIN  Take 1 tablet (500 mg total) by mouth 4 (four) times daily.     oxyCODONE-acetaminophen 5-325 MG  per tablet  Commonly known as:  ROXICET  Take 1 tablet by mouth every 4 (four) hours as needed for severe pain.     vitamin C 500 MG tablet  Commonly known as:  ASCORBIC ACID  Take 1 tablet (500 mg total) by mouth daily.        Diagnostic Studies: Dg Forearm Left  01/14/2014   CLINICAL DATA:  Larey SeatFell.  Pain and deformity.  EXAM: LEFT FOREARM - 2 VIEW  COMPARISON:  None.  FINDINGS: There is a transverse fracture of the distal radial metaphysis with ventral angulation. There is dorsal tilt of the distal radial articular surface. Fracture line extends to the articular surface. No ulnar fracture.  IMPRESSION:  Transverse fracture of the distal radial metaphysis with ventral angulation and dorsal tilt of the distal radial articular surface. Fracture line extends to the articular surface.   Electronically Signed   By: Paulina FusiMark  Shogry M.D.   On: 01/14/2014 15:33   Dg Wrist Complete Left  01/14/2014   CLINICAL DATA:  Fall.  EXAM: LEFT WRIST - COMPLETE 3+ VIEW  COMPARISON:  None.  FINDINGS: There is a displaced mildly comminuted fracture of the distal radial metaphyseal region extending to the articular surface with dorsal displacement/ angulation of the predominant distal fragment. Remainder the exam is unremarkable.  IMPRESSION: Displaced comminuted fracture of the distal radius involving the articular surface.   Electronically Signed   By: Elberta Fortisaniel  Boyle M.D.   On: 01/14/2014 15:34    They benefited maximally from their hospital stay and there were no complications.     Disposition: 01-Home or Self Care      Follow-up Information   Follow up with Sharma CovertTMANN,FRED W, MD.   Specialty:  Orthopedic Surgery   Contact information:   [address] [place] [address] [phone]        Signed: Sharma CovertORTMANN,FRED W 01/16/2014, 11:03 AM

## 2014-01-18 ENCOUNTER — Encounter (HOSPITAL_COMMUNITY): Payer: Self-pay | Admitting: Orthopedic Surgery

## 2014-05-23 ENCOUNTER — Encounter (HOSPITAL_COMMUNITY): Payer: Self-pay | Admitting: Orthopedic Surgery

## 2015-06-22 IMAGING — CR DG WRIST COMPLETE 3+V*L*
4 series · 4 of 4 positions shown · non-contrast
Comparison: None.

CLINICAL DATA: Fall.

EXAM:
LEFT WRIST - COMPLETE 3+ VIEW

[x wrist pa left]
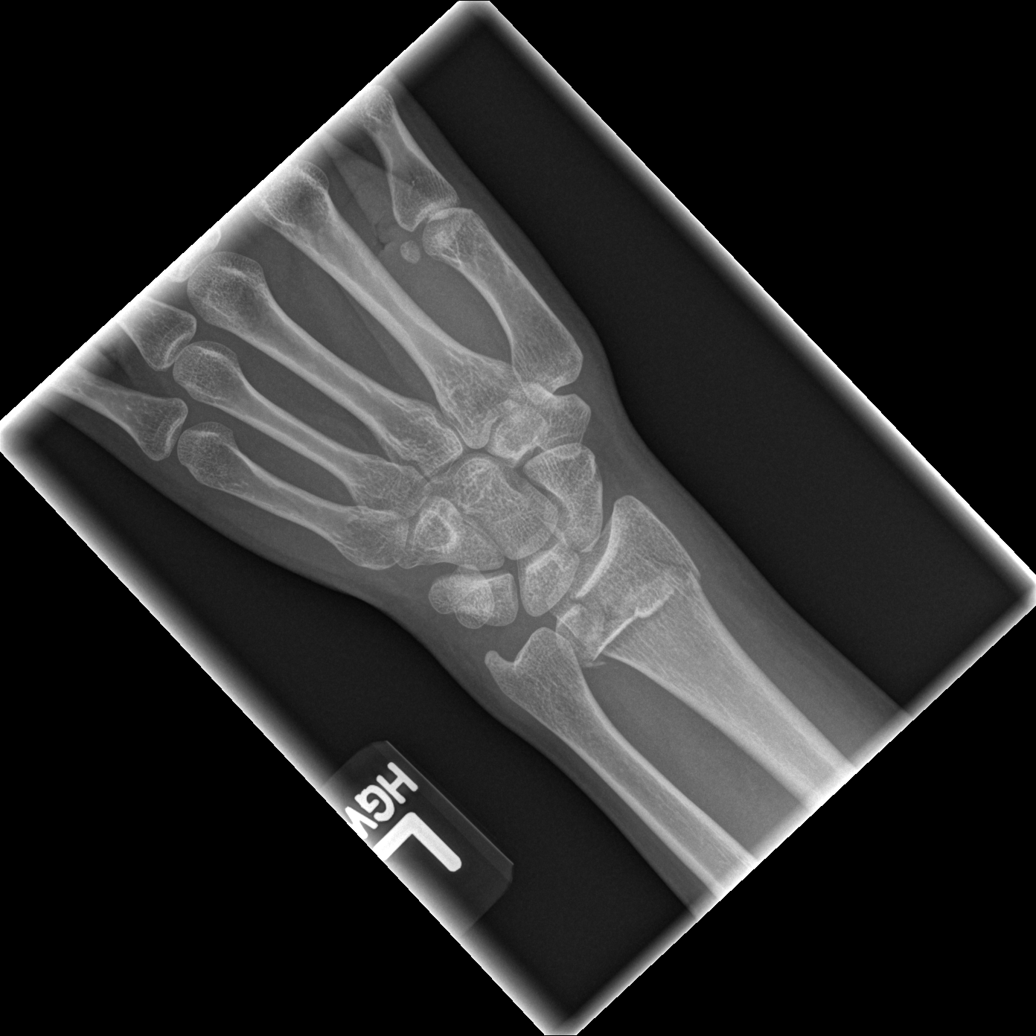

[x wrist obl left]
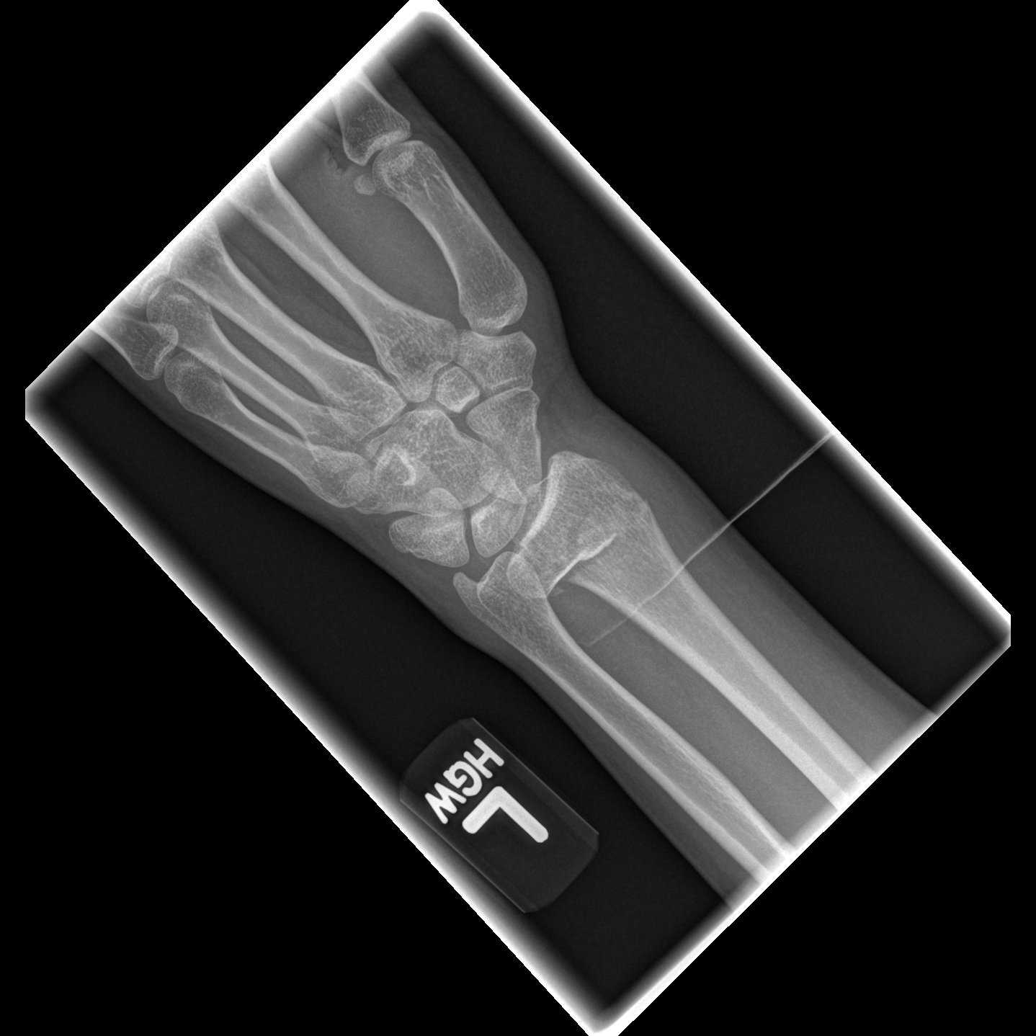

[x wrist lat left]
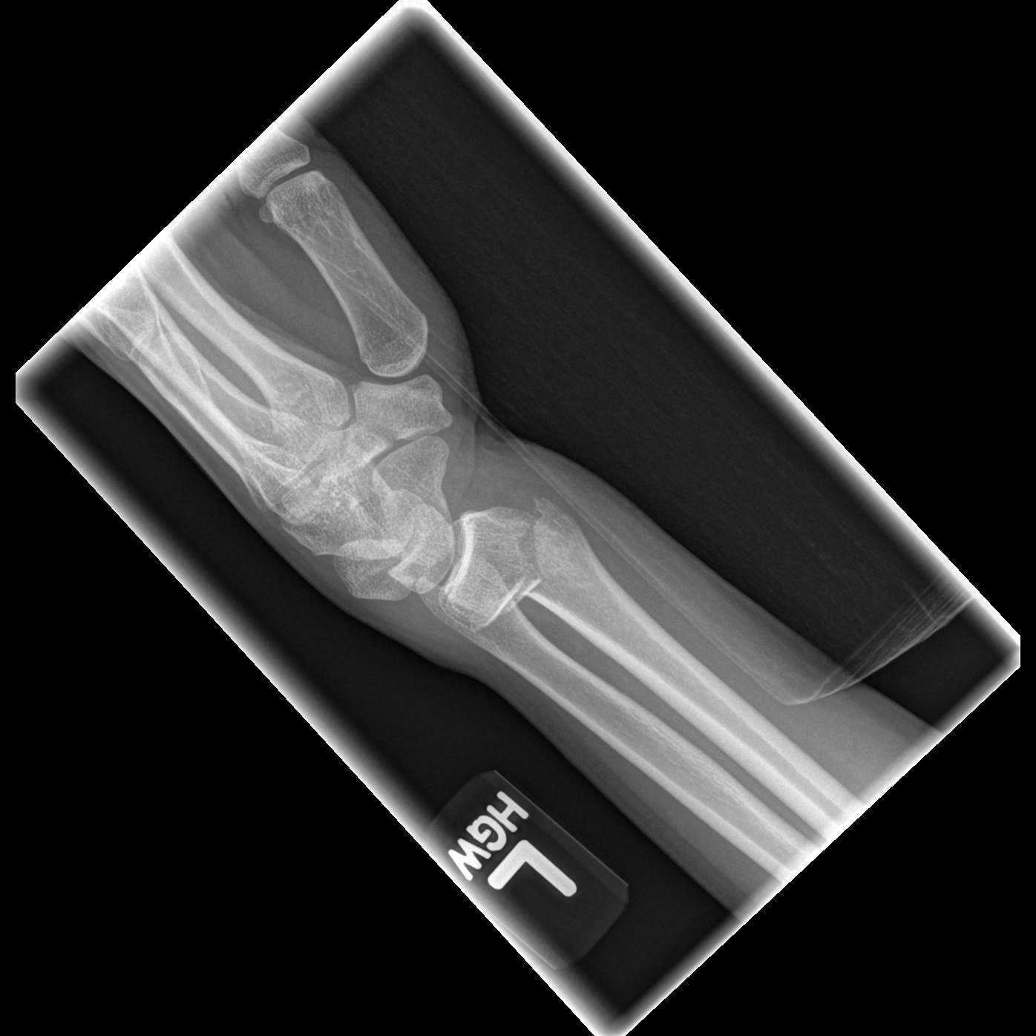

[x navicular]
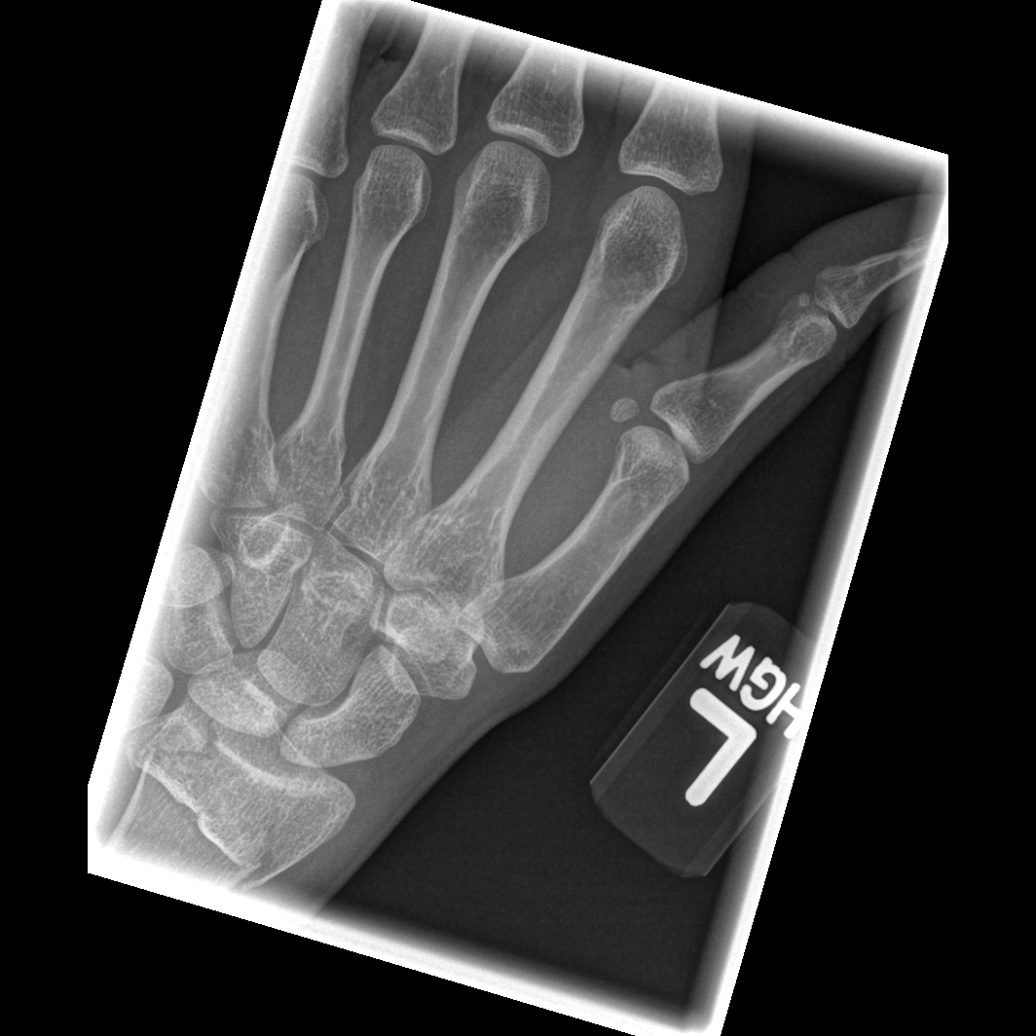

[4 of 4 positions shown; findings below may reference images not displayed]

FINDINGS: There is a displaced mildly comminuted fracture of the distal radial
metaphyseal region extending to the articular surface with dorsal
displacement/ angulation of the predominant distal fragment.
Remainder the exam is unremarkable.
IMPRESSION: Displaced comminuted fracture of the distal radius involving the
articular surface.

## 2015-09-11 ENCOUNTER — Encounter (HOSPITAL_COMMUNITY): Payer: Self-pay | Admitting: Emergency Medicine

## 2015-09-11 ENCOUNTER — Emergency Department (HOSPITAL_COMMUNITY)
Admission: EM | Admit: 2015-09-11 | Discharge: 2015-09-12 | Disposition: A | Discharge status: Home / Self Care | Payer: Medicaid Other | Attending: Emergency Medicine | Admitting: Emergency Medicine

## 2015-09-11 DIAGNOSIS — Z8619 Personal history of other infectious and parasitic diseases: Secondary | ICD-10-CM | POA: Insufficient documentation

## 2015-09-11 DIAGNOSIS — R011 Cardiac murmur, unspecified: Secondary | ICD-10-CM | POA: Insufficient documentation

## 2015-09-11 DIAGNOSIS — Z3202 Encounter for pregnancy test, result negative: Secondary | ICD-10-CM | POA: Diagnosis not present

## 2015-09-11 DIAGNOSIS — N760 Acute vaginitis: Secondary | ICD-10-CM | POA: Insufficient documentation

## 2015-09-11 DIAGNOSIS — Z87440 Personal history of urinary (tract) infections: Secondary | ICD-10-CM | POA: Insufficient documentation

## 2015-09-11 DIAGNOSIS — M79662 Pain in left lower leg: Secondary | ICD-10-CM

## 2015-09-11 DIAGNOSIS — F17210 Nicotine dependence, cigarettes, uncomplicated: Secondary | ICD-10-CM | POA: Diagnosis not present

## 2015-09-11 DIAGNOSIS — R1030 Lower abdominal pain, unspecified: Secondary | ICD-10-CM | POA: Diagnosis present

## 2015-09-11 DIAGNOSIS — B9689 Other specified bacterial agents as the cause of diseases classified elsewhere: Secondary | ICD-10-CM

## 2015-09-11 LAB — COMPREHENSIVE METABOLIC PANEL
ALK PHOS: 41 U/L (ref 38–126)
ALT: 14 U/L (ref 14–54)
AST: 21 U/L (ref 15–41)
Albumin: 4.2 g/dL (ref 3.5–5.0)
Anion gap: 9 (ref 5–15)
BUN: 11 mg/dL (ref 6–20)
CALCIUM: 9.1 mg/dL (ref 8.9–10.3)
CHLORIDE: 108 mmol/L (ref 101–111)
CO2: 22 mmol/L (ref 22–32)
CREATININE: 0.51 mg/dL (ref 0.44–1.00)
GFR calc Af Amer: >60 mL/min (ref >60)
Glucose, Bld: 90 mg/dL (ref 65–99)
Potassium: 3.8 mmol/L (ref 3.5–5.1)
Sodium: 139 mmol/L (ref 135–145)
Total Bilirubin: 0.3 mg/dL (ref 0.3–1.2)
Total Protein: 6.8 g/dL (ref 6.5–8.1)

## 2015-09-11 LAB — URINALYSIS, ROUTINE W REFLEX MICROSCOPIC
BILIRUBIN URINE: NEGATIVE
Glucose, UA: NEGATIVE mg/dL
HGB URINE DIPSTICK: NEGATIVE
KETONES UR: NEGATIVE mg/dL
Leukocytes, UA: NEGATIVE
Nitrite: NEGATIVE
Protein, ur: NEGATIVE mg/dL
Specific Gravity, Urine: 1.023 (ref 1.005–1.030)
pH: 7 (ref 5.0–8.0)

## 2015-09-11 LAB — WET PREP, GENITAL
SPERM: NONE SEEN
TRICH WET PREP: NONE SEEN
YEAST WET PREP: NONE SEEN

## 2015-09-11 LAB — LIPASE, BLOOD: LIPASE: 22 U/L (ref 11–51)

## 2015-09-11 LAB — CBC
HCT: 35 % — ABNORMAL LOW (ref 36.0–46.0)
Hemoglobin: 11.5 g/dL — ABNORMAL LOW (ref 12.0–15.0)
MCH: 30.8 pg (ref 26.0–34.0)
MCHC: 32.9 g/dL (ref 30.0–36.0)
MCV: 93.8 fL (ref 78.0–100.0)
PLATELETS: 246 10*3/uL (ref 150–400)
RBC: 3.73 MIL/uL — ABNORMAL LOW (ref 3.87–5.11)
RDW: 12.4 % (ref 11.5–15.5)
WBC: 4 10*3/uL (ref 4.0–10.5)

## 2015-09-11 LAB — PREGNANCY, URINE: Preg Test, Ur: NEGATIVE

## 2015-09-11 NOTE — ED Notes (Signed)
Pt from home for eval of left leg pain x4 days, denies any swelling or reddness, denies any recent travel of hx of DVT. Pt also reports generalized abd pain with foul smelling vaginal discharge. Denies any n/v/d or fevers. nad ntoed.

## 2015-09-11 NOTE — ED Provider Notes (Signed)
CSN: [id number]     Arrival date & time 09/11/15  1608 History   First MD Initiated Contact with Patient 09/11/15 2220     Chief Complaint  Patient presents with  . Leg Pain  . Abdominal Pain     Patient is a 30 y.o. female presenting with leg pain and abdominal pain. The history is provided by the patient. No language interpreter was used.  Leg Pain Abdominal Pain  Tara [name] is a 30 y.o. female who presents to the Emergency Department complaining of Vaginal discharge and leg pain. In terms of some discharge this is present for the last week. For the last 2 days she reports diffuse lower abdominal pain. She also reports 4 days of left calf cramping and swelling. No injuries to her leg or abdomen. She denies fevers, vomiting, nausea, diarrhea, dysuria. Symptoms are moderate and constant.  Past Medical History  Diagnosis Date  . Preterm labor   . Heart murmur     as baby  . Urinary tract infection   . History of gonorrhea   . History of chlamydia   . JXBJYNWG(956.2)    Past Surgical History  Procedure Laterality Date  . Induced abortion    . Dilation and curettage of uterus    . Laparoscopic tubal ligation  01/09/2012    Procedure: LAPAROSCOPIC TUBAL LIGATION;  Surgeon: Kathreen Cosier, MD;  Location: [facility];  Service: Gynecology;  Laterality: Bilateral;  . Orif wrist fracture Left 01/15/2014    Procedure: OPEN REDUCTION INTERNAL FIXATION (ORIF) WRIST FRACTURE;  Surgeon: Sharma Covert, MD;  Location: [facility] OR;  Service: Orthopedics;  Laterality: Left;   Family History  Problem Relation Age of Onset  . Anesthesia problems Neg Hx   . Hearing loss Neg Hx    Social History  Substance Use Topics  . Smoking status: Current Every Day Smoker -- 0.25 packs/day    Types: Cigarettes  . Smokeless tobacco: Never Used     Comment: quit with preg  . Alcohol Use: No   OB History    Gravida Para Term Preterm AB TAB SAB Ectopic Multiple Living   Review of  Systems  Gastrointestinal: Positive for abdominal pain.  All other systems reviewed and are negative.     Allergies  Review of patient's allergies indicates no known allergies.  Home Medications   Prior to Admission medications   Medication Sig Start Date End Date Taking? Authorizing Provider  metroNIDAZOLE (FLAGYL) 500 MG tablet Take 1 tablet (500 mg total) by mouth 2 (two) times daily. 09/12/15   Tilden Fossa, MD   BP 100/65 mmHg  Pulse 67  Temp(Src) 98.1 F (36.7 C) (Oral)  Resp 16  Ht  (1.575 m)  SpO2 100%  LMP 07/25/2015 (Approximate) Physical Exam  Constitutional: She is oriented to person, place, and time. She appears well-developed and well-nourished.  HENT:  Head: Normocephalic and atraumatic.  Cardiovascular: Normal rate and regular rhythm.   No murmur heard. Pulmonary/Chest: Effort normal and breath sounds normal. No respiratory distress.  Abdominal: Soft. There is no rebound and no guarding.  Mild diffuse abdominal tenderness  Genitourinary:  Moderate watery vaginal discharge, mild cervical motion tenderness. No adnexal tenderness or mass.  Musculoskeletal: She exhibits no edema.  Mild tenderness over the left posterior calf. 2+ DP pulses. No significant swelling or rash to the leg.  Neurological: She is alert and oriented to person,  place, and time.  Skin: Skin is warm and dry.  Psychiatric: She has a normal mood and affect. Her behavior is normal.  Nursing note and vitals reviewed.   ED Course  Procedures (including critical care time) Labs Review Labs Reviewed  WET PREP, GENITAL - Abnormal; Notable for the following:    Clue Cells Wet Prep HPF POC PRESENT (*)    WBC, Wet Prep HPF POC MANY (*)    All other components within normal limits  CBC - Abnormal; Notable for the following:    RBC 3.73 (*)    Hemoglobin 11.5 (*)    HCT 35.0 (*)    All other components within normal limits  LIPASE, BLOOD  COMPREHENSIVE METABOLIC PANEL  URINALYSIS,  ROUTINE W REFLEX MICROSCOPIC (NOT AT [facility])  D-DIMER, QUANTITATIVE (NOT AT [facility])  PREGNANCY, URINE  GC/CHLAMYDIA PROBE AMP ([place]) NOT AT [facility]    Imaging Review No results found. I have personally reviewed and evaluated these images and lab results as part of my medical decision-making.   EKG Interpretation None      MDM   Final diagnoses:  BV (bacterial vaginosis)  Calf pain, left    Pt here with vaginal discharge, left calf pain.  Presentation not c/w PID, TOA, torsion, appendicitis.  Calf not c/w infection, DVT.  Discussed treatment for calf pain with ibuprofen, BV with flagyl.  Discussed outpatient follow up, return precautions.     Tilden Fossa, MD 09/12/15 [phone]

## 2015-09-11 NOTE — ED Notes (Signed)
Was next to go back, no answer. Pt stepped off ER waiting room - went to [facility].

## 2015-09-12 LAB — GC/CHLAMYDIA PROBE AMP (~~LOC~~) NOT AT ARMC
CHLAMYDIA, DNA PROBE: NEGATIVE
NEISSERIA GONORRHEA: NEGATIVE

## 2015-09-12 LAB — D-DIMER, QUANTITATIVE: D-Dimer, Quant: <0.27 ug/mL-FEU (ref 0.00–0.50)

## 2015-09-12 MED ORDER — METRONIDAZOLE 500 MG PO TABS: 500.0000 mg | ORAL_TABLET | Freq: Two times a day (BID) | ORAL | Status: DC

## 2015-09-12 NOTE — Discharge Instructions (Signed)

## 2016-03-08 ENCOUNTER — Encounter (HOSPITAL_COMMUNITY): Payer: Self-pay | Admitting: Emergency Medicine

## 2016-03-08 ENCOUNTER — Emergency Department (HOSPITAL_COMMUNITY): Payer: Medicaid Other

## 2016-03-08 ENCOUNTER — Emergency Department (HOSPITAL_COMMUNITY)
Admission: EM | Admit: 2016-03-08 | Discharge: 2016-03-08 | Disposition: A | Discharge status: Home / Self Care | Payer: Medicaid Other | Attending: Emergency Medicine | Admitting: Emergency Medicine

## 2016-03-08 DIAGNOSIS — R002 Palpitations: Secondary | ICD-10-CM | POA: Insufficient documentation

## 2016-03-08 DIAGNOSIS — F17210 Nicotine dependence, cigarettes, uncomplicated: Secondary | ICD-10-CM | POA: Insufficient documentation

## 2016-03-08 LAB — COMPREHENSIVE METABOLIC PANEL
ALBUMIN: 3.9 g/dL (ref 3.5–5.0)
ALT: 7 U/L — ABNORMAL LOW (ref 14–54)
ANION GAP: 3 — AB (ref 5–15)
AST: 13 U/L — AB (ref 15–41)
Alkaline Phosphatase: 37 U/L — ABNORMAL LOW (ref 38–126)
BUN: 20 mg/dL (ref 6–20)
CHLORIDE: 110 mmol/L (ref 101–111)
CO2: 25 mmol/L (ref 22–32)
Calcium: 8.8 mg/dL — ABNORMAL LOW (ref 8.9–10.3)
Creatinine, Ser: 0.59 mg/dL (ref 0.44–1.00)
GFR calc Af Amer: >60 mL/min (ref >60)
GFR calc non Af Amer: >60 mL/min (ref >60)
GLUCOSE: 78 mg/dL (ref 65–99)
POTASSIUM: 3.8 mmol/L (ref 3.5–5.1)
SODIUM: 138 mmol/L (ref 135–145)
Total Bilirubin: 0.5 mg/dL (ref 0.3–1.2)
Total Protein: 6.3 g/dL — ABNORMAL LOW (ref 6.5–8.1)

## 2016-03-08 LAB — CBC WITH DIFFERENTIAL/PLATELET
BASOS ABS: 0 10*3/uL (ref 0.0–0.1)
BASOS PCT: 0 %
EOS ABS: 0.1 10*3/uL (ref 0.0–0.7)
EOS PCT: 2 %
HEMATOCRIT: 34.1 % — AB (ref 36.0–46.0)
Hemoglobin: 11.1 g/dL — ABNORMAL LOW (ref 12.0–15.0)
Lymphocytes Relative: 42 %
Lymphs Abs: 1.8 10*3/uL (ref 0.7–4.0)
MCH: 30.7 pg (ref 26.0–34.0)
MCHC: 32.6 g/dL (ref 30.0–36.0)
MCV: 94.2 fL (ref 78.0–100.0)
MONO ABS: 0.4 10*3/uL (ref 0.1–1.0)
Monocytes Relative: 8 %
NEUTROS ABS: 2 10*3/uL (ref 1.7–7.7)
Neutrophils Relative %: 48 %
PLATELETS: 270 10*3/uL (ref 150–400)
RBC: 3.62 MIL/uL — ABNORMAL LOW (ref 3.87–5.11)
RDW: 12.3 % (ref 11.5–15.5)
WBC: 4.3 10*3/uL (ref 4.0–10.5)

## 2016-03-08 LAB — I-STAT TROPONIN, ED: Troponin i, poc: 0 ng/mL (ref 0.00–0.08)

## 2016-03-08 LAB — D-DIMER, QUANTITATIVE: D-Dimer, Quant: <0.27 ug/mL-FEU (ref 0.00–0.50)

## 2016-03-08 NOTE — Discharge Instructions (Signed)
[facility],  You can see Dr. Excell Seltzerooper or one of his partners.   Stay away from caffeine alcohol and cigarettes

## 2016-03-08 NOTE — ED Triage Notes (Signed)
Pt arrives from home via [facility] reporting new onset heart palpitations with L sided intermittent CP.  Pt reports SOb when chest is hurting, denies N/V, dizziness. NAD noted at this time, AOx4.

## 2016-03-08 NOTE — ED Notes (Signed)
Pt verbalized understanding of discharge instructions and follow-up care. No further questions at this time.

## 2016-03-08 NOTE — ED Notes (Signed)
MD at bedside. 

## 2016-03-08 NOTE — ED Provider Notes (Signed)
[facility] DEPT Provider Note   CSN: [id number] Arrival date & time: 03/08/16  1549     History   Chief Complaint Chief Complaint  Patient presents with  . Palpitations  . Chest Pain    HPI Tara [name] is a 30 y.o. female.  Patient complains of palpitations for the last week off and on. Patient has not been taking her heart rate   The history is provided by the patient. No language interpreter was used.  Palpitations   This is a new problem. The current episode started less than 1 hour ago. The problem occurs every several days. The problem has been resolved. The problem is associated with stress. On average, each episode lasts 15 minutes. Pertinent negatives include no chest pain, no abdominal pain, no headaches, no back pain and no cough. She has tried nothing for the symptoms. There are no known risk factors. Her past medical history does not include hyperthyroidism.  Chest Pain   Associated symptoms include palpitations. Pertinent negatives include no abdominal pain, no back pain, no cough and no headaches.  Pertinent negatives for past medical history include no seizures.    Past Medical History:  Diagnosis Date  . Headache(784.0)   . Heart murmur    as baby  . History of chlamydia   . History of gonorrhea   . Preterm labor   . Urinary tract infection     Patient Active Problem List   Diagnosis Date Noted  . Fracture of left distal radius 01/14/2014  . Nausea and vomiting of pregnancy, antepartum 03/11/2011    Past Surgical History:  Procedure Laterality Date  . DILATION AND CURETTAGE OF UTERUS    . INDUCED ABORTION    . LAPAROSCOPIC TUBAL LIGATION  01/09/2012   Procedure: LAPAROSCOPIC TUBAL LIGATION;  Surgeon: Kathreen CosierBernard A Marshall, MD;  Location: [facility];  Service: Gynecology;  Laterality: Bilateral;  . ORIF WRIST FRACTURE Left 01/15/2014   Procedure: OPEN REDUCTION INTERNAL FIXATION (ORIF) WRIST FRACTURE;  Surgeon: Sharma CovertFred W Ortmann, MD;  Location: [facility] OR;   Service: Orthopedics;  Laterality: Left;    OB History    Gravida Para Term Preterm AB Living   4 2 2   2 2    SAB TAB Ectopic Multiple Live Births     2     2       Home Medications    Prior to Admission medications   Medication Sig Start Date End Date Taking? Authorizing Provider  metroNIDAZOLE (FLAGYL) 500 MG tablet Take 1 tablet (500 mg total) by mouth 2 (two) times daily. 09/12/15   Tilden FossaElizabeth Rees, MD    Family History Family History  Problem Relation Age of Onset  . Anesthesia problems Neg Hx   . Hearing loss Neg Hx     Social History Social History  Substance Use Topics  . Smoking status: Current Every Day Smoker    Packs/day: 0.25    Types: Cigarettes  . Smokeless tobacco: Never Used     Comment: quit with preg  . Alcohol use No     Allergies   Review of patient's allergies indicates no known allergies.   Review of Systems Review of Systems  Constitutional: Negative for appetite change and fatigue.  HENT: Negative for congestion, ear discharge and sinus pressure.   Eyes: Negative for discharge.  Respiratory: Negative for cough.   Cardiovascular: Positive for palpitations. Negative for chest pain.  Gastrointestinal: Negative for abdominal pain and diarrhea.  Genitourinary: Negative for frequency and hematuria.  Musculoskeletal: Negative for back pain.  Skin: Negative for rash.  Neurological: Negative for seizures and headaches.  Psychiatric/Behavioral: Negative for hallucinations.     Physical Exam Updated Vital Signs BP 96/66   Pulse 97   Temp 98.2 F (36.8 C) (Oral)   Resp 23   Ht 5\' 2"  (1.575 m)   Wt 113 lb (51.3 kg)   LMP 03/07/2016 (Exact Date)   SpO2 100%   BMI 20.67 kg/m   Physical Exam  Constitutional: She is oriented to person, place, and time. She appears well-developed.  HENT:  Head: Normocephalic.  Eyes: Conjunctivae and EOM are normal. No scleral icterus.  Neck: Neck supple. No thyromegaly present.  Cardiovascular: Normal  rate and regular rhythm.  Exam reveals no gallop and no friction rub.   No murmur heard. Pulmonary/Chest: No stridor. She has no wheezes. She has no rales. She exhibits no tenderness.  Abdominal: She exhibits no distension. There is no tenderness. There is no rebound.  Musculoskeletal: Normal range of motion. She exhibits no edema.  Lymphadenopathy:    She has no cervical adenopathy.  Neurological: She is oriented to person, place, and time. She exhibits normal muscle tone. Coordination normal.  Skin: No rash noted. No erythema.  Psychiatric: She has a normal mood and affect. Her behavior is normal.     ED Treatments / Results  Labs (all labs ordered are listed, but only abnormal results are displayed) Labs Reviewed  CBC WITH DIFFERENTIAL/PLATELET - Abnormal; Notable for the following:       Result Value   RBC 3.62 (*)    Hemoglobin 11.1 (*)    HCT 34.1 (*)    All other components within normal limits  COMPREHENSIVE METABOLIC PANEL - Abnormal; Notable for the following:    Calcium 8.8 (*)    Total Protein 6.3 (*)    AST 13 (*)    ALT 7 (*)    Alkaline Phosphatase 37 (*)    Anion gap 3 (*)    All other components within normal limits  D-DIMER, QUANTITATIVE (NOT AT [facility])  I-STAT TROPOININ, ED    EKG  EKG Interpretation  Date/Time:  Friday March 08 2016 15:53:13 EDT Ventricular Rate:  105 PR Interval:    QRS Duration: 80 QT Interval:  337 QTC Calculation: 446 R Axis:   73 Text Interpretation:  Junctional tachycardia Baseline wander in lead(s) II aVF Confirmed by Eunice Winecoff  MD, [facility] [phone]) on 03/08/2016 7:18:12 PM       Radiology Dg Chest 2 View  Result Date: 03/08/2016 CLINICAL DATA:  Palpitations.  Pain in lower left chest. EXAM: CHEST  2 VIEW COMPARISON:  None. FINDINGS: No pneumothorax. The heart, hila, and mediastinum are normal. Hyperinflation of the lungs. No other acute abnormalities. IMPRESSION: Hyperinflation. In a patient of this age, this could represent  an aggressive inspiratory effort. However, air trapping is not excluded on this single study. Electronically Signed   By: Gerome Sam III M.D   On: 03/08/2016 17:39    Procedures Procedures (including critical care time)  Medications Ordered in ED Medications - No data to display   Initial Impression / Assessment and Plan / ED Course  I have reviewed the triage vital signs and the nursing notes.  Pertinent labs & imaging results that were available during my care of the patient were reviewed by me and considered in my medical decision making (see chart for details).  Clinical Course    Labs unremarkable chest x-ray unremarkable. First EKG  did show junctional beats. Second EKG shows normal sinus rhythm. Patient with a history of palpitations she is instructed to stop caffeine alcohol and cigarettes and she is referred to cardiology for possible further tests  Final Clinical Impressions(s) / ED Diagnoses   Final diagnoses:  Palpitations    New Prescriptions New Prescriptions   No medications on file     Bethann BerkshireJoseph Jahzir Strohmeier, MD 03/08/16 2015

## 2016-09-30 ENCOUNTER — Emergency Department (HOSPITAL_COMMUNITY)
Admission: EM | Admit: 2016-09-30 | Discharge: 2016-09-30 | Disposition: A | Discharge status: Home / Self Care | Payer: Medicaid Other | Attending: Emergency Medicine | Admitting: Emergency Medicine

## 2016-09-30 ENCOUNTER — Emergency Department (HOSPITAL_COMMUNITY): Payer: Medicaid Other

## 2016-09-30 ENCOUNTER — Encounter (HOSPITAL_COMMUNITY): Payer: Self-pay | Admitting: Emergency Medicine

## 2016-09-30 DIAGNOSIS — M722 Plantar fascial fibromatosis: Secondary | ICD-10-CM | POA: Insufficient documentation

## 2016-09-30 DIAGNOSIS — F17210 Nicotine dependence, cigarettes, uncomplicated: Secondary | ICD-10-CM | POA: Insufficient documentation

## 2016-09-30 DIAGNOSIS — M79671 Pain in right foot: Secondary | ICD-10-CM | POA: Diagnosis present

## 2016-09-30 DIAGNOSIS — Z79899 Other long term (current) drug therapy: Secondary | ICD-10-CM | POA: Diagnosis not present

## 2016-09-30 MED ORDER — DICLOFENAC SODIUM 50 MG PO TBEC
50.0000 mg | DELAYED_RELEASE_TABLET | Freq: Two times a day (BID) | ORAL | 0 refills | Status: DC
Start: 2016-09-30 — End: 2018-04-28

## 2016-09-30 NOTE — ED Notes (Signed)
Pt c/o right foot pain starting 2 weeks ago. Pt states she has increased her walking. No injuries noted. Pt ambulates with steady gait. See providers assessment.

## 2016-09-30 NOTE — ED Notes (Signed)
Pt stable, understands discharge instructions, and reasons for return.   

## 2016-09-30 NOTE — ED Triage Notes (Signed)
Pt c/o pain across top of right foot, onset approx 2 weeks ago.  Pt denies any injury, pt ambulatory without any problems

## 2016-09-30 NOTE — ED Provider Notes (Signed)
[facility]-EMERG[facility]Y DEPT Provider Note   CSN: [id number] Arrival date & time: 09/30/16  1715  By signing my name below, I, Linna Darner, attest that this documentation has been prepared under the direction and in the presence of non-physician practitioner, [facility] M. Damian Leavell, NP. Electronically Signed: Linna Darner, Scribe. 09/30/2016. 5:44 PM.  History   Chief Complaint Chief Complaint  Patient presents with  . Foot Pain    The history is provided by the patient. No language interpreter was used.  Foot Pain  The current episode started more than 1 week ago. The problem occurs constantly. The problem has been gradually worsening. Pertinent negatives include no chest pain, no abdominal pain, no headaches and no shortness of breath. The symptoms are aggravated by walking and standing. The symptoms are relieved by acetaminophen and NSAIDs. She has tried acetaminophen (& NSAIDs) for the symptoms. The treatment provided mild relief.    HPI Comments: Tara [name] is a 31 y.o. female who presents to the Emergency Department complaining of constant, gradually worsening right foot pain for several weeks. She describes her pain as sharp, burning, and shooting in nature. She states her pain is worse with bearing weight on her right foot and ambulating. Pt is on her feet for long periods of time at work and notes this has worsened her right foot pain. Pt notes she wears the same shoes every day and denies any recent changes in her footwear. She has tried Tylenol and ibuprofen with mild improvement of her foot pain. She denies left foot problems, joint swelling, numbness/tingling, or any other associated symptoms.  Past Medical History:  Diagnosis Date  . Headache(784.0)   . Heart murmur    as baby  . History of chlamydia   . History of gonorrhea   . Preterm labor   . Urinary tract infection     Patient Active Problem List   Diagnosis Date Noted  . Fracture of left distal radius 01/14/2014  .  Nausea and vomiting of pregnancy, antepartum 03/11/2011    Past Surgical History:  Procedure Laterality Date  . DILATION AND CURETTAGE OF UTERUS    . INDUCED ABORTION    . LAPAROSCOPIC TUBAL LIGATION  01/09/2012   Procedure: LAPAROSCOPIC TUBAL LIGATION;  Surgeon: Kathreen Cosier, MD;  Location: [facility];  Service: Gynecology;  Laterality: Bilateral;  . ORIF WRIST FRACTURE Left 01/15/2014   Procedure: OPEN REDUCTION INTERNAL FIXATION (ORIF) WRIST FRACTURE;  Surgeon: Sharma Covert, MD;  Location: [facility];  Service: Orthopedics;  Laterality: Left;    OB History    Gravida Para Term Preterm AB Living   4 2 2   2 2    SAB TAB Ectopic Multiple Live Births     2     2       Home Medications    Prior to Admission medications   Medication Sig Start Date End Date Taking? Authorizing Provider  diclofenac (VOLTAREN) 50 MG EC tablet Take 1 tablet (50 mg total) by mouth 2 (two) times daily. 09/30/16   Theressa Piedra Orlene Och, NP  metroNIDAZOLE (FLAGYL) 500 MG tablet Take 1 tablet (500 mg total) by mouth 2 (two) times daily. 09/12/15   Tilden Fossa, MD    Family History Family History  Problem Relation Age of Onset  . Anesthesia problems Neg Hx   . Hearing loss Neg Hx     Social History Social History  Substance Use Topics  . Smoking status: Current Every Day Smoker    Packs/day: 0.25  Types: Cigarettes  . Smokeless tobacco: Never Used     Comment: quit with preg  . Alcohol use No     Allergies   Patient has no known allergies.   Review of Systems Review of Systems  Respiratory: Negative for shortness of breath.   Cardiovascular: Negative for chest pain.  Gastrointestinal: Negative for abdominal pain.  Musculoskeletal: Positive for arthralgias. Negative for joint swelling.       Right foot pain  Neurological: Negative for numbness and headaches.  All other systems reviewed and are negative.   Physical Exam Updated Vital Signs BP 104/58 (BP Location: Right Arm)   Pulse 77    Temp 98.1 F (36.7 C) (Oral)   Resp 18   Ht 5\' 2"  (1.575 m)   Wt 50.8 kg   LMP 09/07/2016 (Approximate)   SpO2 100%   BMI 20.49 kg/m   Physical Exam  Constitutional: She is oriented to person, place, and time. She appears well-developed and well-nourished. No distress.  HENT:  Head: Normocephalic and atraumatic.  Eyes: EOM are normal.  Neck: Neck supple. No tracheal deviation present.  Cardiovascular: Normal rate.   Pulmonary/Chest: Effort normal. No respiratory distress.  Musculoskeletal: Normal range of motion.  Right foot: Pedal pulse is 2+. Adequate circulation. Mildly tender at ball of right foot, pain radiates to the arch of the foot. Plantar flexion and dorsiflexion without difficulty.  Neurological: She is alert and oriented to person, place, and time.  Strength equal in bilateral feet.  Skin: Skin is warm and dry.  Psychiatric: She has a normal mood and affect. Her behavior is normal.  Nursing note and vitals reviewed.   ED Treatments / Results  Labs (all labs ordered are listed, but only abnormal results are displayed) Labs Reviewed - No data to display  Radiology Dg Foot Complete Right  Result Date: 09/30/2016 CLINICAL DATA:  Medial right foot pain for 2 weeks EXAM: RIGHT FOOT COMPLETE - 3+ VIEW COMPARISON:  07/10/2013 FINDINGS: There is no evidence of fracture or dislocation. There is no evidence of arthropathy or other focal bone abnormality. Soft tissues are unremarkable. IMPRESSION: Negative. Electronically Signed   By: Jasmine Pang M.D.   On: 09/30/2016 18:12    Procedures Procedures (including critical care time)  DIAGNOSTIC STUDIES: Oxygen Saturation is 100% on RA, normal by my interpretation.    COORDINATION OF CARE: 5:51 PM Discussed treatment plan with pt at bedside and pt agreed to plan.  Medications Ordered in ED Medications - No data to display   Initial Impression / Assessment and Plan / ED Course  I have reviewed the triage vital signs  and the nursing notes.  Pertinent imaging results that were available during my care of the patient were reviewed by me and considered in my medical decision making (see chart for details).  Final Clinical Impressions(s) / ED Diagnoses  31 y.o. female with right foot pain stable for d/c without fracture or dislocation noted on x-rays. Will treat for plantar fasciitis. Discussed plan of care with the patient and she voices understanding and agrees with plan.   Final diagnoses:  Plantar fasciitis of right foot    New Prescriptions Discharge Medication List as of 09/30/2016  6:26 PM    START taking these medications   Details  diclofenac (VOLTAREN) 50 MG EC tablet Take 1 tablet (50 mg total) by mouth 2 (two) times daily., Starting Mon 09/30/2016, Print      I personally performed the services described in this documentation,  which was scribed in my presence. The recorded information has been reviewed and is accurate.    [address] ClementM Christian Treadway, [place] 10/01/16 [id number]    Mancel BaleElliott Wentz, MD 10/03/16 [phone]

## 2016-11-13 ENCOUNTER — Ambulatory Visit (HOSPITAL_COMMUNITY)
Admission: EM | Admit: 2016-11-13 | Discharge: 2016-11-13 | Disposition: A | Discharge status: Home / Self Care | Payer: Medicaid Other | Attending: Family Medicine | Admitting: Family Medicine

## 2016-11-13 ENCOUNTER — Encounter (HOSPITAL_COMMUNITY): Payer: Self-pay | Admitting: Emergency Medicine

## 2016-11-13 DIAGNOSIS — G44209 Tension-type headache, unspecified, not intractable: Secondary | ICD-10-CM

## 2016-11-13 DIAGNOSIS — B349 Viral infection, unspecified: Secondary | ICD-10-CM

## 2016-11-13 DIAGNOSIS — J069 Acute upper respiratory infection, unspecified: Secondary | ICD-10-CM | POA: Diagnosis not present

## 2016-11-13 MED ORDER — IPRATROPIUM BROMIDE 0.06 % NA SOLN: 2.0000 | Freq: Four times a day (QID) | NASAL | 0 refills | Status: DC

## 2016-11-13 MED ORDER — KETOROLAC TROMETHAMINE 60 MG/2ML IM SOLN
60.0000 mg | Freq: Once | INTRAMUSCULAR | Status: AC
  Administered 2016-11-13: 60 mg via INTRAMUSCULAR

## 2016-11-13 MED ORDER — KETOROLAC TROMETHAMINE 60 MG/2ML IM SOLN
INTRAMUSCULAR | Status: AC
  Filled 2016-11-13: qty 2

## 2016-11-13 NOTE — ED Triage Notes (Signed)
Pt complains of headache and vomiting x2 upon waking up this morning.  Pt has no hx of headaches.

## 2016-11-13 NOTE — ED Provider Notes (Signed)
CSN: [id number]     Arrival date & time 11/13/16  1010 History   First MD Initiated Contact with Patient 11/13/16 1106     Chief Complaint  Patient presents with  . Headache  . Emesis   (Consider location/radiation/quality/duration/timing/severity/associated sxs/prior Treatment) Patient c/o headache x 2 days.  She was having nausea but it has resolved.  She is having congestion and uri sx's   The history is provided by the patient.  Headache  Pain location:  Generalized Quality:  Dull Severity currently:  5/10 Severity at highest:  5/10 Onset quality:  Sudden Duration:  2 days Timing:  Constant Chronicity:  New Similar to prior headaches: yes   Context: activity   Relieved by:  Nothing Worsened by:  Nothing Associated symptoms: congestion and vomiting   Emesis  Associated symptoms: headaches     Past Medical History:  Diagnosis Date  . Headache(784.0)   . Heart murmur    as baby  . History of chlamydia   . History of gonorrhea   . Preterm labor   . Urinary tract infection    Past Surgical History:  Procedure Laterality Date  . DILATION AND CURETTAGE OF UTERUS    . INDUCED ABORTION    . LAPAROSCOPIC TUBAL LIGATION  01/09/2012   Procedure: LAPAROSCOPIC TUBAL LIGATION;  Surgeon: Kathreen Cosier, MD;  Location: [facility];  Service: Gynecology;  Laterality: Bilateral;  . ORIF WRIST FRACTURE Left 01/15/2014   Procedure: OPEN REDUCTION INTERNAL FIXATION (ORIF) WRIST FRACTURE;  Surgeon: Sharma Covert, MD;  Location: [facility];  Service: Orthopedics;  Laterality: Left;   Family History  Problem Relation Age of Onset  . Anesthesia problems Neg Hx   . Hearing loss Neg Hx    Social History  Substance Use Topics  . Smoking status: Current Every Day Smoker    Packs/day: 0.25    Types: Cigarettes  . Smokeless tobacco: Never Used     Comment: quit with preg  . Alcohol use No   OB History    Gravida Para Term Preterm AB Living   SAB TAB Ectopic Multiple Live  Births     2     2     Review of Systems  Constitutional: Negative.   HENT: Positive for congestion.   Eyes: Negative.   Respiratory: Negative.   Cardiovascular: Negative.   Gastrointestinal: Positive for vomiting.  Endocrine: Negative.   Genitourinary: Negative.   Musculoskeletal: Negative.   Allergic/Immunologic: Negative.   Neurological: Positive for headaches.  Hematological: Negative.   Psychiatric/Behavioral: Negative.     Allergies  Patient has no known allergies.  Home Medications   Prior to Admission medications   Medication Sig Start Date End Date Taking? Authorizing Provider  diclofenac (VOLTAREN) 50 MG EC tablet Take 1 tablet (50 mg total) by mouth 2 (two) times daily. 09/30/16   Hope Orlene Och, NP  ipratropium (ATROVENT) 0.06 % nasal spray Place 2 sprays into both nostrils 4 (four) times daily. 11/13/16   Deatra Canter, FNP  metroNIDAZOLE (FLAGYL) 500 MG tablet Take 1 tablet (500 mg total) by mouth 2 (two) times daily. 09/12/15   Tilden Fossa, MD   Meds Ordered and Administered this Visit   Medications  ketorolac (TORADOL) injection 60 mg (not administered)    BP 100/63 (BP Location: Right Arm)   Pulse 69   Temp 98.5 F (36.9 C) (Oral)   LMP 10/03/2016 (Exact Date)   SpO2  100%  No data found.   Physical Exam  Constitutional: She is oriented to person, place, and time. She appears well-developed and well-nourished.  HENT:  Head: Normocephalic and atraumatic.  Right Ear: External ear normal.  Left Ear: External ear normal.  Mouth/Throat: Oropharynx is clear and moist.  Eyes: Conjunctivae and EOM are normal. Pupils are equal, round, and reactive to light.  Neck: Normal range of motion. Neck supple.  Cardiovascular: Normal rate, regular rhythm and normal heart sounds.   Pulmonary/Chest: Effort normal and breath sounds normal.  Neurological: She is alert and oriented to person, place, and time.  Nursing note and vitals reviewed.   Urgent Care  Course     Procedures (including critical care time)  Labs Review Labs Reviewed - No data to display  Imaging Review No results found.   Visual Acuity Review  Right Eye Distance:   Left Eye Distance:   Bilateral Distance:    Right Eye Near:   Left Eye Near:    Bilateral Near:         MDM   1. Tension headache   2. Viral URI    Toradol  IM Ipratropium Nasal spray  Push po fluids, rest, tylenol and motrin otc prn as directed for fever, arthralgias, and myalgias.  Follow up prn if sx's continue or persist.    Deatra Canter, FNP 11/13/16 1134

## 2016-12-26 ENCOUNTER — Encounter (HOSPITAL_COMMUNITY): Payer: Self-pay

## 2016-12-26 ENCOUNTER — Emergency Department (HOSPITAL_COMMUNITY)
Admission: EM | Admit: 2016-12-26 | Discharge: 2016-12-26 | Disposition: A | Discharge status: Home / Self Care | Payer: Medicaid Other | Attending: Emergency Medicine | Admitting: Emergency Medicine

## 2016-12-26 DIAGNOSIS — J069 Acute upper respiratory infection, unspecified: Secondary | ICD-10-CM

## 2016-12-26 DIAGNOSIS — F17210 Nicotine dependence, cigarettes, uncomplicated: Secondary | ICD-10-CM | POA: Insufficient documentation

## 2016-12-26 DIAGNOSIS — Z791 Long term (current) use of non-steroidal anti-inflammatories (NSAID): Secondary | ICD-10-CM | POA: Insufficient documentation

## 2016-12-26 DIAGNOSIS — Z79899 Other long term (current) drug therapy: Secondary | ICD-10-CM | POA: Diagnosis not present

## 2016-12-26 DIAGNOSIS — J111 Influenza due to unidentified influenza virus with other respiratory manifestations: Secondary | ICD-10-CM | POA: Diagnosis present

## 2016-12-26 MED ORDER — BENZONATATE 100 MG PO CAPS: 100.0000 mg | ORAL_CAPSULE | Freq: Three times a day (TID) | ORAL | 0 refills | Status: DC

## 2016-12-26 MED ORDER — GUAIFENESIN-DM 100-10 MG/5ML PO SYRP: 5.0000 mL | ORAL_SOLUTION | ORAL | 0 refills | Status: DC | PRN

## 2016-12-26 MED ORDER — PSEUDOEPHEDRINE HCL 30 MG PO TABS: 30.0000 mg | ORAL_TABLET | ORAL | 0 refills | Status: DC | PRN

## 2016-12-26 NOTE — ED Provider Notes (Signed)
[facility] Provider Note   CSN: [id number] Arrival date & time: 12/26/16  1720  By signing my name below, I, Rosana Fret, attest that this documentation has been prepared under the direction and in the presence of non-physician practitioner, Jaynie Crumble, PA-C. Electronically Signed: Rosana Fret, ED Scribe. 12/26/16. 6:59 PM.  History   Chief Complaint Chief Complaint  Patient presents with  . Influenza   The history is provided by the patient. No language interpreter was used.   HPI Comments: Tara [name] is a 31 y.o. female who presents to the Emergency Department complaining of a moderate, persistent productive cough onset 2 days ago. Pt reports associated rhinorrhea, headache, diarrhea and sore throat. No treatments tried prior to arrival in the ED. No hx of asthma. Pt denies any contact with any similar URI-like symptoms. Pt denies fever, chills, vomiting or any other complaints at this time.  Past Medical History:  Diagnosis Date  . Headache(784.0)   . Heart murmur    as baby  . History of chlamydia   . History of gonorrhea   . Preterm labor   . Urinary tract infection     Patient Active Problem List   Diagnosis Date Noted  . Fracture of left distal radius 01/14/2014  . Nausea and vomiting of pregnancy, antepartum 03/11/2011    Past Surgical History:  Procedure Laterality Date  . DILATION AND CURETTAGE OF UTERUS    . INDUCED ABORTION    . LAPAROSCOPIC TUBAL LIGATION  01/09/2012   Procedure: LAPAROSCOPIC TUBAL LIGATION;  Surgeon: Kathreen Cosier, MD;  Location: [facility];  Service: Gynecology;  Laterality: Bilateral;  . ORIF WRIST FRACTURE Left 01/15/2014   Procedure: OPEN REDUCTION INTERNAL FIXATION (ORIF) WRIST FRACTURE;  Surgeon: Sharma Covert, MD;  Location: [facility];  Service: Orthopedics;  Laterality: Left;    OB History    Gravida Para Term Preterm AB Living   4 2 2   2 2    SAB TAB Ectopic Multiple Live Births     2     2        Home Medications    Prior to Admission medications   Medication Sig Start Date End Date Taking? Authorizing Provider  diclofenac (VOLTAREN) 50 MG EC tablet Take 1 tablet (50 mg total) by mouth 2 (two) times daily. 09/30/16   Janne Napoleon, NP  ipratropium (ATROVENT) 0.06 % nasal spray Place 2 sprays into both nostrils 4 (four) times daily. 11/13/16   Deatra Canter, FNP  metroNIDAZOLE (FLAGYL) 500 MG tablet Take 1 tablet (500 mg total) by mouth 2 (two) times daily. 09/12/15   Tilden Fossa, MD    Family History Family History  Problem Relation Age of Onset  . Anesthesia problems Neg Hx   . Hearing loss Neg Hx     Social History Social History  Substance Use Topics  . Smoking status: Current Every Day Smoker    Packs/day: 0.25    Types: Cigarettes  . Smokeless tobacco: Never Used     Comment: quit with preg  . Alcohol use No     Allergies   Patient has no known allergies.   Review of Systems Review of Systems  Constitutional: Negative for chills and fever.  HENT: Positive for rhinorrhea and sore throat.   Respiratory: Positive for cough.   Gastrointestinal: Positive for diarrhea. Negative for vomiting.  Neurological: Positive for headaches.     Physical Exam Updated Vital Signs BP 104/69 (BP Location: Right Arm)  Pulse 87   Temp 98.8 F (37.1 C) (Oral)   Resp 16   Ht 5\' 3"  (1.6 m)   Wt 112 lb (50.8 kg)   LMP 12/20/2016   SpO2 100%   BMI 19.84 kg/m   Physical Exam  Constitutional: She is oriented to person, place, and time. She appears well-developed and well-nourished. No distress.  HENT:  Head: Normocephalic and atraumatic.  Right Ear: Tympanic membrane, external ear and ear canal normal.  Left Ear: Tympanic membrane, external ear and ear canal normal.  Nose: Mucosal edema and rhinorrhea present.  Mouth/Throat: Uvula is midline and mucous membranes are normal. Posterior oropharyngeal erythema present. No oropharyngeal exudate, posterior  oropharyngeal edema or tonsillar abscesses.  Eyes: Conjunctivae are normal.  Neck: Neck supple.  Cardiovascular: Normal rate, regular rhythm, normal heart sounds and intact distal pulses.   Pulmonary/Chest: Effort normal and breath sounds normal. No respiratory distress. She has no wheezes. She has no rales.  Abdominal: She exhibits no distension.  Musculoskeletal: Normal range of motion.  Neurological: She is alert and oriented to person, place, and time.  Skin: Skin is warm and dry.  Psychiatric: She has a normal mood and affect.  Nursing note and vitals reviewed.    ED Treatments / Results  DIAGNOSTIC STUDIES: Oxygen Saturation is 100% on RA, 100 by my interpretation.   COORDINATION OF CARE: 6:58 PM-Discussed next steps with pt including use of OTC cold medications and proper hydration. Pt verbalized understanding and is agreeable with the plan.   Labs (all labs ordered are listed, but only abnormal results are displayed) Labs Reviewed - No data to display  EKG  EKG Interpretation None       Radiology No results found.  Procedures Procedures (including critical care time)  Medications Ordered in ED Medications - No data to display   Initial Impression / Assessment and Plan / ED Course  I have reviewed the triage vital signs and the nursing notes.  Pertinent labs & imaging results that were available during my care of the patient were reviewed by me and considered in my medical decision making (see chart for details).     Pt with flu like symptoms. Normal vs here. Afebrile. Lungs clear. Exam unremarkable other than congestion. Pt has not taken anything prior to coming in. Consistent with viral URI. Will treat symptomatically with sidafed, tessalon, robitussin. Follow up with pcp.   Vitals:   12/26/16 1742 12/26/16 1923  BP: 104/69 126/75  Pulse: 87 79  Resp: 16 20  Temp: 98.8 F (37.1 C) 98.1 F (36.7 C)  TempSrc: Oral Oral  SpO2: 100% 99%  Weight:  50.8 kg (112 lb)   Height: 5\' 3"  (1.6 m)      Final Clinical Impressions(s) / ED Diagnoses   Final diagnoses:  Upper respiratory tract infection, unspecified type    New Prescriptions Discharge Medication List as of 12/26/2016  7:02 PM    START taking these medications   Details  benzonatate (TESSALON) 100 MG capsule Take 1 capsule (100 mg total) by mouth every 8 (eight) hours., Starting Thu 12/26/2016, Print    guaiFENesin-dextromethorphan (ROBITUSSIN DM) 100-10 MG/5ML syrup Take 5 mLs by mouth every 4 (four) hours as needed for cough., Starting Thu 12/26/2016, Print    pseudoephedrine (SUDAFED) 30 MG tablet Take 1 tablet (30 mg total) by mouth every 4 (four) hours as needed for congestion., Starting Thu 12/26/2016, Print       I personally performed the services described in this  documentation, which was scribed in my presence. The recorded information has been reviewed and is accurate.     Jaynie Crumble, PA-C 12/26/16 2026    Shaune Pollack, MD 12/27/16 [id number]

## 2016-12-26 NOTE — ED Triage Notes (Signed)
Pt endorses cold/flu sx x 2 days. Cough, runny nose, headache. Denies fever/chills. Afebrile in triage, vss.

## 2016-12-26 NOTE — Discharge Instructions (Signed)
Take robitussin DM and tessalon as prescribed as needed for cough. Sudafed for congestion. Drink plenty of fluids. Follow up with family doctor if not improving in 2-3 days.

## 2017-08-14 IMAGING — DX DG CHEST 2V
2 series · 2 of 2 positions shown · non-contrast
Comparison: None.

CLINICAL DATA: Palpitations.  Pain in lower left chest.

EXAM:
CHEST  2 VIEW

[chest pa]
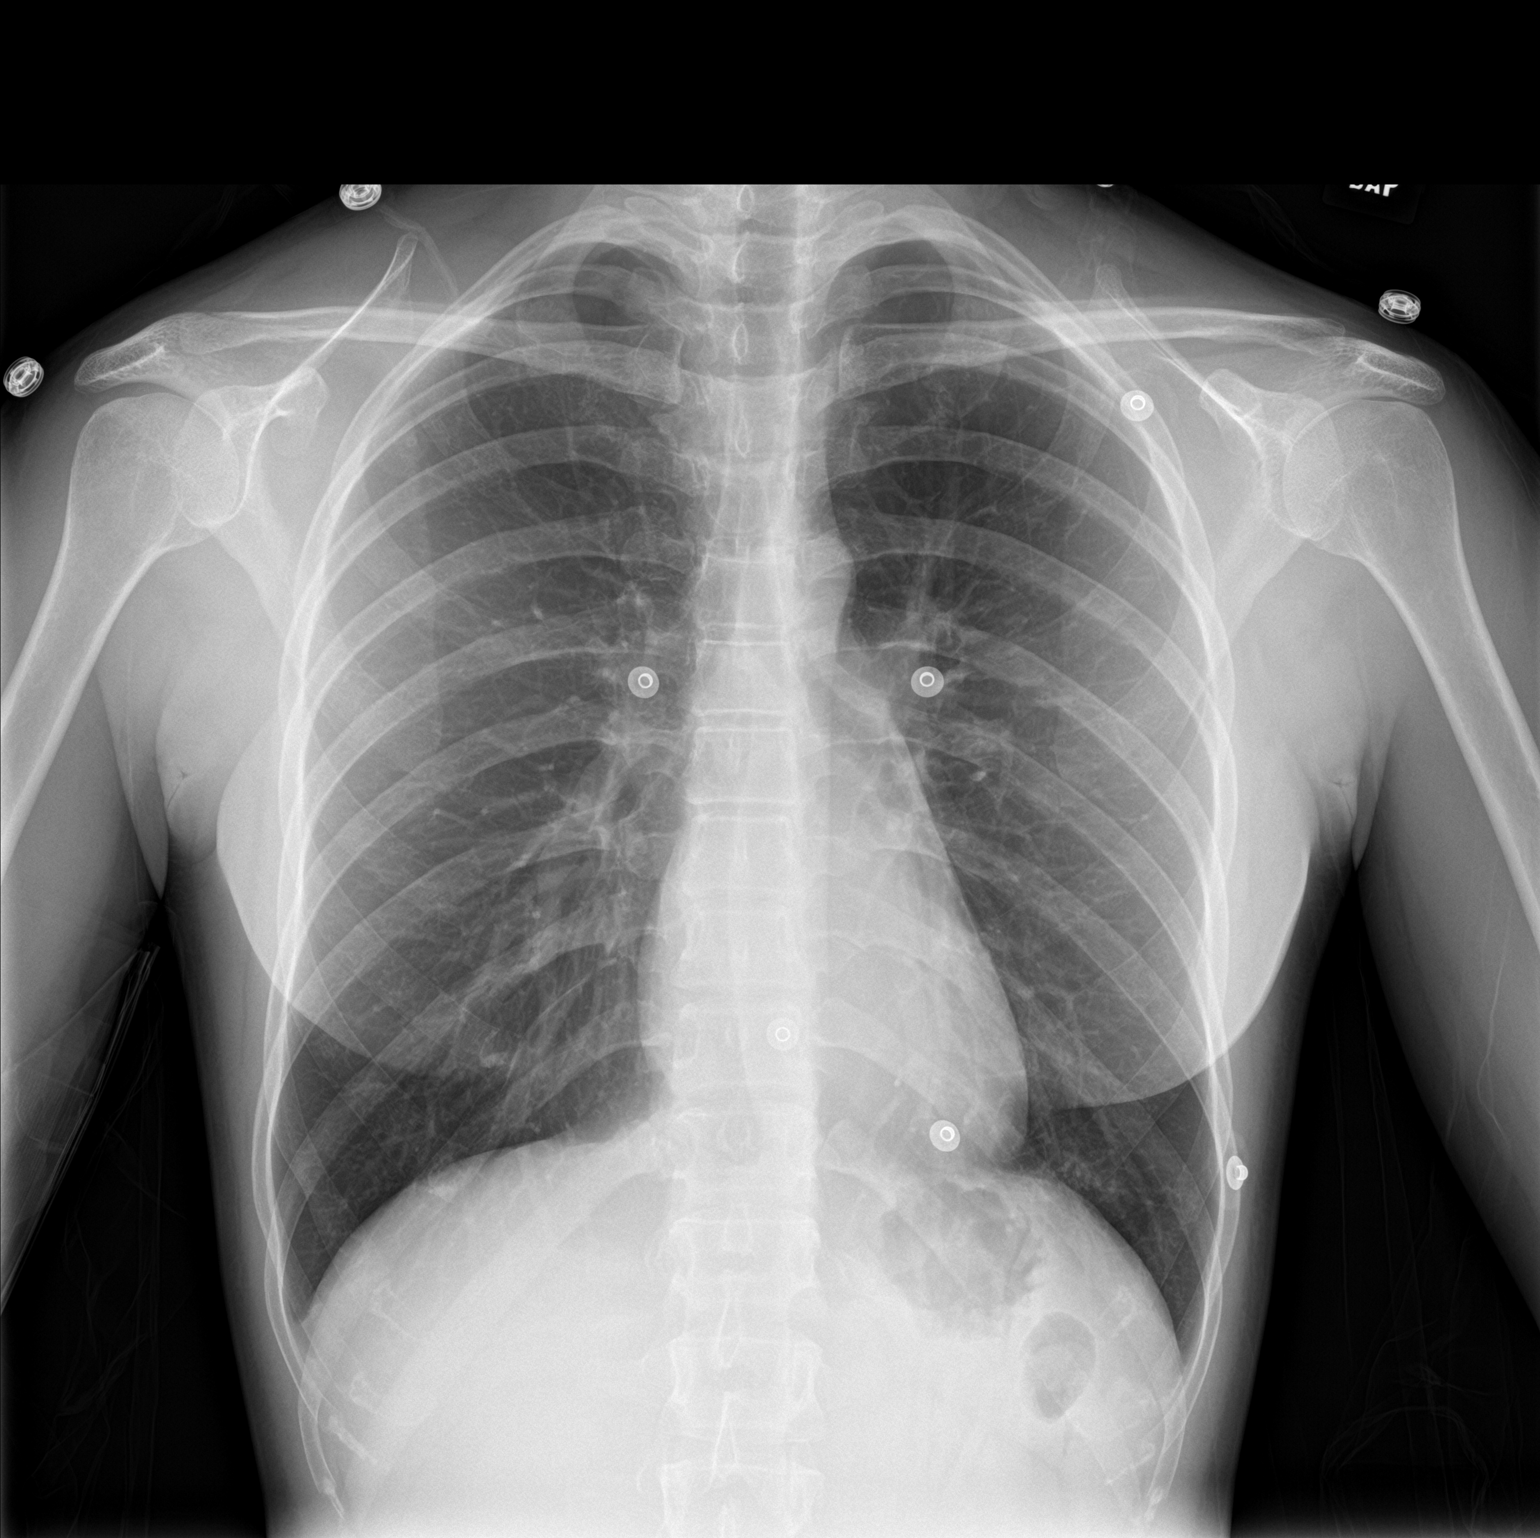

[chest lat]
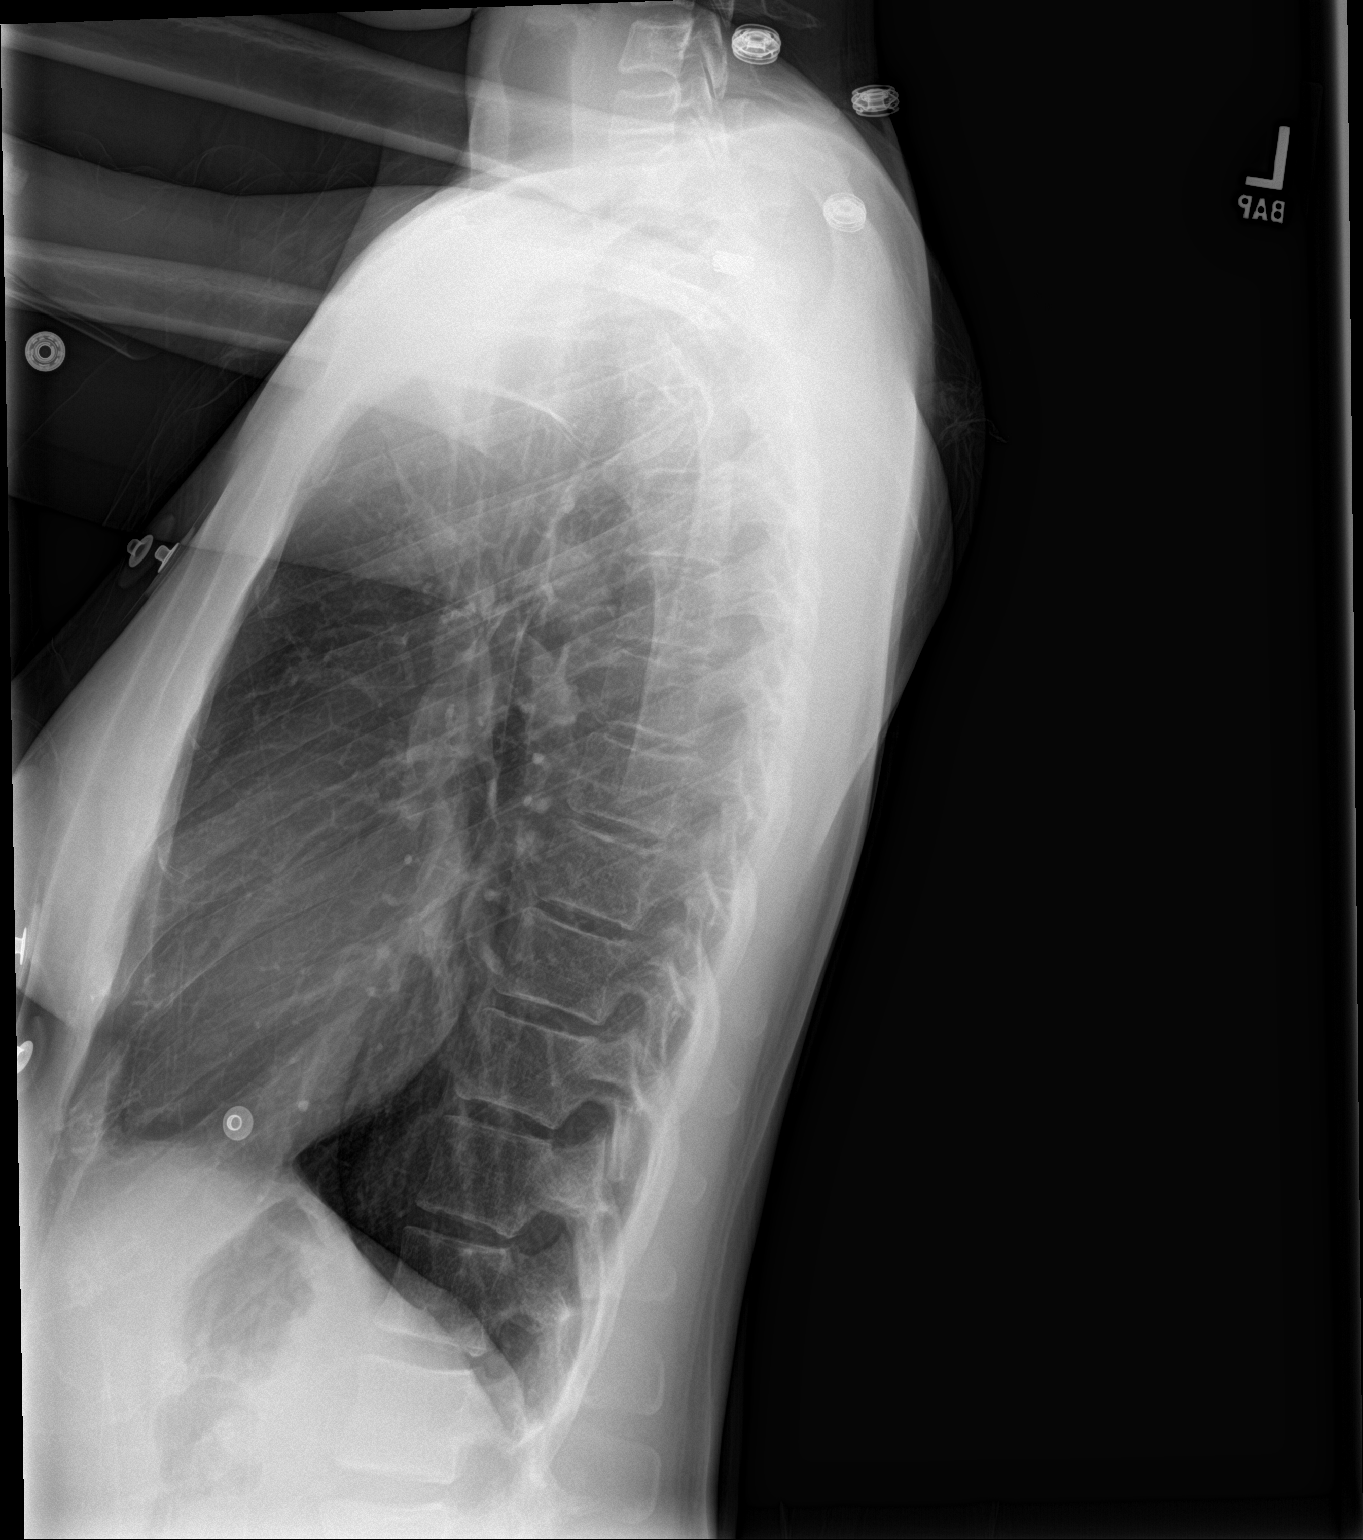

[2 of 2 positions shown; findings below may reference images not displayed]

FINDINGS: No pneumothorax. The heart, hila, and mediastinum are normal.
Hyperinflation of the lungs. No other acute abnormalities.
IMPRESSION: Hyperinflation. In a patient of this age, this could represent an
aggressive inspiratory effort. However, air trapping is not excluded
on this single study.

## 2018-04-14 ENCOUNTER — Encounter (HOSPITAL_COMMUNITY): Payer: Self-pay

## 2018-04-14 ENCOUNTER — Emergency Department (HOSPITAL_COMMUNITY)
Admission: EM | Admit: 2018-04-14 | Discharge: 2018-04-14 | Disposition: A | Discharge status: Home / Self Care | Payer: Medicaid Other | Attending: Emergency Medicine | Admitting: Emergency Medicine

## 2018-04-14 ENCOUNTER — Other Ambulatory Visit: Payer: Self-pay

## 2018-04-14 DIAGNOSIS — N63 Unspecified lump in unspecified breast: Secondary | ICD-10-CM | POA: Insufficient documentation

## 2018-04-14 DIAGNOSIS — F17210 Nicotine dependence, cigarettes, uncomplicated: Secondary | ICD-10-CM | POA: Insufficient documentation

## 2018-04-14 DIAGNOSIS — Z79899 Other long term (current) drug therapy: Secondary | ICD-10-CM | POA: Diagnosis not present

## 2018-04-14 NOTE — Discharge Instructions (Addendum)
Please return to the Emergency Department for any new or worsening symptoms or if your symptoms do not improve. Please be sure to follow up with your Primary Care Physician as soon as possible regarding your visit today. If you do not have a Primary Doctor please use the resources below to establish one. Please follow-up with the breast imaging center as soon as possible for follow-up regarding your breast lump. Sindy MessingRoger Gomez PA-C is the name of the primary care provider that you will be [facility].  There are no signs of infection/abscess at this time however return to the emergency department as soon as possible if you develop a fever, redness, streaking, skin changes, worsening pain, drainage or any other new or concerning symptoms.  See your doctor if you notice: A change in shape or size of your breasts or nipples. A change in the skin of your breast or nipples, such as red or scaly skin. Unusual fluid coming from your nipples. A lump or thick area that was not there before. Pain in your breasts. Anything that concerns you.  RESOURCE GUIDE  Chronic Pain Problems: Contact [facility]  [phone] Patients need to be referred by their primary care doctor.  Insufficient Money for Medicine: Contact United Way:  call "211" or [facility] [phone].  No Primary Care Doctor: Call Health Connect  [phone] - can help you locate a primary care doctor that  accepts your insurance, provides certain services, etc. Physician Referral Service- [phone]  Agencies that provide inexpensive medical care: [facility]  [phone] [facility]  [phone] [facility]  [phone] [facility]  [phone] Planned Parenthood  [phone] [facility]  [phone]  Medicaid-accepting [facility] Providers: [facility]- [address], [address]  [phone], Mon-Fri  9am-7pm, Sat 9am-1pm [facility]- [address], [address] [place]  [phone] [facility]- [address], [address] [place]  [phone] [facility]- [address]  [phone] Renaye RakersVeita Bland- [address], [address], [phone]  Only accepts [place] patients after they have their name  applied to their card  Self Pay (no insurance) in [facility]: Sickle Cell Patients: Dr Willey BladeEric Dean, [facility]  [address], [phone] [facility]- [address]  [phone]       Redge Gainer-      [facility] [place]- [address], [address]       -     [facility]- see information above (Speak to CitigroupPam H if you do not have insurance)       -  [facility]- [address], [phone]       -  [facility]- [address],  [phone]       -  [facility]- [address] [place] [address], [phone]       -  Dr Julio Sickssei-Bonsu-  [address], [address], [place], [phone]       -  [facility]- [address], [phone]       -  [facility]- [address], [phone], also [address], [phone]       -    [facility]- [address], [phone], 1st & 3rd Saturday   every month,  10am-1pm  1) Find a Doctor and Pay Out of Pocket Although you won't have to find out who is covered by your insurance plan, it is a good idea to ask around and get recommendations. You will then need to call the office and see if the doctor you have chosen will accept you as a new patient and what types of options they offer for patients who are self-pay. Some doctors offer discounts or will set up payment plans for their patients who do not have insurance, but you will need to ask so you aren't surprised when you get to your appointment.  2) Contact Your Local Health Department Not all health departments have doctors that can see patients for  sick visits, but many do, so it is worth a call to see if yours does. If you don't know where your local health department is, you can check in your phone book. The CDC also has a tool to help you locate your state's health department, and many state websites also have listings of all of their local health departments.  3) Find a Walk-in Clinic If your illness is not likely to be very severe or complicated, you may want to try a walk in clinic. These are popping up all over the country in pharmacies, drugstores, and shopping centers. They're usually staffed by nurse practitioners or physician assistants that have been trained to treat common illnesses and complaints. They're usually fairly quick and inexpensive. However, if you have serious medical issues or chronic medical problems, these are probably not your best option  STD Testing [facility] Department of [facility], STD Clinic, [address] [place], phone [phone] or [phone].  Monday - Friday, call for an appointment. [facility] Department of [employer], STD Clinic, [address], [place], phone [phone] or [phone].  Monday - Friday, call for an appointment.  Abuse/Neglect: [facility] Child Abuse Hotline 410-205-4329 [facility] Abuse Hotline 820-332-6651 (After Hours)  Emergency Shelter:  [facility] 630-359-9645  Maternity Homes: [facility] 260-183-1545 [facility] [phone]  MRSA Hotline #:   239-355-7800  [facility] Resources  [facility] [place]  United Way [facility] Dept. [address]                 [address]         [address] [place] [address]  Blondell Reveal Phone:  [phone]                                  Phone:  [phone]                   Phone:   [phone]  [facility], [phone] [phone]       -     [facility] in [place], [place]  [address],                                  [phone], Insurance  [place] Child Abuse Hotline 309-517-5913 or 707-866-6698 (After Hours)   Behavioral Health Services  Substance Abuse Resources: [facility]  [phone] [facility] [phone] [facility] [place] [phone] Floydene Flock 680-836-4512 Residential & Outpatient Substance Abuse Program  [phone]  Psychological Services: [facility]  [phone] [facility]  [phone] [facility], [phone] [place]. [address], [place], ACCESS LINE: [phone] or [phone], [employer]ur[employer]an.co.za  Dental Assistance  If unable to pay or uninsured, contact:  [facility] or [facility]. to become qualified for the adult dental clinic.  Patients with Medicaid: [facility] [phone] [address], [phone] [address], [phone]  If unable to pay, or uninsured, contact [facility] [phone]) or [facility] Department [phone] in [place], [phone] in [facility]) to become qualified for the adult dental clinic   Other Low-Cost Community Dental Services: [facility]- [address] [place], [place], [place], [address], [phone], Ext. [phone], 2nd and 4th Thursday of the month at 6:30am.  10 clients each day by appointment, can sometimes see walk-in patients if someone does not show for an appointment. [facility]- [address] [place] [place], [place], [address], [phone] [facility] [address], [place], [place], [address], [phone] [facility] Health Department- [phone] [facility] Health Department- [phone] [facility] [phone]

## 2018-04-14 NOTE — ED Triage Notes (Signed)
Pt states she has a knot in her left breast that she noticed this morning. 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 States the lump is sore to touch.

## 2018-04-14 NOTE — ED Notes (Signed)
Patient verbalizes understanding of discharge instructions. Opportunity for questioning and answers were provided. Pt discharged from ED ambulatory.  

## 2018-04-14 NOTE — Discharge Planning (Signed)
Zaedyn Covin J. Lucretia RoersWood, RN, BSN, [place] [phone]  [facility] up appointment with Sindy Messingoger Gomez, PA-C [facility] on 10/8 @ 10:30.  Spoke with pt at bedside and advised to please arrive 15 min early and take a picture ID and your current medications.  Pt verbalizes understanding of keeping appointment.

## 2018-04-14 NOTE — ED Provider Notes (Signed)
[facility] EMERGENCY DEPARTMENT Provider Note   CSN: [id number] Arrival date & time: 04/14/18  0848     History   Chief Complaint Chief Complaint  Patient presents with  . Breast Mass    HPI Tara [name] is a 32 y.o. female presenting for mass on left breast that patient first noticed this morning upon awakening.  Patient states that she has never noticed it before this morning.  Patient describes the area as a "knot "that is sore to the touch and mild in severity worsened with palpation.  Patient has not taken anything to relieve her pain.  Patient states that she came straight here after noticing the knot.  Patient denies recent history of trauma, fever or recent illness.  Patient denies skin changes or discharge.  Patient states that she has never had an area on her breast similar to this in the past.  Patient states that her last period began on 9/15 and ended on 9/20.  HPI  Past Medical History:  Diagnosis Date  . Headache(784.0)   . Heart murmur    as baby  . History of chlamydia   . History of gonorrhea   . Preterm labor   . Urinary tract infection     Patient Active Problem List   Diagnosis Date Noted  . Fracture of left distal radius 01/14/2014  . Nausea and vomiting of pregnancy, antepartum 03/11/2011    Past Surgical History:  Procedure Laterality Date  . DILATION AND CURETTAGE OF UTERUS    . INDUCED ABORTION    . LAPAROSCOPIC TUBAL LIGATION  01/09/2012   Procedure: LAPAROSCOPIC TUBAL LIGATION;  Surgeon: Kathreen Cosier, MD;  Location: [facility];  Service: Gynecology;  Laterality: Bilateral;  . ORIF WRIST FRACTURE Left 01/15/2014   Procedure: OPEN REDUCTION INTERNAL FIXATION (ORIF) WRIST FRACTURE;  Surgeon: Sharma Covert, MD;  Location: [facility] OR;  Service: Orthopedics;  Laterality: Left;     OB History    Gravida  4   Para  2   Term  2   Preterm      AB  2   Living  2     SAB      TAB  2   Ectopic      Multiple      Live Births  2            Home Medications    Prior to Admission medications   Medication Sig Start Date End Date Taking? Authorizing Provider  benzonatate (TESSALON) 100 MG capsule Take 1 capsule (100 mg total) by mouth every 8 (eight) hours. 12/26/16   Kirichenko, Lemont Fillers, PA-C  diclofenac (VOLTAREN) 50 MG EC tablet Take 1 tablet (50 mg total) by mouth 2 (two) times daily. 09/30/16   Janne Napoleon, NP  guaiFENesin-dextromethorphan (ROBITUSSIN DM) 100-10 MG/5ML syrup Take 5 mLs by mouth every 4 (four) hours as needed for cough. 12/26/16   Kirichenko, Tatyana, PA-C  ipratropium (ATROVENT) 0.06 % nasal spray Place 2 sprays into both nostrils 4 (four) times daily. 11/13/16   Deatra Canter, FNP  metroNIDAZOLE (FLAGYL) 500 MG tablet Take 1 tablet (500 mg total) by mouth 2 (two) times daily. 09/12/15   Tilden Fossa, MD  pseudoephedrine (SUDAFED) 30 MG tablet Take 1 tablet (30 mg total) by mouth every 4 (four) hours as needed for congestion. 12/26/16   Jaynie Crumble, PA-C    Family History Family History  Problem Relation Age of Onset  . Anesthesia problems Neg  Hx   . Hearing loss Neg Hx     Social History Social History   Tobacco Use  . Smoking status: Current Every Day Smoker    Packs/day: 0.25    Types: Cigarettes  . Smokeless tobacco: Never Used  . Tobacco comment: quit with preg  Substance Use Topics  . Alcohol use: No  . Drug use: No     Allergies   Patient has no known allergies.   Review of Systems Review of Systems  Constitutional: Negative.  Negative for chills, fatigue and fever.  Musculoskeletal: Negative.  Negative for arthralgias and myalgias.  Skin: Negative for color change, rash and wound.       Breast lump  Neurological: Negative.  Negative for weakness and numbness.     Physical Exam Updated Vital Signs BP 106/68 (BP Location: Right Arm)   Pulse 70   Temp 98.8 F (37.1 C) (Oral)   Resp 16   Ht 5\' 2"  (1.575 m)   Wt 50.8 kg   SpO2 100%    BMI 20.49 kg/m   Physical Exam  Constitutional: She appears well-developed and well-nourished. No distress.  HENT:  Head: Normocephalic and atraumatic.  Right Ear: External ear normal.  Left Ear: External ear normal.  Nose: Nose normal.  Eyes: Pupils are equal, round, and reactive to light. EOM are normal.  Neck: Trachea normal and normal range of motion. No tracheal deviation present.  Pulmonary/Chest: Effort normal. No respiratory distress. Right breast exhibits no inverted nipple, no mass, no nipple discharge, no skin change and no tenderness. Left breast exhibits mass and tenderness. Left breast exhibits no inverted nipple, no nipple discharge and no skin change.  Breast exam chaperoned by Chas[place] EMT.  Well-defined, firm, mobile, tender, 3 cm in diameter mass to 9 o'clock aspect of left breast. No overlying erythema, streaking or induration, no increased warmth.    Abdominal: Soft. There is no tenderness. There is no rebound and no guarding.  Musculoskeletal: Normal range of motion.  Neurological: She is alert. GCS eye subscore is 4. GCS verbal subscore is 5. GCS motor subscore is 6.  Speech is clear and goal oriented, follows commands Major Cranial nerves without deficit, no facial droop Moves extremities without ataxia, coordination intact  Skin: Skin is warm, dry and intact. No rash noted. No erythema.  Psychiatric: She has a normal mood and affect. Her behavior is normal.     ED Treatments / Results  Labs (all labs ordered are listed, but only abnormal results are displayed) Labs Reviewed - No data to display  EKG None  Radiology No results found.  Procedures Procedures (including critical care time)  Medications Ordered in ED Medications - No data to display   Initial Impression / Assessment and Plan / ED Course  I have reviewed the triage vital signs and the nursing notes.  Pertinent labs & imaging results that were available during my care of the  patient were reviewed by me and considered in my medical decision making (see chart for details).  Clinical Course as of Apr 14 949  Tue Apr 14, 2018  0919 Case management consulted to establish pcp   [BM]    Clinical Course User Index [BM] Bill Salinas, PA-C   32 year old patient presenting for concern of breast mass that was first noticed this morning.  3 cm, round, mobile, tender mass medial of left nipple without surrounding erythema, increased warmth or induration.  No discharge present. Patient has recently ended her period.  Patient is afebrile, not tachycardic, not tachypneic, well-appearing and in no acute distress, doubt infectious etiology of pain, suspect breast cyst vs fibroadenoma today.  No indications for antibiotic use at this time.   Patient has been given referral to breast imaging center for further evaluation and treatment of her breast lump.  Patient did not have a primary care provider upon arrival, case management consulted and patient has been given follow-up with a primary care provider so that way she can follow-up with the breast center as soon as possible.  Plan of care explained to patient and she is agreeable.  At this time there does not appear to be any evidence of an acute emergency medical condition and the patient appears stable for discharge with appropriate outpatient follow up. Diagnosis was discussed with patient who verbalizes understanding of care plan and is agreeable to discharge. I have discussed return precautions with patient and family at bedside who verbalize understanding of return precautions. Patient strongly encouraged to follow-up with their PCP. All questions answered.   Note: Portions of this report may have been transcribed using voice recognition software. Every effort was made to ensure accuracy; however, inadvertent computerized transcription errors may still be present.  Final Clinical Impressions(s) / ED Diagnoses   Final  diagnoses:  Breast lump in female    ED Discharge Orders    None       Elizabeth PalauMorelli, Kadajah Kjos A, PA-C 04/14/18 1539    Mesner, Barbara CowerJason, MD 04/14/18 1622

## 2018-04-16 ENCOUNTER — Telehealth (INDEPENDENT_AMBULATORY_CARE_PROVIDER_SITE_OTHER): Payer: Self-pay | Admitting: Physician Assistant

## 2018-04-16 NOTE — Telephone Encounter (Signed)
Patient called requesting a referral for the [facility]. Patient states that the ED told her she need to set up an appointment with them. Patient called and was told she needed a referral from her PCP. Patient  has an appointment on Oct 8 @ 10:30 for a HFU. Front desk told patient that she would send a message but would not guarantee that the referral will be placed since she has never been seen by PCP and would most likely need to wait until her appointment date.  Please Advice [phone]  Thank you Tara [name]

## 2018-04-16 NOTE — Telephone Encounter (Signed)
FWD to PCP. Tempestt S Roberts, CMA  

## 2018-04-16 NOTE — Telephone Encounter (Signed)
Pt needs to establish care here and be seen before a referral can be made. All urgent inquiries/requests by non-established patients can be seen at urgent care.

## 2018-04-28 ENCOUNTER — Encounter (INDEPENDENT_AMBULATORY_CARE_PROVIDER_SITE_OTHER): Payer: Self-pay | Admitting: Physician Assistant

## 2018-04-28 ENCOUNTER — Other Ambulatory Visit: Payer: Self-pay

## 2018-04-28 ENCOUNTER — Ambulatory Visit (INDEPENDENT_AMBULATORY_CARE_PROVIDER_SITE_OTHER): Payer: Medicaid Other | Admitting: Physician Assistant

## 2018-04-28 ENCOUNTER — Other Ambulatory Visit (INDEPENDENT_AMBULATORY_CARE_PROVIDER_SITE_OTHER): Payer: Self-pay | Admitting: Physician Assistant

## 2018-04-28 VITALS — BP 97/62 | HR 91 | Temp 98.4°F | Ht 62.5 in | Wt 108.6 lb

## 2018-04-28 DIAGNOSIS — R002 Palpitations: Secondary | ICD-10-CM

## 2018-04-28 DIAGNOSIS — N6322 Unspecified lump in the left breast, upper inner quadrant: Secondary | ICD-10-CM | POA: Diagnosis not present

## 2018-04-28 DIAGNOSIS — R61 Generalized hyperhidrosis: Secondary | ICD-10-CM

## 2018-04-28 DIAGNOSIS — N63 Unspecified lump in unspecified breast: Secondary | ICD-10-CM

## 2018-04-28 DIAGNOSIS — N920 Excessive and frequent menstruation with regular cycle: Secondary | ICD-10-CM | POA: Diagnosis not present

## 2018-04-28 MED ORDER — CARVEDILOL 6.25 MG PO TABS: 6.2500 mg | ORAL_TABLET | Freq: Two times a day (BID) | ORAL | 3 refills | Status: DC

## 2018-04-28 NOTE — Patient Instructions (Signed)
Palpitations A palpitation is the feeling that your heart:  Has an uneven (irregular) heartbeat.  Is beating faster than normal.  Is fluttering.  Is skipping a beat.  This is usually not a serious problem. In some cases, you may need more medical tests. Follow these instructions at home:  Avoid: ? Caffeine in coffee, tea, soft drinks, diet pills, and energy drinks. ? Chocolate. ? Alcohol.  Do not use any tobacco products. These include cigarettes, chewing tobacco, and e-cigarettes. If you need help quitting, ask your doctor.  Try to reduce your stress. These things may help: ? Yoga. ? Meditation. ? Physical activity. Swimming, jogging, and walking are good choices. ? A method that helps you use your mind to control things in your body, like heartbeats (biofeedback).  Get plenty of rest and sleep.  Take over-the-counter and prescription medicines only as told by your doctor.  Keep all follow-up visits as told by your doctor. This is important. Contact a doctor if:  Your heartbeat is still fast or uneven after 24 hours.  Your palpitations occur more often. Get help right away if:  You have chest pain.  You feel short of breath.  You have a very bad headache.  You feel dizzy.  You pass out (faint). This information is not intended to replace advice given to you by your health care provider. Make sure you discuss any questions you have with your health care provider. Document Released: 04/16/2008 Document Revised: 12/14/2015 Document Reviewed: 03/23/2015 Elsevier Interactive Patient Education  2018 [employer]vier Inc.  

## 2018-04-28 NOTE — Progress Notes (Signed)
Subjective:  Patient ID: Tara [name], female    DOB: 01-07-1986  Age: 32 y.o. MRN: [id number]  CC: lump on left breast  HPI Tara [name] is a 32 y.o. female with a medical history of chlamydia, gonorrhea, headaches, plantar fasciitis, palpitations, GERD, and BTL presents as a new patient with complaint of lump on left breast. First noticed on 04/14/18 and went to ED. ED provider suspected breast cyst vs fibroadenoma. Discharged to PCP. Pt continues to have left breast nodule and mild tenderness to palpation. Concerned due to family history of breast cancer. Does not endorseerythema, injury, induration, or nipple discharge, chest wall pain, or enlarged axillary lymph nodes.      Pt also complains of palpitations, night sweats, menorrhagia, and fatigue. Does not endorse a history of thyroid disease nor does she endorse any other symptoms or complaints. Denies anxiety. GAD7 score 3 today.     Outpatient Medications Prior to Visit  Medication Sig Dispense Refill  . benzonatate (TESSALON) 100 MG capsule Take 1 capsule (100 mg total) by mouth every 8 (eight) hours. 21 capsule 0  . diclofenac (VOLTAREN) 50 MG EC tablet Take 1 tablet (50 mg total) by mouth 2 (two) times daily. 15 tablet 0  . guaiFENesin-dextromethorphan (ROBITUSSIN DM) 100-10 MG/5ML syrup Take 5 mLs by mouth every 4 (four) hours as needed for cough. 118 mL 0  . ipratropium (ATROVENT) 0.06 % nasal spray Place 2 sprays into both nostrils 4 (four) times daily. 15 mL 0  . metroNIDAZOLE (FLAGYL) 500 MG tablet Take 1 tablet (500 mg total) by mouth 2 (two) times daily. 14 tablet 0  . pseudoephedrine (SUDAFED) 30 MG tablet Take 1 tablet (30 mg total) by mouth every 4 (four) hours as needed for congestion. 30 tablet 0   No facility-administered medications prior to visit.      ROS Review of Systems  Constitutional: Negative for chills, fever and malaise/fatigue.  Eyes: Negative for blurred vision.  Respiratory: Negative for  shortness of breath.   Cardiovascular: Positive for palpitations. Negative for chest pain.  Gastrointestinal: Negative for abdominal pain and nausea.  Genitourinary: Negative for dysuria and hematuria.       Heavy menstrual bleeding  Musculoskeletal: Negative for joint pain and myalgias.  Skin: Negative for rash.  Neurological: Negative for tingling and headaches.  Psychiatric/Behavioral: Negative for depression. The patient is not nervous/anxious.     Objective:  Ht 5' 2.5" (1.588 m)   Wt 108 lb 9.6 oz (49.3 kg)   LMP 04/05/2018 (Exact Date)   BMI 19.55 kg/m       Physical Exam  Constitutional: She is oriented to person, place, and time.  Well developed, thin, NAD, polite  HENT:  Head: Normocephalic and atraumatic.  Eyes: No scleral icterus.  Neck: Normal range of motion. Neck supple. No thyromegaly present.  Cardiovascular: Normal rate, regular rhythm and normal heart sounds.  Pulmonary/Chest: Effort normal and breath sounds normal. Right breast exhibits no inverted nipple, no mass, no nipple discharge, no skin change and no tenderness. Left breast exhibits tenderness. Left breast exhibits no inverted nipple, no mass, no nipple discharge and no skin change. No breast swelling, discharge or bleeding. Breasts are symmetrical.    Abdominal: Soft. Bowel sounds are normal. There is no tenderness.  Musculoskeletal: She exhibits no edema.  Neurological: She is alert and oriented to person, place, and time.  Skin: Skin is warm and dry. No rash noted. No erythema. No pallor.  Psychiatric: She has a normal mood  and affect. Her behavior is normal. Thought content normal.  Vitals reviewed.    Assessment & Plan:    1. Breast lump - Diagnostic mammogram   2. Palpitations - Thyroid Panel With TSH - Comprehensive metabolic panel - CBC with Differential - EKG 12-Lead NSR today.  3. Night sweats - Thyroid Panel With TSH - Comprehensive metabolic panel - CBC with  Differential  4. Menorrhagia with regular cycle - Thyroid Panel With TSH - CBC with Differential   Meds ordered this encounter  Medications  . carvedilol (COREG) 6.25 MG tablet    Sig: Take 1 tablet (6.25 mg total) by mouth 2 (two) times daily with a meal.    Dispense:  60 tablet    Refill:  3    Order Specific Question:   Supervising Provider    Answer:   Hoy Register [[id number]]    Follow-up: Return in about 4 weeks (around 05/26/2018) for palpitations.   Loletta Specter PA

## 2018-04-29 ENCOUNTER — Telehealth (INDEPENDENT_AMBULATORY_CARE_PROVIDER_SITE_OTHER): Payer: Self-pay

## 2018-04-29 LAB — CBC WITH DIFFERENTIAL/PLATELET
BASOS: 1 %
Basophils Absolute: 0 10*3/uL (ref 0.0–0.2)
EOS (ABSOLUTE): 0.1 10*3/uL (ref 0.0–0.4)
EOS: 1 %
HEMATOCRIT: 38 % (ref 34.0–46.6)
HEMOGLOBIN: 12.4 g/dL (ref 11.1–15.9)
Immature Grans (Abs): 0 10*3/uL (ref 0.0–0.1)
Immature Granulocytes: 0 %
LYMPHS ABS: 1.9 10*3/uL (ref 0.7–3.1)
Lymphs: 45 %
MCH: 31.2 pg (ref 26.6–33.0)
MCHC: 32.6 g/dL (ref 31.5–35.7)
MCV: 96 fL (ref 79–97)
MONOCYTES: 7 %
MONOS ABS: 0.3 10*3/uL (ref 0.1–0.9)
NEUTROS ABS: 2 10*3/uL (ref 1.4–7.0)
Neutrophils: 46 %
Platelets: 303 10*3/uL (ref 150–450)
RBC: 3.98 x10E6/uL (ref 3.77–5.28)
RDW: 11.9 % — AB (ref 12.3–15.4)
WBC: 4.3 10*3/uL (ref 3.4–10.8)

## 2018-04-29 LAB — COMPREHENSIVE METABOLIC PANEL
ALBUMIN: 4.5 g/dL (ref 3.5–5.5)
ALK PHOS: 47 IU/L (ref 39–117)
ALT: 4 IU/L (ref 0–32)
AST: 8 IU/L (ref 0–40)
Albumin/Globulin Ratio: 2 (ref 1.2–2.2)
BUN / CREAT RATIO: 30 — AB (ref 9–23)
BUN: 18 mg/dL (ref 6–20)
Bilirubin Total: 0.5 mg/dL (ref 0.0–1.2)
CALCIUM: 9.2 mg/dL (ref 8.7–10.2)
CO2: 20 mmol/L (ref 20–29)
CREATININE: 0.6 mg/dL (ref 0.57–1.00)
Chloride: 103 mmol/L (ref 96–106)
GFR, EST AFRICAN AMERICAN: 140 mL/min/{1.73_m2} (ref >59)
GFR, EST NON AFRICAN AMERICAN: 122 mL/min/{1.73_m2} (ref >59)
GLOBULIN, TOTAL: 2.3 g/dL (ref 1.5–4.5)
GLUCOSE: 78 mg/dL (ref 65–99)
Potassium: 4.1 mmol/L (ref 3.5–5.2)
SODIUM: 139 mmol/L (ref 134–144)
TOTAL PROTEIN: 6.8 g/dL (ref 6.0–8.5)

## 2018-04-29 LAB — THYROID PANEL WITH TSH
Free Thyroxine Index: 1.6 (ref 1.2–4.9)
T3 Uptake Ratio: 24 % (ref 24–39)
T4 TOTAL: 6.6 ug/dL (ref 4.5–12.0)
TSH: 1.47 u[IU]/mL (ref 0.450–4.500)

## 2018-04-29 NOTE — Telephone Encounter (Signed)
-----   Message from Roger David Gomez, PA-C sent at 04/29/2018  8:40 AM EDT ----- No anemia, no sign of infection/inflammation, normal kidney, normal liver, normal thyroid. 

## 2018-04-29 NOTE — Telephone Encounter (Signed)
Patient voicemail is full, unable to leave message. Will try calling again. Maryjean Morn, CMA

## 2018-04-30 ENCOUNTER — Encounter (INDEPENDENT_AMBULATORY_CARE_PROVIDER_SITE_OTHER): Payer: Self-pay

## 2018-04-30 ENCOUNTER — Telehealth (INDEPENDENT_AMBULATORY_CARE_PROVIDER_SITE_OTHER): Payer: Self-pay

## 2018-04-30 NOTE — Telephone Encounter (Signed)
Patient voicemail is full, unable to leave message. Results mailed. Tara [name], CMA

## 2018-04-30 NOTE — Telephone Encounter (Signed)
-----   Message from Loletta Specter, PA-C sent at 04/29/2018  8:40 AM EDT ----- No anemia, no sign of infection/inflammation, normal kidney, normal liver, normal thyroid.

## 2018-05-06 ENCOUNTER — Other Ambulatory Visit (INDEPENDENT_AMBULATORY_CARE_PROVIDER_SITE_OTHER): Payer: Self-pay | Admitting: Physician Assistant

## 2018-05-08 ENCOUNTER — Other Ambulatory Visit (INDEPENDENT_AMBULATORY_CARE_PROVIDER_SITE_OTHER): Payer: Self-pay | Admitting: Physician Assistant

## 2018-05-08 ENCOUNTER — Ambulatory Visit
Admission: RE | Admit: 2018-05-08 | Discharge: 2018-05-08 | Disposition: A | Discharge status: Home / Self Care | Payer: Medicaid Other | Source: Ambulatory Visit | Attending: Physician Assistant | Admitting: Physician Assistant

## 2018-05-08 DIAGNOSIS — N63 Unspecified lump in unspecified breast: Secondary | ICD-10-CM

## 2018-10-12 ENCOUNTER — Emergency Department (HOSPITAL_COMMUNITY): Payer: Medicaid Other

## 2018-10-12 ENCOUNTER — Other Ambulatory Visit: Payer: Self-pay

## 2018-10-12 ENCOUNTER — Emergency Department (HOSPITAL_COMMUNITY)
Admission: EM | Admit: 2018-10-12 | Discharge: 2018-10-13 | Disposition: A | Discharge status: Home / Self Care | Payer: Medicaid Other | Attending: Emergency Medicine | Admitting: Emergency Medicine

## 2018-10-12 DIAGNOSIS — Z79899 Other long term (current) drug therapy: Secondary | ICD-10-CM | POA: Insufficient documentation

## 2018-10-12 DIAGNOSIS — S6992XA Unspecified injury of left wrist, hand and finger(s), initial encounter: Secondary | ICD-10-CM | POA: Diagnosis present

## 2018-10-12 DIAGNOSIS — Y929 Unspecified place or not applicable: Secondary | ICD-10-CM | POA: Diagnosis not present

## 2018-10-12 DIAGNOSIS — Y939 Activity, unspecified: Secondary | ICD-10-CM | POA: Diagnosis not present

## 2018-10-12 DIAGNOSIS — Y999 Unspecified external cause status: Secondary | ICD-10-CM | POA: Insufficient documentation

## 2018-10-12 DIAGNOSIS — S93509A Unspecified sprain of unspecified toe(s), initial encounter: Secondary | ICD-10-CM

## 2018-10-12 DIAGNOSIS — S6990XA Unspecified injury of unspecified wrist, hand and finger(s), initial encounter: Secondary | ICD-10-CM

## 2018-10-12 DIAGNOSIS — S93501A Unspecified sprain of right great toe, initial encounter: Secondary | ICD-10-CM | POA: Insufficient documentation

## 2018-10-12 DIAGNOSIS — F17210 Nicotine dependence, cigarettes, uncomplicated: Secondary | ICD-10-CM | POA: Diagnosis not present

## 2018-10-12 DIAGNOSIS — S60941A Unspecified superficial injury of left index finger, initial encounter: Secondary | ICD-10-CM | POA: Diagnosis not present
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
## 2018-10-12 NOTE — Discharge Instructions (Addendum)
Take your false nails off.  Wear the post-op shoe as needed.  Follow-up with your doctor.  Take Tylenol or Motrin for pain.

## 2018-10-22 ENCOUNTER — Other Ambulatory Visit: Payer: Self-pay | Admitting: Physician Assistant

## 2018-11-09 ENCOUNTER — Other Ambulatory Visit: Payer: Medicaid Other

## 2019-04-03 ENCOUNTER — Encounter (HOSPITAL_COMMUNITY): Payer: Self-pay | Admitting: *Deleted

## 2019-04-03 ENCOUNTER — Emergency Department (HOSPITAL_COMMUNITY): Payer: Medicaid Other

## 2019-04-03 ENCOUNTER — Other Ambulatory Visit: Payer: Self-pay

## 2019-04-03 ENCOUNTER — Emergency Department (HOSPITAL_COMMUNITY)
Admission: EM | Admit: 2019-04-03 | Discharge: 2019-04-03 | Disposition: A | Discharge status: Home / Self Care | Payer: Medicaid Other | Attending: Emergency Medicine | Admitting: Emergency Medicine

## 2019-04-03 DIAGNOSIS — M25561 Pain in right knee: Secondary | ICD-10-CM

## 2019-04-03 DIAGNOSIS — Z79899 Other long term (current) drug therapy: Secondary | ICD-10-CM | POA: Diagnosis not present

## 2019-04-03 DIAGNOSIS — F17210 Nicotine dependence, cigarettes, uncomplicated: Secondary | ICD-10-CM | POA: Insufficient documentation

## 2019-04-03 MED ORDER — NAPROXEN 500 MG PO TABS
500.0000 mg | ORAL_TABLET | Freq: Once | ORAL | Status: AC
  Administered 2019-04-03: 500 mg via ORAL
  Filled 2019-04-03: qty 1

## 2019-04-03 NOTE — ED Provider Notes (Signed)
[facility] DEPT Provider Note   CSN: [id number] Arrival date & time: 04/03/19  0105     History   Chief Complaint Chief Complaint  Patient presents with  . Knee Pain    HPI Tara [name] is a 33 y.o. female.     The history is provided by the patient. No language interpreter was used.  Knee Pain Location:  Knee Injury: no   Knee location:  R knee Pain details:    Quality:  Burning and aching   Radiates to:  Does not radiate   Severity:  Moderate   Onset quality:  Gradual   Timing:  Constant   Progression:  Unchanged Chronicity:  New Dislocation: no   Prior injury to area:  No Relieved by:  Nothing Worsened by:  Bearing weight Ineffective treatments:  None tried Associated symptoms: no decreased ROM, no fever, no numbness, no swelling and no tingling   Risk factors: no obesity     Past Medical History:  Diagnosis Date  . Headache(784.0)   . Heart murmur    as baby  . History of chlamydia   . History of gonorrhea   . Preterm labor   . Urinary tract infection     Patient Active Problem List   Diagnosis Date Noted  . Fracture of left distal radius 01/14/2014  . Nausea and vomiting of pregnancy, antepartum 03/11/2011    Past Surgical History:  Procedure Laterality Date  . DILATION AND CURETTAGE OF UTERUS    . INDUCED ABORTION    . LAPAROSCOPIC TUBAL LIGATION  01/09/2012   Procedure: LAPAROSCOPIC TUBAL LIGATION;  Surgeon: Frederico Hamman, MD;  Location: [facility];  Service: Gynecology;  Laterality: Bilateral;  . ORIF WRIST FRACTURE Left 01/15/2014   Procedure: OPEN REDUCTION INTERNAL FIXATION (ORIF) WRIST FRACTURE;  Surgeon: Linna Hoff, MD;  Location: [facility];  Service: Orthopedics;  Laterality: Left;  . TUBAL LIGATION       OB History    Gravida  4   Para  2   Term  2   Preterm      AB  2   Living  2     SAB      TAB  2   Ectopic      Multiple      Live Births  2            Home Medications     Prior to Admission medications   Medication Sig Start Date End Date Taking? Authorizing Provider  carvedilol (COREG) 6.25 MG tablet Take 1 tablet (6.25 mg total) by mouth 2 (two) times daily with a meal. 04/28/18   Clent Demark, PA-C    Family History Family History  Problem Relation Age of Onset  . Breast cancer Paternal Aunt        in 20's or 55's  . Anesthesia problems Neg Hx   . Hearing loss Neg Hx     Social History Social History   Tobacco Use  . Smoking status: Current Every Day Smoker    Packs/day: 0.25    Types: Cigarettes  . Smokeless tobacco: Never Used  Substance Use Topics  . Alcohol use: No  . Drug use: No     Allergies   Patient has no known allergies.   Review of Systems Review of Systems  Constitutional: Negative for fever.  Musculoskeletal: Positive for arthralgias.  Ten systems reviewed and are negative for acute change, except as noted in the  HPI.    Physical Exam Updated Vital Signs BP (!) 102/56   Pulse 76   Temp 98.4 F (36.9 C) (Oral)   Resp 19   LMP 03/04/2019   SpO2 100%   Physical Exam Vitals signs and nursing note reviewed.  Constitutional:      General: She is not in acute distress.    Appearance: She is well-developed. She is not diaphoretic.     Comments: Nontoxic appearing and in NAD  HENT:     Head: Normocephalic and atraumatic.  Eyes:     General: No scleral icterus.    Conjunctiva/sclera: Conjunctivae normal.  Neck:     Musculoskeletal: Normal range of motion.  Cardiovascular:     Rate and Rhythm: Normal rate and regular rhythm.     Pulses: Normal pulses.     Comments: DP pulse 2+ in the RLE Pulmonary:     Effort: Pulmonary effort is normal. No respiratory distress.     Comments: Respirations even and unlabored Musculoskeletal: Normal range of motion.     Comments: Preserved active and passive range of motion of the right knee.  There is tenderness along the medial joint line of the right knee.  No bony  deformity or crepitus.  No swelling, erythema, heat to touch.  Skin:    General: Skin is warm and dry.     Coloration: Skin is not pale.     Findings: No erythema or rash.  Neurological:     General: No focal deficit present.     Mental Status: She is alert and oriented to person, place, and time.     Coordination: Coordination normal.     Comments: Sensation to light touch intact in the RLE  Psychiatric:        Behavior: Behavior normal.      ED Treatments / Results  Labs (all labs ordered are listed, but only abnormal results are displayed) Labs Reviewed - No data to display  EKG None  Radiology Dg Knee Complete 4 Views Right  Result Date: 04/03/2019 CLINICAL DATA:  Right knee pain onset today. EXAM: RIGHT KNEE - COMPLETE 4+ VIEW COMPARISON:  None. FINDINGS: No evidence of fracture, dislocation, or joint effusion. No evidence of arthropathy or other focal bone abnormality. Soft tissues are unremarkable. IMPRESSION: Negative radiographs of the right knee. Electronically Signed   By: Narda RutherfordMelanie  Sanford M.D.   On: 04/03/2019 01:50    Procedures Procedures (including critical care time)  Medications Ordered in ED Medications  naproxen (NAPROSYN) tablet 500 mg (has no administration in time range)     Initial Impression / Assessment and Plan / ED Course  I have reviewed the triage vital signs and the nursing notes.  Pertinent labs & imaging results that were available during my care of the patient were reviewed by me and considered in my medical decision making (see chart for details).        Patient presents to the emergency department for evaluation of R knee pain. Patient neurovascularly intact on exam. Imaging negative for fracture, dislocation, bony deformity. No swelling, erythema, heat to touch to the affected area; no concern for septic joint. Compartments in the affected extremity are soft. Plan for supportive management including RICE and NSAIDs; primary care  follow up as needed. Return precautions discussed and provided. Patient discharged in stable condition with no unaddressed concerns.   Final Clinical Impressions(s) / ED Diagnoses   Final diagnoses:  Medial knee pain, right    ED Discharge Orders  None       Antony Madura, PA-C 04/03/19 0414    Nira Conn, MD 04/04/19 [phone]

## 2019-04-03 NOTE — Discharge Instructions (Signed)
Take ibuprofen or Aleve for the next week for management of pain.  Apply ice to your knee 3-4 times per day for 15 to 20 minutes each time to help limit inflammation.  You may find additional stability with use of a knee sleeve.  This was provided to you prior to discharge.  Try to rest and elevate your leg as much as possible.  Follow-up with your doctor in 1 week for recheck.  You may return to the ED for any new or concerning symptoms.

## 2019-04-03 NOTE — ED Triage Notes (Addendum)
Pt with right knee pain that started today. Pain goes from the medial knee across the front to the lateral side, feels swollen.

## 2019-04-03 NOTE — ED Notes (Signed)
Patient states knee pain is sharp and began today while she was running errands. Patient is ambulatory with steady gait on knee.

## 2019-06-29 ENCOUNTER — Emergency Department (HOSPITAL_COMMUNITY)
Admission: EM | Admit: 2019-06-29 | Discharge: 2019-06-29 | Disposition: A | Discharge status: Home / Self Care | Payer: Medicaid Other | Attending: Emergency Medicine | Admitting: Emergency Medicine

## 2019-06-29 ENCOUNTER — Encounter (HOSPITAL_COMMUNITY): Payer: Self-pay | Admitting: Emergency Medicine

## 2019-06-29 DIAGNOSIS — Z79899 Other long term (current) drug therapy: Secondary | ICD-10-CM | POA: Diagnosis not present

## 2019-06-29 DIAGNOSIS — R110 Nausea: Secondary | ICD-10-CM | POA: Diagnosis not present

## 2019-06-29 DIAGNOSIS — F17210 Nicotine dependence, cigarettes, uncomplicated: Secondary | ICD-10-CM | POA: Insufficient documentation

## 2019-06-29 DIAGNOSIS — Z20822 Contact with and (suspected) exposure to covid-19: Secondary | ICD-10-CM

## 2019-06-29 DIAGNOSIS — R197 Diarrhea, unspecified: Secondary | ICD-10-CM | POA: Insufficient documentation

## 2019-06-29 DIAGNOSIS — R519 Headache, unspecified: Secondary | ICD-10-CM | POA: Insufficient documentation

## 2019-06-29 DIAGNOSIS — Z20828 Contact with and (suspected) exposure to other viral communicable diseases: Secondary | ICD-10-CM | POA: Diagnosis not present

## 2019-06-29 LAB — URINALYSIS, ROUTINE W REFLEX MICROSCOPIC
Bilirubin Urine: NEGATIVE
Glucose, UA: NEGATIVE mg/dL
Ketones, ur: NEGATIVE mg/dL
Leukocytes,Ua: NEGATIVE
Nitrite: NEGATIVE
Protein, ur: 100 mg/dL — AB
RBC / HPF: >50 RBC/hpf — ABNORMAL HIGH (ref 0–5)
Specific Gravity, Urine: 1.021 (ref 1.005–1.030)
pH: 6 (ref 5.0–8.0)

## 2019-06-29 LAB — COMPREHENSIVE METABOLIC PANEL
ALT: 10 U/L (ref 0–44)
AST: 14 U/L — ABNORMAL LOW (ref 15–41)
Albumin: 4.3 g/dL (ref 3.5–5.0)
Alkaline Phosphatase: 41 U/L (ref 38–126)
Anion gap: 11 (ref 5–15)
BUN: 16 mg/dL (ref 6–20)
CO2: 23 mmol/L (ref 22–32)
Calcium: 8.9 mg/dL (ref 8.9–10.3)
Chloride: 104 mmol/L (ref 98–111)
Creatinine, Ser: 0.59 mg/dL (ref 0.44–1.00)
GFR calc Af Amer: >60 mL/min (ref >60)
GFR calc non Af Amer: >60 mL/min (ref >60)
Glucose, Bld: 92 mg/dL (ref 70–99)
Potassium: 3.4 mmol/L — ABNORMAL LOW (ref 3.5–5.1)
Sodium: 138 mmol/L (ref 135–145)
Total Bilirubin: 0.3 mg/dL (ref 0.3–1.2)
Total Protein: 6.8 g/dL (ref 6.5–8.1)

## 2019-06-29 LAB — CBC
HCT: 34.8 % — ABNORMAL LOW (ref 36.0–46.0)
Hemoglobin: 11.6 g/dL — ABNORMAL LOW (ref 12.0–15.0)
MCH: 31.9 pg (ref 26.0–34.0)
MCHC: 33.3 g/dL (ref 30.0–36.0)
MCV: 95.6 fL (ref 80.0–100.0)
Platelets: 311 10*3/uL (ref 150–400)
RBC: 3.64 MIL/uL — ABNORMAL LOW (ref 3.87–5.11)
RDW: 11.9 % (ref 11.5–15.5)
WBC: 4.7 10*3/uL (ref 4.0–10.5)
nRBC: 0 % (ref 0.0–0.2)

## 2019-06-29 LAB — I-STAT BETA HCG BLOOD, ED (MC, WL, AP ONLY): I-stat hCG, quantitative: <5 m[IU]/mL (ref <5)

## 2019-06-29 LAB — LIPASE, BLOOD: Lipase: 19 U/L (ref 11–51)

## 2019-06-29 LAB — SARS CORONAVIRUS 2 (TAT 6-24 HRS): SARS Coronavirus 2: NEGATIVE

## 2019-06-29 MED ORDER — OXYCODONE-ACETAMINOPHEN 5-325 MG PO TABS
1.0000 | ORAL_TABLET | ORAL | Status: DC | PRN
  Administered 2019-06-29: 1 via ORAL
  Filled 2019-06-29: qty 1

## 2019-06-29 MED ORDER — ACETAMINOPHEN 500 MG PO TABS: 500.0000 mg | ORAL_TABLET | Freq: Four times a day (QID) | ORAL | 0 refills | Status: DC | PRN

## 2019-06-29 MED ORDER — SODIUM CHLORIDE 0.9% FLUSH: 3.0000 mL | Freq: Once | INTRAVENOUS | Status: DC

## 2019-06-29 MED ORDER — ONDANSETRON HCL 4 MG PO TABS: 4.0000 mg | ORAL_TABLET | Freq: Three times a day (TID) | ORAL | 0 refills | Status: DC | PRN

## 2019-06-29 NOTE — ED Notes (Signed)
Patient verbalizes understanding of discharge instructions. Opportunity for questioning and answers were provided. Armband removed by staff, pt discharged from ED ambulatory w/ family. Covid 19 quarantine instructions provided

## 2019-06-29 NOTE — ED Triage Notes (Signed)
Pt reports woke today with headache, diarrhea, nausea. Denies recent fever. States having cramping due to her period. No distress noted.

## 2019-06-29 NOTE — ED Provider Notes (Signed)
[facility] EMERGENCY DEPARTMENT Provider Note   CSN: [id number] Arrival date & time: 06/29/19  1212     History   Chief Complaint Chief Complaint  Patient presents with  . Diarrhea  . Nausea    HPI Tara [name] is a 33 y.o. female.     The history is provided by the patient. No language interpreter was used.  Diarrhea    33 year old female presenting with concerns of potential COVID-19 infection.  Patient report since yesterday she developed some throbbing headache, mild achiness to her lower back, having nausea, as well as 3 episodes of nonbloody nonmucousy diarrhea.  She does not complain of any runny nose sneezing or coughing, no sore throat, chest pain shortness of breath abdominal pain or dysuria.  She is currently on her menstruation.  She denies any recent sick contact.  She did take some over-the-counter medication at home for symptom.  No other complaint.  Denies any loss of taste or smell.  Past Medical History:  Diagnosis Date  . Headache(784.0)   . Heart murmur    as baby  . History of chlamydia   . History of gonorrhea   . Preterm labor   . Urinary tract infection     Patient Active Problem List   Diagnosis Date Noted  . Fracture of left distal radius 01/14/2014  . Nausea and vomiting of pregnancy, antepartum 03/11/2011    Past Surgical History:  Procedure Laterality Date  . DILATION AND CURETTAGE OF UTERUS    . INDUCED ABORTION    . LAPAROSCOPIC TUBAL LIGATION  01/09/2012   Procedure: LAPAROSCOPIC TUBAL LIGATION;  Surgeon: Kathreen Cosier, MD;  Location: [facility];  Service: Gynecology;  Laterality: Bilateral;  . ORIF WRIST FRACTURE Left 01/15/2014   Procedure: OPEN REDUCTION INTERNAL FIXATION (ORIF) WRIST FRACTURE;  Surgeon: Sharma Covert, MD;  Location: [facility];  Service: Orthopedics;  Laterality: Left;  . TUBAL LIGATION       OB History    Gravida  4   Para  2   Term  2   Preterm      AB  2   Living  2     SAB      TAB  2   Ectopic      Multiple      Live Births  2            Home Medications    Prior to Admission medications   Medication Sig Start Date End Date Taking? Authorizing Provider  carvedilol (COREG) 6.25 MG tablet Take 1 tablet (6.25 mg total) by mouth 2 (two) times daily with a meal. 04/28/18   Loletta Specter, PA-C    Family History Family History  Problem Relation Age of Onset  . Breast cancer Paternal Aunt        in 20's or 30's  . Anesthesia problems Neg Hx   . Hearing loss Neg Hx     Social History Social History   Tobacco Use  . Smoking status: Current Every Day Smoker    Packs/day: 0.25    Types: Cigarettes  . Smokeless tobacco: Never Used  Substance Use Topics  . Alcohol use: Not Currently  . Drug use: No     Allergies   Patient has no known allergies.   Review of Systems Review of Systems  Gastrointestinal: Positive for diarrhea.  All other systems reviewed and are negative.    Physical Exam Updated Vital Signs BP 116/73 (BP  Location: Left Arm)   Pulse 70   Temp 98.8 F (37.1 C) (Oral)   Resp 12   Ht 5\' 3"  (1.6 m)   Wt 52.2 kg   SpO2 100%   BMI 20.37 kg/m   Physical Exam Vitals signs and nursing note reviewed.  Constitutional:      General: She is not in acute distress.    Appearance: She is well-developed.  HENT:     Head: Atraumatic.  Eyes:     Extraocular Movements: Extraocular movements intact.     Conjunctiva/sclera: Conjunctivae normal.     Pupils: Pupils are equal, round, and reactive to light.  Neck:     Musculoskeletal: Neck supple. No neck rigidity.     Comments: No nuchal rigidity Cardiovascular:     Rate and Rhythm: Normal rate and regular rhythm.     Pulses: Normal pulses.     Heart sounds: Normal heart sounds.  Pulmonary:     Effort: Pulmonary effort is normal.     Breath sounds: Normal breath sounds.  Abdominal:     Palpations: Abdomen is soft.     Tenderness: There is no abdominal tenderness.   Skin:    Findings: No rash.  Neurological:     Mental Status: She is alert and oriented to person, place, and time.  Psychiatric:        Mood and Affect: Mood normal.      ED Treatments / Results  Labs (all labs ordered are listed, but only abnormal results are displayed) Labs Reviewed  COMPREHENSIVE METABOLIC PANEL - Abnormal; Notable for the following components:      Result Value   Potassium 3.4 (*)    AST 14 (*)    All other components within normal limits  CBC - Abnormal; Notable for the following components:   RBC 3.64 (*)    Hemoglobin 11.6 (*)    HCT 34.8 (*)    All other components within normal limits  URINALYSIS, ROUTINE W REFLEX MICROSCOPIC - Abnormal; Notable for the following components:   APPearance CLOUDY (*)    Hgb urine dipstick LARGE (*)    Protein, ur 100 (*)    RBC / HPF >50 (*)    Bacteria, UA FEW (*)    All other components within normal limits  SARS CORONAVIRUS 2 (TAT 6-24 HRS)  LIPASE, BLOOD  I-STAT BETA HCG BLOOD, ED ([facility], [facility], [facility] ONLY)    EKG None  Radiology No results found.  Procedures Procedures (including critical care time)  Medications Ordered in ED Medications  sodium chloride flush (NS) 0.9 % injection 3 mL (3 mLs Intravenous Not Given 06/29/19 1550)  oxyCODONE-acetaminophen (PERCOCET/ROXICET) 5-325 MG per tablet 1 tablet (1 tablet Oral Given 06/29/19 1457)     Initial Impression / Assessment and Plan / ED Course  I have reviewed the triage vital signs and the nursing notes.  Pertinent labs & imaging results that were available during my care of the patient were reviewed by me and considered in my medical decision making (see chart for details).        BP 116/73 (BP Location: Left Arm)   Pulse 70   Temp 98.8 F (37.1 C) (Oral)   Resp 12   Ht 5\' 3"  (1.6 m)   Wt 52.2 kg   SpO2 100%   BMI 20.37 kg/m    Final Clinical Impressions(s) / ED Diagnoses   Final diagnoses:  Suspected COVID-19 virus infection    ED  Discharge Orders  Ordered    ondansetron (ZOFRAN) 4 MG tablet  Every 8 hours PRN     06/29/19 1628    acetaminophen (TYLENOL) 500 MG tablet  Every 6 hours PRN     06/29/19 1628         3:23 PM Patient here with left-sided headache, low back pain, nausea and diarrhea.  She was concerned for potential COVID-19 infection without any obvious exposure.  She is well-appearing, afebrile, no hypoxia, vital signs stable.  Labs are reassuring.  Will obtain COVID-19 testing and patient will be notified if test positive for the next several days.  Appropriate self-isolation per CDC guideline was discussed.  Tara [name] was evaluated in Emergency Department on 06/29/2019 for the symptoms described in the history of present illness. She was evaluated in the context of the global COVID-19 pandemic, which necessitated consideration that the patient might be at risk for infection with the SARS-CoV-2 virus that causes COVID-19. Institutional protocols and algorithms that pertain to the evaluation of patients at risk for COVID-19 are in a state of rapid change based on information released by regulatory bodies including the CDC and federal and state organizations. These policies and algorithms were followed during the patient's care in the ED.    Domenic Moras, PA-C 06/29/19 1629    Carmin Muskrat, MD 06/30/19 [phone]

## 2019-06-29 NOTE — Discharge Instructions (Signed)
Your labs are normal today.  A covid test have been sent.  If positive, you will be notify in the next 2-3 days.  Follow instruction below.  Return if you have any concerns.

## 2019-09-28 ENCOUNTER — Emergency Department (HOSPITAL_COMMUNITY): Payer: Medicaid Other

## 2019-09-28 ENCOUNTER — Emergency Department (HOSPITAL_COMMUNITY)
Admission: EM | Admit: 2019-09-28 | Discharge: 2019-09-28 | Disposition: A | Discharge status: Home / Self Care | Payer: Medicaid Other | Attending: Emergency Medicine | Admitting: Emergency Medicine

## 2019-09-28 ENCOUNTER — Other Ambulatory Visit: Payer: Self-pay

## 2019-09-28 ENCOUNTER — Encounter (HOSPITAL_COMMUNITY): Payer: Self-pay | Admitting: Emergency Medicine

## 2019-09-28 DIAGNOSIS — Z79899 Other long term (current) drug therapy: Secondary | ICD-10-CM | POA: Insufficient documentation

## 2019-09-28 DIAGNOSIS — N12 Tubulo-interstitial nephritis, not specified as acute or chronic: Secondary | ICD-10-CM | POA: Insufficient documentation

## 2019-09-28 DIAGNOSIS — R109 Unspecified abdominal pain: Secondary | ICD-10-CM | POA: Diagnosis present

## 2019-09-28 DIAGNOSIS — F17210 Nicotine dependence, cigarettes, uncomplicated: Secondary | ICD-10-CM | POA: Insufficient documentation

## 2019-09-28 LAB — CBC
HCT: 35.3 % — ABNORMAL LOW (ref 36.0–46.0)
Hemoglobin: 11.2 g/dL — ABNORMAL LOW (ref 12.0–15.0)
MCH: 31 pg (ref 26.0–34.0)
MCHC: 31.7 g/dL (ref 30.0–36.0)
MCV: 97.8 fL (ref 80.0–100.0)
Platelets: 309 10*3/uL (ref 150–400)
RBC: 3.61 MIL/uL — ABNORMAL LOW (ref 3.87–5.11)
RDW: 11.8 % (ref 11.5–15.5)
WBC: 3.6 10*3/uL — ABNORMAL LOW (ref 4.0–10.5)
nRBC: 0 % (ref 0.0–0.2)

## 2019-09-28 LAB — URINALYSIS, ROUTINE W REFLEX MICROSCOPIC
Bilirubin Urine: NEGATIVE
Glucose, UA: NEGATIVE mg/dL
Ketones, ur: 5 mg/dL — AB
Nitrite: NEGATIVE
Protein, ur: 100 mg/dL — AB
Specific Gravity, Urine: 1.021 (ref 1.005–1.030)
WBC, UA: >50 WBC/hpf — ABNORMAL HIGH (ref 0–5)
pH: 7 (ref 5.0–8.0)

## 2019-09-28 LAB — COMPREHENSIVE METABOLIC PANEL
ALT: 8 U/L (ref 0–44)
AST: 16 U/L (ref 15–41)
Albumin: 4.1 g/dL (ref 3.5–5.0)
Alkaline Phosphatase: 39 U/L (ref 38–126)
Anion gap: 9 (ref 5–15)
BUN: 22 mg/dL — ABNORMAL HIGH (ref 6–20)
CO2: 23 mmol/L (ref 22–32)
Calcium: 8.9 mg/dL (ref 8.9–10.3)
Chloride: 106 mmol/L (ref 98–111)
Creatinine, Ser: 0.66 mg/dL (ref 0.44–1.00)
GFR calc Af Amer: >60 mL/min (ref >60)
GFR calc non Af Amer: >60 mL/min (ref >60)
Glucose, Bld: 90 mg/dL (ref 70–99)
Potassium: 3.6 mmol/L (ref 3.5–5.1)
Sodium: 138 mmol/L (ref 135–145)
Total Bilirubin: 0.7 mg/dL (ref 0.3–1.2)
Total Protein: 6.7 g/dL (ref 6.5–8.1)

## 2019-09-28 LAB — LIPASE, BLOOD: Lipase: 18 U/L (ref 11–51)

## 2019-09-28 LAB — I-STAT BETA HCG BLOOD, ED (MC, WL, AP ONLY): I-stat hCG, quantitative: <5 m[IU]/mL (ref <5)

## 2019-09-28 MED ORDER — ONDANSETRON HCL 4 MG PO TABS: 4.0000 mg | ORAL_TABLET | Freq: Every day | ORAL | 0 refills | Status: AC | PRN

## 2019-09-28 MED ORDER — ONDANSETRON 4 MG PO TBDP
4.0000 mg | ORAL_TABLET | Freq: Once | ORAL | Status: AC
  Administered 2019-09-28: 4 mg via ORAL
  Filled 2019-09-28: qty 1

## 2019-09-28 MED ORDER — OXYCODONE-ACETAMINOPHEN 5-325 MG PO TABS
1.0000 | ORAL_TABLET | Freq: Once | ORAL | Status: AC
  Administered 2019-09-28: 1 via ORAL
  Filled 2019-09-28: qty 1

## 2019-09-28 MED ORDER — CEPHALEXIN 500 MG PO CAPS: 500.0000 mg | ORAL_CAPSULE | Freq: Four times a day (QID) | ORAL | 0 refills | Status: AC

## 2019-09-28 NOTE — Discharge Instructions (Signed)
For your back pain, it could be that he had strained a muscle but it may also be that you have a urinary tract infection.  In order to prevent this from worsening we recommend that you take an antibiotic, Keflex 4 times a day for 10 days total.  If you develop any worsening symptoms, fevers or chills and do not hesitate to return.  For your pain please use ibuprofen and Tylenol.  I have also given you Zofran to help with any nausea that you may experience.  Please be sure to drink plenty of fluids and get rest.   You had a finding on your CT of your lung that was concerning that you may have Covid. You should quarantine (isolate at home) until the following criteria are met: At least 10 days since symptoms first appeared and At least 24 hours with no fever without fever-reducing medication and Other symptoms of COVID-19 are improving (Loss of taste and smell may persist for weeks or months after recovery and need not delay the end of isolation)  When to seek emergency medical attention Look for emergency warning signs* for COVID-19. If your or someone you know is showing any of these signs, seek emergency medical care immediately: Trouble breathing Persistent pain or pressure in the chest New confusion Inability to wake or stay awake Bluish lips or face Inability to tolerate fluids  *This list is not all possible symptoms. We discussed additional symptoms   Call 911 or call ahead to your local emergency facility: Notify the operator that you are seeking care for someone who has or may have COVID-19.

## 2019-09-28 NOTE — ED Provider Notes (Signed)
[place] EMERGENCY DEPARTMENT Provider Note  CSN: [id number] Arrival date & time: 09/28/19  1021   History Chief Complaint  Patient presents with  . Flank Pain   Tara [name] is a 34 y.o. female with no significant PMH here today for L-sided flank pain for 1 day that started during intercourse with her long-time partner this am. States she has never had pain like this before. She has not had anything to help with her pain. She denies any fevers, chills, dysuria, vomtiing. She reports having her tubes tied for contraception a few years ago and had a normal period last month on 2/13.. She reports she has had some blood in her urine. She endorses some nausea, no vomiting. She reports that the pain is sharp but is waxing and waning and made worse with certain movements. She denies any abdominal pain.  Past Medical History:  Diagnosis Date  . Headache(784.0)   . Heart murmur    as baby  . History of chlamydia   . History of gonorrhea   . Preterm labor   . Urinary tract infection    Patient Active Problem List   Diagnosis Date Noted  . Fracture of left distal radius 01/14/2014  . Nausea and vomiting of pregnancy, antepartum 03/11/2011   Past Surgical History:  Procedure Laterality Date  . DILATION AND CURETTAGE OF UTERUS    . INDUCED ABORTION    . LAPAROSCOPIC TUBAL LIGATION  01/09/2012   Procedure: LAPAROSCOPIC TUBAL LIGATION;  Surgeon: Frederico Hamman, MD;  Location: [place] [facility];  Service: Gynecology;  Laterality: Bilateral;  . ORIF WRIST FRACTURE Left 01/15/2014   Procedure: OPEN REDUCTION INTERNAL FIXATION (ORIF) WRIST FRACTURE;  Surgeon: Linna Hoff, MD;  Location: [place];  Service: Orthopedics;  Laterality: Left;  . TUBAL LIGATION      OB History    Gravida  4   Para  2   Term  2   Preterm      AB  2   Living  2     SAB      TAB  2   Ectopic      Multiple      Live Births  2          Family History  Problem Relation Age of Onset    . Breast cancer Paternal Aunt        in 20's or 28's  . Anesthesia problems Neg Hx   . Hearing loss Neg Hx    Social History   Tobacco Use  . Smoking status: Current Every Day Smoker    Packs/day: 0.25    Types: Cigarettes  . Smokeless tobacco: Never Used  Substance Use Topics  . Alcohol use: Not Currently  . Drug use: No   Home Medications Prior to Admission medications   Medication Sig Start Date End Date Taking? Authorizing Provider  acetaminophen (TYLENOL) 500 MG tablet Take 1 tablet (500 mg total) by mouth every 6 (six) hours as needed. 06/29/19   Domenic Moras, PA-C  carvedilol (COREG) 6.25 MG tablet Take 1 tablet (6.25 mg total) by mouth 2 (two) times daily with a meal. 04/28/18   Clent Demark, PA-C  cephALEXin (KEFLEX) 500 MG capsule Take 1 capsule (500 mg total) by mouth 4 (four) times daily for 10 days. 09/28/19 10/08/19  Connell Bognar, [place], DO  ondansetron (ZOFRAN) 4 MG tablet Take 1 tablet (4 mg total) by mouth every 8 (eight) hours as needed for nausea or  vomiting. 06/29/19   Fayrene Helper, PA-C  ondansetron (ZOFRAN) 4 MG tablet Take 1 tablet (4 mg total) by mouth daily as needed for nausea or vomiting. 09/28/19 09/27/20  Tnia Anglada, [place], DO   Allergies    Patient has no known allergies.  Review of Systems   Review of Systems  Constitutional: Negative for chills and fever.  Respiratory: Negative for shortness of breath.   Cardiovascular: Negative for chest pain.  Gastrointestinal: Positive for nausea. Negative for abdominal pain, blood in stool, constipation, diarrhea and vomiting.  Genitourinary: Positive for flank pain and hematuria. Negative for dysuria, frequency, pelvic pain, urgency, vaginal bleeding, vaginal discharge and vaginal pain.  Musculoskeletal: Negative for back pain.  Neurological: Negative for light-headedness and headaches.    Physical Exam Updated Vital Signs BP 100/62   Pulse 84   Temp 98.5 F (36.9 C) (Oral)   Resp 18   Ht 5\' 2"  (1.575 m)   Wt  53.1 kg   LMP 09/04/2019   SpO2 100%   BMI 21.40 kg/m   Physical Exam Vitals and nursing note reviewed.  Constitutional:      General: She is not in acute distress.    Appearance: Normal appearance.  HENT:     Head: Normocephalic and atraumatic.     Nose: Nose normal.     Mouth/Throat:     Mouth: Mucous membranes are moist.  Eyes:     Conjunctiva/sclera: Conjunctivae normal.  Cardiovascular:     Rate and Rhythm: Normal rate and regular rhythm.  Pulmonary:     Effort: Pulmonary effort is normal. No respiratory distress.     Breath sounds: Normal breath sounds.  Abdominal:     General: Abdomen is flat. Bowel sounds are normal. There is no distension.     Palpations: Abdomen is soft.     Tenderness: There is no abdominal tenderness. There is left CVA tenderness. There is no right CVA tenderness or guarding.  Musculoskeletal:        General: Normal range of motion.     Cervical back: Normal range of motion and neck supple.  Skin:    General: Skin is warm.     Capillary Refill: Capillary refill takes less than 2 seconds.  Neurological:     General: No focal deficit present.     Mental Status: She is alert and oriented to person, place, and time.  Psychiatric:        Mood and Affect: Mood normal.        Behavior: Behavior normal.    ED Results / Procedures / Treatments   Labs (all labs ordered are listed, but only abnormal results are displayed) Labs Reviewed  COMPREHENSIVE METABOLIC PANEL - Abnormal; Notable for the following components:      Result Value   BUN 22 (*)    All other components within normal limits  CBC - Abnormal; Notable for the following components:   WBC 3.6 (*)    RBC 3.61 (*)    Hemoglobin 11.2 (*)    HCT 35.3 (*)    All other components within normal limits  URINALYSIS, ROUTINE W REFLEX MICROSCOPIC - Abnormal; Notable for the following components:   APPearance HAZY (*)    Hgb urine dipstick MODERATE (*)    Ketones, ur 5 (*)    Protein, ur 100  (*)    Leukocytes,Ua MODERATE (*)    WBC, UA >50 (*)    Bacteria, UA RARE (*)    All other components within normal  limits  SARS CORONAVIRUS 2 (TAT 6-24 HRS)  LIPASE, BLOOD  I-STAT BETA HCG BLOOD, ED ([facility], [facility], [facility] ONLY)    EKG None  Radiology CT Renal Stone Study  Result Date: 09/28/2019 CLINICAL DATA:  Left flank pain EXAM: CT ABDOMEN AND PELVIS WITHOUT CONTRAST TECHNIQUE: Multidetector CT imaging of the abdomen and pelvis was performed following the standard protocol without oral or IV contrast. COMPARISON:  None. FINDINGS: Lower chest: There is a small area of ill-defined opacity in the lateral segment right middle lobe. A second small focus of increased opacity is noted in the medial segment right middle lobe. Visualized lung bases are otherwise clear except for slight lateral left base atelectasis. Hepatobiliary: No focal liver lesions are evident on this noncontrast enhanced study. Gallbladder wall is not appreciably thickened. There is no biliary duct dilatation. Pancreas: No pancreatic mass or inflammatory focus. Spleen: No splenic lesions are evident. Adrenals/Urinary Tract: Adrenals bilaterally appear normal. Kidneys bilaterally show no evident mass or hydronephrosis on either side. There is no appreciable renal or ureteral calculus on either side. Urinary bladder is midline with wall thickness within normal limits. Stomach/Bowel: There is moderate stool in the colon. There is no appreciable bowel wall or mesenteric thickening. There is no demonstrable bowel obstruction. The terminal ileum appears unremarkable. There is no bowel obstruction. There is no appreciable free air or portal venous air. Vascular/Lymphatic: There is no abdominal aortic aneurysm. No vascular lesions are evident on this noncontrast enhanced study. There is no appreciable adenopathy in the abdomen or pelvis. Reproductive: Uterus is anteverted.  No evident pelvic mass. Other: Appendix appears normal. No abscess or ascites  evident in the abdomen or pelvis. Musculoskeletal: No blastic or lytic bone lesions. No intramuscular or abdominal wall lesions are evident. IMPRESSION: 1. Areas of somewhat ill-defined airspace opacity in the right middle lobe. Question atypical organism pneumonia. This finding may warrant COVID-19 testing. 2. No renal or ureteral calculus. No hydronephrosis. Urinary bladder wall thickness normal. 3. No bowel obstruction or bowel wall thickening. No abscess in the abdomen or pelvis. Appendix appears normal. Electronically Signed   By: Bretta Bang III M.D.   On: 09/28/2019 14:00    Procedures Procedures (including critical care time)  Medications Ordered in ED Medications  oxyCODONE-acetaminophen (PERCOCET/ROXICET) 5-325 MG per tablet 1-2 tablet (1 tablet Oral Given 09/28/19 1338)  ondansetron (ZOFRAN-ODT) disintegrating tablet 4 mg (4 mg Oral Given 09/28/19 1425)    ED Course  I have reviewed the triage vital signs and the nursing notes.  Pertinent labs & imaging results that were available during my care of the patient were reviewed by me and considered in my medical decision making (see chart for details).    MDM Rules/Calculators/A&P                      34 yo female here with left-sided flank pain that started suddenly this am while having intercourse with her long-time partner. She denies any trauma. She reports no urinary symptoms aside from some mild hematuria. She denies any systemic sx. Pregnancy test negative in ED. Given absence of abdominal pain, low suspicion for appendicitis, diverticulitis or ovarian torsion. Notable CVA tenderness on left side only and UA with some signs concerning for infection and stone, but is dirty catch with 11-20 epithelial cells. Could be MSK in origin giving mechanism of pain. Given norco x1 for pain. Will obtain CT renal to rule out stone vs pyelonephritis.   CT renal stone with  no signs of stone, infection or bladder wall thickening. There is  concern for abnormal pneumonia and recommended follow up with COVID testing- patient currently with no URI symptoms, no known sick contacts and O2 of 100% on room air. Patient declined COVID testing for follow up outpatient, but instructed on proper social distancing and mask wearing. Given UA and pain, will treat patient for possible pyelonephritis. Patient to have keflex 500mg  qid for 10 days. Whiel giving patient discharge papers, she had an episode of emesis. Offered to allow her to stay and receive more medication, but she stated she felt better and would like to go home with antibiotics and zofran. Discussed risks vs benefits and strict return precautions and patient voiced understanding.  Tara [name] was evaluated in Emergency Department on 09/28/2019 for the symptoms described in the history of present illness. She was evaluated in the context of the global COVID-19 pandemic, which necessitated consideration that the patient might be at risk for infection with the SARS-CoV-2 virus that causes COVID-19. Institutional protocols and algorithms that pertain to the evaluation of patients at risk for COVID-19 are in a state of rapid change based on information released by regulatory bodies including the CDC and federal and state organizations. These policies and algorithms were followed during the patient's care in the ED. Final Clinical Impression(s) / ED Diagnoses Final diagnoses:  Pyelonephritis    Rx / DC Orders ED Discharge Orders         Ordered    ondansetron (ZOFRAN) 4 MG tablet  Daily PRN     09/28/19 1441    cephALEXin (KEFLEX) 500 MG capsule  4 times daily     09/28/19 1441           Latrisha Coiro, 11/28/19, DO 09/28/19 1452    Little, 11/28/19, MD 09/29/19 1105

## 2019-09-28 NOTE — ED Notes (Signed)
Patient Alert and oriented to baseline. Stable and ambulatory to baseline. Patient verbalized understanding of the discharge instructions.  Patient belongings were taken by the patient.   

## 2019-09-28 NOTE — ED Notes (Signed)
Patient refusing covid swab stating she does not need it. Patient states she just wants to go home since we are not able to tell her what is wrong and where her pain is coming from. RN attempted to reposition patient for comfort and refused.  EDP notified.

## 2019-09-28 NOTE — ED Triage Notes (Signed)
Pt arrives via [facility] with reports of left side flank pain taht started today during intercourse. Denies any pelvic pain.

## 2020-07-12 ENCOUNTER — Other Ambulatory Visit: Payer: Self-pay

## 2020-07-12 ENCOUNTER — Ambulatory Visit (HOSPITAL_COMMUNITY)
Admission: EM | Admit: 2020-07-12 | Discharge: 2020-07-12 | Disposition: A | Discharge status: Home / Self Care | Payer: Medicaid Other | Attending: Family Medicine | Admitting: Family Medicine

## 2020-07-12 ENCOUNTER — Encounter (HOSPITAL_COMMUNITY): Payer: Self-pay

## 2020-07-12 ENCOUNTER — Encounter (HOSPITAL_COMMUNITY): Payer: Self-pay | Admitting: Emergency Medicine

## 2020-07-12 ENCOUNTER — Emergency Department (HOSPITAL_COMMUNITY)
Admission: EM | Admit: 2020-07-12 | Discharge: 2020-07-12 | Disposition: A | Discharge status: Home / Self Care | Payer: Medicaid Other | Attending: Emergency Medicine | Admitting: Emergency Medicine

## 2020-07-12 DIAGNOSIS — N3090 Cystitis, unspecified without hematuria: Secondary | ICD-10-CM | POA: Diagnosis present

## 2020-07-12 DIAGNOSIS — R6883 Chills (without fever): Secondary | ICD-10-CM | POA: Insufficient documentation

## 2020-07-12 DIAGNOSIS — R059 Cough, unspecified: Secondary | ICD-10-CM | POA: Insufficient documentation

## 2020-07-12 DIAGNOSIS — R519 Headache, unspecified: Secondary | ICD-10-CM | POA: Diagnosis not present

## 2020-07-12 DIAGNOSIS — Z20822 Contact with and (suspected) exposure to covid-19: Secondary | ICD-10-CM | POA: Insufficient documentation

## 2020-07-12 DIAGNOSIS — R630 Anorexia: Secondary | ICD-10-CM | POA: Diagnosis not present

## 2020-07-12 DIAGNOSIS — Z5321 Procedure and treatment not carried out due to patient leaving prior to being seen by health care provider: Secondary | ICD-10-CM | POA: Diagnosis not present

## 2020-07-12 LAB — POCT URINALYSIS DIPSTICK, ED / UC
Bilirubin Urine: NEGATIVE
Glucose, UA: NEGATIVE mg/dL
Ketones, ur: >=160 mg/dL — AB
Nitrite: POSITIVE — AB
Protein, ur: 30 mg/dL — AB
Specific Gravity, Urine: >=1.03 (ref 1.005–1.030)
Urobilinogen, UA: 1 mg/dL (ref 0.0–1.0)
pH: 6 (ref 5.0–8.0)

## 2020-07-12 LAB — SARS CORONAVIRUS 2 (TAT 6-24 HRS): SARS Coronavirus 2: NEGATIVE

## 2020-07-12 LAB — POC URINE PREG, ED: Preg Test, Ur: NEGATIVE

## 2020-07-12 MED ORDER — CEPHALEXIN 500 MG PO CAPS: 500.0000 mg | ORAL_CAPSULE | Freq: Two times a day (BID) | ORAL | 0 refills | Status: DC

## 2020-07-12 NOTE — ED Notes (Signed)
Pt also c/o productive cough with clear sputum, HA, low back pain. Denies dysuria sx. Has taken ibuprofen-last taken at 0900 800mg . Denies nausea at present, but states nausea w/o emesis earlier today and yesterday. Denies h/o COVID infection, no COVID vaccines.

## 2020-07-12 NOTE — ED Triage Notes (Signed)
Pt c/o hot/cold chills, HA, loss of appetite, non productive cough, since yesterday.  Pt denies fever, denies n/v/d, denies loss of taste and smell, denies sick contact.

## 2020-07-12 NOTE — ED Provider Notes (Addendum)
[facility]    ASSESSMENT & PLAN:  1. Cystitis    Begin: Meds ordered this encounter  Medications  . cephALEXin (KEFLEX) 500 MG capsule    Sig: Take 1 capsule (500 mg total) by mouth 2 (two) times daily.    Dispense:  10 capsule    Refill:  0   No signs of pyelonephritis. Discussed. She is not concerned re: STI. No testing sent. Urine culture sent. Will notify patient of any significant results. Will follow up with her PCP or here if not showing improvement over the next 48 hours, sooner if needed. COVID testing sent.  Outlined signs and symptoms indicating need for more acute intervention. Patient verbalized understanding. After Visit Summary given.  SUBJECTIVE:  Tara [name] is a 34 y.o. female who reports nausea with sporadic emesis and body aches for two days. Lower back aches. Without associated ffever, chills, vaginal discharge or bleeding. Gross hematuria: not present. No specific aggravating or alleviating factors reported. No LE edema. Without specific abdominal pain. Ambulatory without difficulty. OTC treatment: none. No sick contacts.  LMP: Patient's last menstrual period was 07/02/2020.  OBJECTIVE:  Vitals:   07/12/20 1318  BP: 102/63  Pulse: 100  Resp: 18  Temp: 97.7 F (36.5 C)  TempSrc: Temporal  SpO2: 100%   General appearance: alert; no distress HENT: oropharynx: moist Lungs: unlabored respirations Abdomen: soft, non-tender Back: no CVA tenderness Extremities: no edema; symmetrical with no gross deformities Skin: warm and dry Neurologic: normal gait Psychological: alert and cooperative; normal mood and affect  Labs Reviewed  POCT URINALYSIS DIPSTICK, ED / UC - Abnormal; Notable for the following components:      Result Value   Ketones, ur >=160 (*)    Hgb urine dipstick SMALL (*)    Protein, ur 30 (*)    Nitrite POSITIVE (*)    Leukocytes,Ua SMALL (*)    All other components within normal limits  SARS CORONAVIRUS 2 (TAT  6-24 HRS)  URINE CULTURE  POC URINE PREG, ED    No Known Allergies  Past Medical History:  Diagnosis Date  . Headache(784.0)   . Heart murmur    as baby  . History of chlamydia   . History of gonorrhea   . Preterm labor   . Urinary tract infection    Social History   Socioeconomic History  . Marital status: Divorced    Spouse name: Not on file  . Number of children: Not on file  . Years of education: Not on file  . Highest education level: Not on file  Occupational History  . Not on file  Tobacco Use  . Smoking status: Former Smoker    Packs/day: 0.25    Types: Cigarettes  . Smokeless tobacco: Never Used  Substance and Sexual Activity  . Alcohol use: Not Currently  . Drug use: No  . Sexual activity: Yes    Birth control/protection: Surgical  Other Topics Concern  . Not on file  Social History Narrative  . Not on file   Social Determinants of Health   Financial Resource Strain: Not on file  Food Insecurity: Not on file  Transportation Needs: Not on file  Physical Activity: Not on file  Stress: Not on file  Social Connections: Not on file  Intimate Partner Violence: Not on file   Family History  Problem Relation Age of Onset  . Breast cancer Paternal Aunt        in 20's or 30's  . Anesthesia problems  Neg Hx   . Hearing loss Neg Hx        Mardella Layman, MD 07/12/20 1800    Mardella Layman, MD 07/12/20 1800

## 2020-07-12 NOTE — Discharge Instructions (Addendum)
You have been tested for COVID-19 today. °If your test returns positive, you will receive a phone call from [facility] regarding your results. °Negative test results are not called. °Both positive and negative results area always visible on MyChart. °If you do not have a MyChart account, sign up instructions are provided in your discharge papers. °Please do not hesitate to contact us should you have questions or concerns. ° °

## 2020-07-12 NOTE — ED Triage Notes (Signed)
Pt presents with nausea, vomiting and body aches x 2 days. She states she has lower back pain.

## 2020-07-14 LAB — URINE CULTURE: Culture: >=100000 — AB

## 2021-06-21 ENCOUNTER — Ambulatory Visit (HOSPITAL_COMMUNITY)
Admission: EM | Admit: 2021-06-21 | Discharge: 2021-06-21 | Disposition: A | Discharge status: Home / Self Care | Payer: Medicaid Other | Attending: Internal Medicine | Admitting: Internal Medicine

## 2021-06-21 ENCOUNTER — Encounter (HOSPITAL_COMMUNITY): Payer: Self-pay

## 2021-06-21 ENCOUNTER — Other Ambulatory Visit: Payer: Self-pay

## 2021-06-21 DIAGNOSIS — J040 Acute laryngitis: Secondary | ICD-10-CM

## 2021-06-21 MED ORDER — BENZONATATE 100 MG PO CAPS: 100.0000 mg | ORAL_CAPSULE | Freq: Three times a day (TID) | ORAL | 0 refills | Status: DC | PRN

## 2021-06-21 NOTE — ED Triage Notes (Signed)
Pt presents to the office today for cough,fever,diarrhea and body aches x 5 days.

## 2021-06-21 NOTE — Discharge Instructions (Addendum)
This take medications as prescribed Increase oral fluid intake Return to urgent care if symptoms worsen. There is no indication for flu or COVID testing.

## 2021-06-24 NOTE — ED Provider Notes (Signed)
[facility]    CSN: [id number] Arrival date & time: 06/21/21  1031      History   Chief Complaint Chief Complaint  Patient presents with   Abdominal Pain   Cough   Sore Throat   Chills    HPI Tara [name] is a 35 y.o. female to the urgent care with a 5-day history of nonproductive cough, subjective fever generalized body aches and diarrhea.  Patient's symptoms started insidiously and has been persistent.  Cough is nonproductive.  No shortness of breath or wheezing.  Over the past couple of days patient has developed hoarseness of voice.  Cough is worse at night.  Patient is vaccinated against COVID-19 virus.  She denies any sick contacts.  Patient has had some nonbloody nonmucoid diarrhea.  No abdominal pain or distention.  No recent travel.  No change in dietary habits.   HPI  Past Medical History:  Diagnosis Date   Headache(784.0)    Heart murmur    as baby   History of chlamydia    History of gonorrhea    Preterm labor    Urinary tract infection     Patient Active Problem List   Diagnosis Date Noted   Fracture of left distal radius 01/14/2014   Nausea and vomiting of pregnancy, antepartum 03/11/2011    Past Surgical History:  Procedure Laterality Date   DILATION AND CURETTAGE OF UTERUS     INDUCED ABORTION     LAPAROSCOPIC TUBAL LIGATION  01/09/2012   Procedure: LAPAROSCOPIC TUBAL LIGATION;  Surgeon: Kathreen Cosier, MD;  Location: [facility];  Service: Gynecology;  Laterality: Bilateral;   ORIF WRIST FRACTURE Left 01/15/2014   Procedure: OPEN REDUCTION INTERNAL FIXATION (ORIF) WRIST FRACTURE;  Surgeon: Sharma Covert, MD;  Location: [facility];  Service: Orthopedics;  Laterality: Left;   TUBAL LIGATION      OB History     Gravida  4   Para  2   Term  2   Preterm      AB  2   Living  2      SAB      IAB  2   Ectopic      Multiple      Live Births  2            Home Medications    Prior to Admission medications    Medication Sig Start Date End Date Taking? Authorizing Provider  benzonatate (TESSALON) 100 MG capsule Take 1 capsule (100 mg total) by mouth 3 (three) times daily as needed for cough. 06/21/21  Yes Kenita Bines, Britta Mccreedy, MD  acetaminophen (TYLENOL) 500 MG tablet Take 1 tablet (500 mg total) by mouth every 6 (six) hours as needed. 06/29/19   Fayrene Helper, PA-C  carvedilol (COREG) 6.25 MG tablet Take 1 tablet (6.25 mg total) by mouth 2 (two) times daily with a meal. 04/28/18   Loletta Specter, PA-C    Family History Family History  Problem Relation Age of Onset   Breast cancer Paternal Aunt        in 20's or 30's   Anesthesia problems Neg Hx    Hearing loss Neg Hx     Social History Social History   Tobacco Use   Smoking status: Former    Packs/day: 0.25    Types: Cigarettes   Smokeless tobacco: Never  Substance Use Topics   Alcohol use: Not Currently   Drug use: No     Allergies  Patient has no known allergies.   Review of Systems Review of Systems  Constitutional:  Positive for chills and fever.  HENT:  Positive for sore throat.   Respiratory:  Positive for cough. Negative for shortness of breath and wheezing.   Gastrointestinal:  Positive for diarrhea. Negative for abdominal pain, nausea and vomiting.  Musculoskeletal:  Positive for myalgias. Negative for arthralgias and back pain.    Physical Exam Triage Vital Signs ED Triage Vitals  Enc Vitals Group     BP 06/21/21 1203 (!) 113/58     Pulse Rate 06/21/21 1203 65     Resp 06/21/21 1203 18     Temp 06/21/21 1203 98.7 F (37.1 C)     Temp Source 06/21/21 1203 Oral     SpO2 06/21/21 1203 100 %     Weight --      Height --      Head Circumference --      Peak Flow --      Pain Score 06/21/21 1204 5     Pain Loc --      Pain Edu? --      Excl. in GC? --    No data found.  Updated Vital Signs BP (!) 113/58 (BP Location: Left Arm)   Pulse 65   Temp 98.7 F (37.1 C) (Oral)   Resp 18   LMP  (LMP Unknown)    SpO2 100%   Visual Acuity Right Eye Distance:   Left Eye Distance:   Bilateral Distance:    Right Eye Near:   Left Eye Near:    Bilateral Near:     Physical Exam Vitals and nursing note reviewed.  Constitutional:      General: She is not in acute distress.    Appearance: She is not ill-appearing.  Cardiovascular:     Rate and Rhythm: Normal rate and regular rhythm.  Pulmonary:     Effort: Pulmonary effort is normal. No respiratory distress.     Breath sounds: Normal breath sounds. No wheezing or rhonchi.  Abdominal:     General: Abdomen is flat. Bowel sounds are normal.     Palpations: Abdomen is soft.     Tenderness: There is no abdominal tenderness.  Neurological:     Mental Status: She is alert.     UC Treatments / Results  Labs (all labs ordered are listed, but only abnormal results are displayed) Labs Reviewed - No data to display  EKG   Radiology No results found.  Procedures Procedures (including critical care time)  Medications Ordered in UC Medications - No data to display  Initial Impression / Assessment and Plan / UC Course  I have reviewed the triage vital signs and the nursing notes.  Pertinent labs & imaging results that were available during my care of the patient were reviewed by me and considered in my medical decision making (see chart for details).     1.  Acute viral laryngitis: Increase oral fluid intake Tylenol/Motrin as needed for pain No indication for COVID or flu testing given the duration of symptoms Tessalon Perles as needed for cough Voice rest Return to urgent care if symptoms worsen. Final Clinical Impressions(s) / UC Diagnoses   Final diagnoses:  Acute viral laryngitis     Discharge Instructions      This take medications as prescribed Increase oral fluid intake Return to urgent care if symptoms worsen. There is no indication for flu or COVID testing.   ED Prescriptions  Medication Sig Dispense Auth.  Provider   benzonatate (TESSALON) 100 MG capsule Take 1 capsule (100 mg total) by mouth 3 (three) times daily as needed for cough. 21 capsule Ashely Goosby, Britta Mccreedy, MD      PDMP not reviewed this encounter.   Merrilee Jansky, MD 06/24/21 2202

## 2021-08-28 ENCOUNTER — Encounter (INDEPENDENT_AMBULATORY_CARE_PROVIDER_SITE_OTHER): Payer: Self-pay | Admitting: Primary Care

## 2021-08-28 ENCOUNTER — Other Ambulatory Visit: Payer: Self-pay

## 2021-08-28 ENCOUNTER — Ambulatory Visit (INDEPENDENT_AMBULATORY_CARE_PROVIDER_SITE_OTHER): Payer: Medicaid Other | Admitting: Primary Care

## 2021-08-28 VITALS — BP 101/67 | HR 72 | Temp 97.9°F | Ht 63.0 in | Wt 111.6 lb

## 2021-08-28 DIAGNOSIS — Z23 Encounter for immunization: Secondary | ICD-10-CM

## 2021-08-28 DIAGNOSIS — D508 Other iron deficiency anemias: Secondary | ICD-10-CM

## 2021-08-28 DIAGNOSIS — N921 Excessive and frequent menstruation with irregular cycle: Secondary | ICD-10-CM

## 2021-08-28 DIAGNOSIS — Z7689 Persons encountering health services in other specified circumstances: Secondary | ICD-10-CM | POA: Diagnosis not present

## 2021-08-28 DIAGNOSIS — Z131 Encounter for screening for diabetes mellitus: Secondary | ICD-10-CM

## 2021-08-28 LAB — POCT GLYCOSYLATED HEMOGLOBIN (HGB A1C): Hemoglobin A1C: 4.8 % (ref 4.0–5.6)

## 2021-08-28 MED ORDER — IBUPROFEN 400 MG PO TABS: 400.0000 mg | ORAL_TABLET | Freq: Three times a day (TID) | ORAL | 1 refills | Status: DC | PRN

## 2021-08-28 NOTE — Patient Instructions (Signed)
Influenza, Adult °Influenza is also called "the flu." It is an infection in the lungs, nose, and throat (respiratory tract). It spreads easily from person to person (is contagious). The flu causes symptoms that are like a cold, along with high fever and body aches. °What are the causes? °This condition is caused by the influenza virus. You can get the virus by: °Breathing in droplets that are in the air after a person infected with the flu coughed or sneezed. °Touching something that has the virus on it and then touching your mouth, nose, or eyes. °What increases the risk? °Certain things may make you more likely to get the flu. These include: °Not washing your hands often. °Having close contact with many people during cold and flu season. °Touching your mouth, eyes, or nose without first washing your hands. °Not getting a flu shot every year. °You may have a higher risk for the flu, and serious problems, such as a lung infection (pneumonia), if you: °Are older than 65. °Are pregnant. °Have a weakened disease-fighting system (immune system) because of a disease or because you are taking certain medicines. °Have a long-term (chronic) condition, such as: °Heart, kidney, or lung disease. °Diabetes. °Asthma. °Have a liver disorder. °Are very overweight (morbidly obese). °Have anemia. °What are the signs or symptoms? °Symptoms usually begin suddenly and last 4-14 days. They may include: °Fever and chills. °Headaches, body aches, or muscle aches. °Sore throat. °Cough. °Runny or stuffy (congested) nose. °Feeling discomfort in your chest. °Not wanting to eat as much as normal. °Feeling weak or tired. °Feeling dizzy. °Feeling sick to your stomach or throwing up. °How is this treated? °If the flu is found early, you can be treated with antiviral medicine. This can help to reduce how bad the illness is and how long it lasts. This may be given by mouth or through an IV tube. °Taking care of yourself at home can help your  symptoms get better. Your doctor may want you to: °Take over-the-counter medicines. °Drink plenty of fluids. °The flu often goes away on its own. If you have very bad symptoms or other problems, you may be treated in a hospital. °Follow these instructions at home: °  °Activity °Rest as needed. Get plenty of sleep. °Stay home from work or school as told by your doctor. °Do not leave home until you do not have a fever for 24 hours without taking medicine. °Leave home only to go to your doctor. °Eating and drinking °Take an ORS (oral rehydration solution). This is a drink that is sold at pharmacies and stores. °Drink enough fluid to keep your pee pale yellow. °Drink clear fluids in small amounts as you are able. Clear fluids include: °Water. °Ice chips. °Fruit juice mixed with water. °Low-calorie sports drinks. °Eat bland foods that are easy to digest. Eat small amounts as you are able. These foods include: °Bananas. °Applesauce. °Rice. °Lean meats. °Toast. °Crackers. °Do not eat or drink: °Fluids that have a lot of sugar or caffeine. °Alcohol. °Spicy or fatty foods. °General instructions °Take over-the-counter and prescription medicines only as told by your doctor. °Use a cool mist humidifier to add moisture to the air in your home. This can make it easier for you to breathe. °When using a cool mist humidifier, clean it daily. Empty water and replace with clean water. °Cover your mouth and nose when you cough or sneeze. °Wash your hands with soap and water often and for at least 20 seconds. This is also important after   you cough or sneeze. If you cannot use soap and water, use alcohol-based hand sanitizer. °Keep all follow-up visits. °How is this prevented? ° °Get a flu shot every year. You may get the flu shot in late summer, fall, or winter. Ask your doctor when you should get your flu shot. °Avoid contact with people who are sick during fall and winter. This is cold and flu season. °Contact a doctor if: °You get  new symptoms. °You have: °Chest pain. °Watery poop (diarrhea). °A fever. °Your cough gets worse. °You start to have more mucus. °You feel sick to your stomach. °You throw up. °Get help right away if you: °Have shortness of breath. °Have trouble breathing. °Have skin or nails that turn a bluish color. °Have very bad pain or stiffness in your neck. °Get a sudden headache. °Get sudden pain in your face or ear. °Cannot eat or drink without throwing up. °These symptoms may represent a serious problem that is an emergency. Get medical help right away. Call your local emergency services (911 in the [place] °Do not wait to see if the symptoms will go away. °Do not drive yourself to the hospital. °Summary °Influenza is also called "the flu." It is an infection in the lungs, nose, and throat. It spreads easily from person to person. °Take over-the-counter and prescription medicines only as told by your doctor. °Getting a flu shot every year is the best way to not get the flu. °This information is not intended to replace advice given to you by your health care provider. Make sure you discuss any questions you have with your health care provider. °Document Revised: 02/25/2020 Document Reviewed: 02/25/2020 °Elsevier Patient Education © 2022 [employer] ° °

## 2021-08-28 NOTE — Progress Notes (Signed)
New Patient Office Visit  Subjective:  Patient ID: Tara [name], female    DOB: 05-09-1986  Age: 36 y.o. MRN: [id number]  CC:  Chief Complaint  Patient presents with   New Patient (Initial Visit)    Severe pain on left side with menstrual cycle and heavy bleeding since having tubes tied. Pain radiates down left leg and into her back. Has not discussed with OBGYN    HPI Tara [name] is a 36 year old under weight female ( thin frame) presents for establishment of care.  She complains of menorrhalgia with severe left side back pain she has noticed this has been occurring since she had her tubes tied.  Patient has No ,  chest pain, No abdominal pain - No Nausea, No new weakness tingling or numbness, No Cough - shortness of breath. Headache are stress related. Past Medical History:  Diagnosis Date   Headache(784.0)    Heart murmur    as baby   History of chlamydia    History of gonorrhea    Preterm labor    Urinary tract infection     Past Surgical History:  Procedure Laterality Date   DILATION AND CURETTAGE OF UTERUS     INDUCED ABORTION     LAPAROSCOPIC TUBAL LIGATION  01/09/2012   Procedure: LAPAROSCOPIC TUBAL LIGATION;  Surgeon: Frederico Hamman, MD;  Location: [facility];  Service: Gynecology;  Laterality: Bilateral;   ORIF WRIST FRACTURE Left 01/15/2014   Procedure: OPEN REDUCTION INTERNAL FIXATION (ORIF) WRIST FRACTURE;  Surgeon: Linna Hoff, MD;  Location: [facility];  Service: Orthopedics;  Laterality: Left;   TUBAL LIGATION      Family History  Problem Relation Age of Onset   Breast cancer Paternal Aunt        in 20's or 30's   Anesthesia problems Neg Hx    Hearing loss Neg Hx     Social History   Socioeconomic History   Marital status: Significant Other    Spouse name: Not on file   Number of children: Not on file   Years of education: Not on file   Highest education level: Not on file  Occupational History   Not on file  Tobacco Use   Smoking status:  Former    Packs/day: 0.25    Types: Cigarettes   Smokeless tobacco: Never  Substance and Sexual Activity   Alcohol use: Not Currently   Drug use: No   Sexual activity: Yes    Birth control/protection: Surgical  Other Topics Concern   Not on file  Social History Narrative   Not on file   Social Determinants of Health   Financial Resource Strain: Not on file  Food Insecurity: Not on file  Transportation Needs: Not on file  Physical Activity: Not on file  Stress: Not on file  Social Connections: Not on file  Intimate Partner Violence: Not on file    ROS Comprehensive ROS Pertinent positive and negative noted in HPI    Objective:   Today's Vitals: BP 101/67 (BP Location: Right Arm, Patient Position: Sitting, Cuff Size: Normal)    Pulse 72    Temp 97.9 F (36.6 C) (Oral)    Ht _0  (1.6 m)    Wt 111 lb 9.6 oz (50.6 kg)    LMP 08/06/2021 (Exact Date)    SpO2 97%    BMI 19.77 kg/m   Physical exam: General: Vital signs reviewed.  Patient is well-developed and well-nourished, Body mass index is 19.77  kg/m. female in no acute distress and cooperative with exam. Head: Normocephalic and atraumatic. Eyes: EOMI, conjunctivae normal, no scleral icterus. Neck: Supple, trachea midline, normal ROM, no JVD, masses, thyromegaly, or carotid bruit present. Cardiovascular: RRR, S1 normal, S2 normal, no murmurs, gallops, or rubs. Pulmonary/Chest: Clear to auscultation bilaterally, no wheezes, rales, or rhonchi. Abdominal: Soft, non-tender, non-distended, BS +, no masses, organomegaly, or guarding present. Musculoskeletal: No joint deformities, erythema, or stiffness, ROM full and nontender. Extremities: No lower extremity edema bilaterally,  pulses symmetric and intact bilaterally. No cyanosis or clubbing. Neurological: A&O x3, Strength is normal Skin: Warm, dry and intact. No rashes or erythema. Psychiatric: Normal mood and affect. speech and behavior is normal. Cognition and memory are  normal.    Assessment & Plan:  Vendetta was seen today for new patient (initial visit).  Diagnoses and all orders for this visit:  Screening for diabetes mellitus -     HgB A1c 4.8 -   Encounter to establish care Establish care with new PCP  Menorrhagia with irregular cycle Feels this started after her tubes tide  -     CBC with Differential  Other iron deficiency anemia -     CBC with Differential -     CMP14+EGFR  Need for immunization against influenza -     Flu Vaccine QUAD 56moIM (Fluarix, Fluzone & Alfiuria Quad PF)  Other orders -     ibuprofen (ADVIL) 400 MG tablet; Take 1 tablet (400 mg total) by mouth every 8 (eight) hours as needed.    Outpatient Encounter Medications as of 08/28/2021  Medication Sig   ibuprofen (ADVIL) 400 MG tablet Take 1 tablet (400 mg total) by mouth every 8 (eight) hours as needed.   [DISCONTINUED] acetaminophen (TYLENOL) 500 MG tablet Take 1 tablet (500 mg total) by mouth every 6 (six) hours as needed.   [DISCONTINUED] benzonatate (TESSALON) 100 MG capsule Take 1 capsule (100 mg total) by mouth 3 (three) times daily as needed for cough.   [DISCONTINUED] carvedilol (COREG) 6.25 MG tablet Take 1 tablet (6.25 mg total) by mouth 2 (two) times daily with a meal.   No facility-administered encounter medications on file as of 08/28/2021.    Follow-up: Return for schedule pap.   MKerin Perna NP

## 2021-08-29 LAB — CBC WITH DIFFERENTIAL/PLATELET
Basophils Absolute: 0 10*3/uL (ref 0.0–0.2)
Basos: 1 %
EOS (ABSOLUTE): 0.1 10*3/uL (ref 0.0–0.4)
Eos: 3 %
Hematocrit: 33.5 % — ABNORMAL LOW (ref 34.0–46.6)
Hemoglobin: 10.9 g/dL — ABNORMAL LOW (ref 11.1–15.9)
Immature Grans (Abs): 0 10*3/uL (ref 0.0–0.1)
Immature Granulocytes: 0 %
Lymphocytes Absolute: 1.7 10*3/uL (ref 0.7–3.1)
Lymphs: 33 %
MCH: 31 pg (ref 26.6–33.0)
MCHC: 32.5 g/dL (ref 31.5–35.7)
MCV: 95 fL (ref 79–97)
Monocytes Absolute: 0.5 10*3/uL (ref 0.1–0.9)
Monocytes: 9 %
Neutrophils Absolute: 2.8 10*3/uL (ref 1.4–7.0)
Neutrophils: 54 %
Platelets: 325 10*3/uL (ref 150–450)
RBC: 3.52 x10E6/uL — ABNORMAL LOW (ref 3.77–5.28)
RDW: 11.8 % (ref 11.7–15.4)
WBC: 5.1 10*3/uL (ref 3.4–10.8)

## 2021-08-29 LAB — CMP14+EGFR
ALT: 9 IU/L (ref 0–32)
AST: 15 IU/L (ref 0–40)
Albumin/Globulin Ratio: 1.8 (ref 1.2–2.2)
Albumin: 4.2 g/dL (ref 3.8–4.8)
Alkaline Phosphatase: 65 IU/L (ref 44–121)
BUN/Creatinine Ratio: 32 — ABNORMAL HIGH (ref 9–23)
BUN: 21 mg/dL — ABNORMAL HIGH (ref 6–20)
Bilirubin Total: 0.3 mg/dL (ref 0.0–1.2)
CO2: 24 mmol/L (ref 20–29)
Calcium: 9.3 mg/dL (ref 8.7–10.2)
Chloride: 104 mmol/L (ref 96–106)
Creatinine, Ser: 0.66 mg/dL (ref 0.57–1.00)
Globulin, Total: 2.4 g/dL (ref 1.5–4.5)
Glucose: 70 mg/dL (ref 70–99)
Potassium: 4.2 mmol/L (ref 3.5–5.2)
Sodium: 142 mmol/L (ref 134–144)
Total Protein: 6.6 g/dL (ref 6.0–8.5)
eGFR: 118 mL/min/{1.73_m2} (ref >59)

## 2021-09-10 ENCOUNTER — Other Ambulatory Visit (HOSPITAL_COMMUNITY)
Admission: RE | Admit: 2021-09-10 | Discharge: 2021-09-10 | Disposition: A | Discharge status: Home / Self Care | Payer: Medicaid Other | Source: Ambulatory Visit | Attending: Primary Care | Admitting: Primary Care

## 2021-09-10 ENCOUNTER — Other Ambulatory Visit: Payer: Self-pay

## 2021-09-10 ENCOUNTER — Ambulatory Visit (INDEPENDENT_AMBULATORY_CARE_PROVIDER_SITE_OTHER): Payer: Medicaid Other | Admitting: Primary Care

## 2021-09-10 ENCOUNTER — Encounter (INDEPENDENT_AMBULATORY_CARE_PROVIDER_SITE_OTHER): Payer: Self-pay | Admitting: Primary Care

## 2021-09-10 VITALS — BP 101/56 | HR 96 | Temp 98.0°F | Ht 63.0 in | Wt 113.8 lb

## 2021-09-10 DIAGNOSIS — Z113 Encounter for screening for infections with a predominantly sexual mode of transmission: Secondary | ICD-10-CM | POA: Diagnosis present

## 2021-09-10 DIAGNOSIS — Z124 Encounter for screening for malignant neoplasm of cervix: Secondary | ICD-10-CM

## 2021-09-10 NOTE — Progress Notes (Signed)
°  [facility] Patient name: Tara [name] MRN [id number]  Date of birth: [birth year] Chief Complaint:   Gynecologic Exam  History of Present Illness:   Tara [name] is a 36 y.o. [phone] female being seen today for a routine well-woman exam.   CC: gyn   The current method of family planning is tubal ligation.  Patient's last menstrual period was 08/11/2021 (exact date). Last pap unknown . Review of Systems:    Denies any headaches, blurred vision, fatigue, shortness of breath, chest pain, abdominal pain, abnormal vaginal discharge/itching/odor/irritation, problems with periods, bowel movements, urination, or intercourse unless otherwise stated above.  Pertinent History Reviewed:   Reviewed past medical,surgical, social and family history.  Reviewed problem list, medications and allergies.  Physical Assessment:   Vitals:   09/10/21 1542  BP: (!) 101/56  Pulse: 96  Temp: 98 F (36.7 C)  TempSrc: Oral  SpO2: 95%  Weight: 113 lb 12.8 oz (51.6 kg)  Height: 5\' 3"  (1.6 m)  Body mass index is 20.16 kg/m.        Physical Examination:  General appearance - well appearing, and in no distress Mental status - alert, oriented to person, place, and time Psych:  She has a normal mood and affect Skin - warm and dry, normal color, no suspicious lesions noted Chest - effort normal, all lung fields clear to auscultation bilaterally Heart - normal rate and regular rhythm Neck:  midline trachea, no thyromegaly or nodules Breasts - breasts appear normal, no suspicious masses, no skin or nipple changes or axillary nodes Educated patient on proper self breast examination and had patient to demonstrate SBE. Abdomen - soft, nontender, nondistended, no masses or organomegaly Pelvic-VULVA: normal appearing vulva with no masses, tenderness or lesions   VAGINA: normal appearing vagina with normal color and discharge, no lesions   CERVIX: normal  appearing cervix without discharge or lesions, no CMT UTERUS: uterus is felt to be normal size, shape, consistency and nontender  ADNEXA: No adnexal masses or tenderness noted. Extremities:  No swelling or varicosities noted  No results found for this or any previous visit (from the past 24 hour(s)).   Assessment & Plan:  Edita was seen today for gynecologic exam.  Diagnoses and all orders for this visit:  Screening for STD (sexually transmitted disease) -     Cervicovaginal ancillary only  Cervical cancer screening -     Cytology - [facility])   Follow-up: Return if symptoms worsen or fail to improve.  This note has been created with Surveyor, quantity. Any transcriptional errors are unintentional.   Kerin Perna, NP 09/10/2021, 4:02 PM

## 2021-09-10 NOTE — Patient Instructions (Signed)
Pap Test °Why am I having this test? °A Pap test, also called a Pap smear, is a screening test to check for signs of: °Infection. °Cancer of the cervix. The cervix is the lower part of the uterus that opens into the vagina. °Changes that may be a sign that cancer is developing (precancerous changes). °Women need this test on a regular basis. In general, you should have a Pap test every 3 years until you reach menopause or age 35. Women aged 30-60 may choose to have their Pap test done at the same time as an HPV (human papillomavirus) test every 5 years (instead of every 3 years). °Your health care provider may recommend having Pap tests more or less often depending on your medical conditions and past Pap test results. °What is being tested? °Cervical cells are tested for signs of infection or abnormalities. °What kind of sample is taken? °Your health care provider will collect a sample of cells from the surface of your cervix. This will be done using a small cotton swab, plastic spatula, or brush that is inserted into your vagina using a tool called a speculum. This sample is often collected during a pelvic exam, when you are lying on your back on an exam table with your feet in footrests (stirrups). In some cases, fluids (secretions) from the cervix or vagina may also be collected. °How do I prepare for this test? °Be aware of where you are in your menstrual cycle. If you are menstruating on the day of the test, you may be asked to reschedule. °You may need to reschedule if you have a known vaginal infection on the day of the test. °Follow instructions from your health care provider about: °Changing or stopping your regular medicines. Some medicines can cause abnormal test results, such as vaginal medicines and tetracycline. °Avoiding douching 2-3 days before or the day of the test. °Tell a health care provider about: °Any allergies you have. °All medicines you are taking, including vitamins, herbs, eye drops,  creams, and over-the-counter medicines. °Any bleeding problems you have. °Any surgeries you have had. °Any medical conditions you have. °Whether you are pregnant or may be pregnant. °How are the results reported? °Your test results will be reported as either abnormal or normal. °What do the results mean? °A normal test result means that you do not have signs of cancer of the cervix. °An abnormal result may mean that you have: °Cancer. A Pap test by itself is not enough to diagnose cancer. You will have more tests done if cancer is suspected. °Precancerous changes in your cervix. °Inflammation of the cervix. °An STI (sexually transmitted infection). °A fungal infection. °A parasite infection. °Talk with your health care provider about what your results mean. In some cases, your health care provider may do more testing to confirm the results. °Questions to ask your health care provider °Ask your health care provider, or the department that is doing the test: °When will my results be ready? °How will I get my results? °What are my treatment options? °What other tests do I need? °What are my next steps? °Summary °In general, women should have a Pap test every 3 years until they reach menopause or age 35. °Your health care provider will collect a sample of cells from the surface of your cervix. This will be done using a small cotton swab, plastic spatula, or brush. °In some cases, fluids (secretions) from the cervix or vagina may also be collected. °This information is not intended   to replace advice given to you by your health care provider. Make sure you discuss any questions you have with your health care provider. °Document Revised: 10/06/2020 Document Reviewed: 10/06/2020 °Elsevier Patient Education © 2022 Elsevier Inc. ° °

## 2021-09-11 ENCOUNTER — Encounter (INDEPENDENT_AMBULATORY_CARE_PROVIDER_SITE_OTHER): Payer: Self-pay | Admitting: Primary Care

## 2021-09-12 ENCOUNTER — Other Ambulatory Visit (INDEPENDENT_AMBULATORY_CARE_PROVIDER_SITE_OTHER): Payer: Self-pay | Admitting: Primary Care

## 2021-09-12 DIAGNOSIS — B379 Candidiasis, unspecified: Secondary | ICD-10-CM

## 2021-09-12 LAB — CERVICOVAGINAL ANCILLARY ONLY
Bacterial Vaginitis (gardnerella): POSITIVE — AB
Candida Glabrata: NEGATIVE
Candida Vaginitis: POSITIVE — AB
Chlamydia: NEGATIVE
Comment: NEGATIVE
Comment: NEGATIVE
Comment: NEGATIVE
Comment: NEGATIVE
Comment: NEGATIVE
Comment: NORMAL
Neisseria Gonorrhea: NEGATIVE
Trichomonas: POSITIVE — AB

## 2021-09-12 MED ORDER — METRONIDAZOLE 500 MG PO TABS: 500.0000 mg | ORAL_TABLET | Freq: Two times a day (BID) | ORAL | 0 refills | Status: DC

## 2021-09-12 MED ORDER — FLUCONAZOLE 150 MG PO TABS: 150.0000 mg | ORAL_TABLET | Freq: Once | ORAL | 0 refills | Status: AC

## 2021-09-14 LAB — CYTOLOGY - PAP
Comment: NEGATIVE
High risk HPV: NEGATIVE

## 2022-02-18 ENCOUNTER — Encounter (INDEPENDENT_AMBULATORY_CARE_PROVIDER_SITE_OTHER): Payer: Self-pay | Admitting: Primary Care

## 2022-02-18 ENCOUNTER — Ambulatory Visit (INDEPENDENT_AMBULATORY_CARE_PROVIDER_SITE_OTHER): Payer: Medicaid Other | Admitting: Primary Care

## 2022-02-18 ENCOUNTER — Other Ambulatory Visit (HOSPITAL_COMMUNITY)
Admission: RE | Admit: 2022-02-18 | Discharge: 2022-02-18 | Disposition: A | Discharge status: Home / Self Care | Payer: Medicaid Other | Source: Ambulatory Visit | Attending: Primary Care | Admitting: Primary Care

## 2022-02-18 VITALS — BP 115/76 | HR 66 | Temp 98.2°F | Ht 63.0 in | Wt 105.2 lb

## 2022-02-18 DIAGNOSIS — A5901 Trichomonal vulvovaginitis: Secondary | ICD-10-CM | POA: Insufficient documentation

## 2022-02-18 DIAGNOSIS — Z23 Encounter for immunization: Secondary | ICD-10-CM

## 2022-02-18 DIAGNOSIS — Z113 Encounter for screening for infections with a predominantly sexual mode of transmission: Secondary | ICD-10-CM

## 2022-02-18 DIAGNOSIS — N760 Acute vaginitis: Secondary | ICD-10-CM | POA: Insufficient documentation

## 2022-02-18 NOTE — Patient Instructions (Addendum)
Tdap (Tetanus, Diphtheria, Pertussis) Vaccine: What You Need to Know 1. Why get vaccinated? Tdap vaccine can prevent tetanus, diphtheria, and pertussis. Diphtheria and pertussis spread from person to person. Tetanus enters the body through cuts or wounds. TETANUS (T) causes painful stiffening of the muscles. Tetanus can lead to serious health problems, including being unable to open the mouth, having trouble swallowing and breathing, or death. DIPHTHERIA (D) can lead to difficulty breathing, heart failure, paralysis, or death. PERTUSSIS (aP), also known as "whooping cough," can cause uncontrollable, violent coughing that makes it hard to breathe, eat, or drink. Pertussis can be extremely serious especially in babies and young children, causing pneumonia, convulsions, brain damage, or death. In teens and adults, it can cause weight loss, loss of bladder control, passing out, and rib fractures from severe coughing. 2. Tdap vaccine Tdap is only for children 7 years and older, adolescents, and adults.  Adolescents should receive a single dose of Tdap, preferably at age 10 or 71 years. Pregnant people should get a dose of Tdap during every pregnancy, preferably during the early part of the third trimester, to help protect the newborn from pertussis. Infants are most at risk for severe, life-threatening complications from pertussis. Adults who have never received Tdap should get a dose of Tdap. Also, adults should receive a booster dose of either Tdap or Td (a different vaccine that protects against tetanus and diphtheria but not pertussis) every 10 years, or after 5 years in the case of a severe or dirty wound or burn. Tdap may be given at the same time as other vaccines. 3. Talk with your health care provider Tell your vaccine provider if the person getting the vaccine: Has had an allergic reaction after a previous dose of any vaccine that protects against tetanus, diphtheria, or pertussis, or has any  severe, life-threatening allergies Has had a coma, decreased level of consciousness, or prolonged seizures within 7 days after a previous dose of any pertussis vaccine (DTP, DTaP, or Tdap) Has seizures or another nervous system problem Has ever had Guillain-Barr Syndrome (also called "GBS") Has had severe pain or swelling after a previous dose of any vaccine that protects against tetanus or diphtheria In some cases, your health care provider may decide to postpone Tdap vaccination until a future visit. People with minor illnesses, such as a cold, may be vaccinated. People who are moderately or severely ill should usually wait until they recover before getting Tdap vaccine.  Your health care provider can give you more information. 4. Risks of a vaccine reaction Pain, redness, or swelling where the shot was given, mild fever, headache, feeling tired, and nausea, vomiting, diarrhea, or stomachache sometimes happen after Tdap vaccination. People sometimes faint after medical procedures, including vaccination. Tell your provider if you feel dizzy or have vision changes or ringing in the ears.  As with any medicine, there is a very remote chance of a vaccine causing a severe allergic reaction, other serious injury, or death. 5. What if there is a serious problem? An allergic reaction could occur after the vaccinated person leaves the clinic. If you see signs of a severe allergic reaction (hives, swelling of the face and throat, difficulty breathing, a fast heartbeat, dizziness, or weakness), call 9-1-1 and get the person to the nearest hospital. For other signs that concern you, call your health care provider.  Adverse reactions should be reported to the Vaccine Adverse Event Reporting System (VAERS). Your health care provider will usually file this report, or you  can do it yourself. Visit the VAERS website at www.vaers.LAgents.no or call 570-244-8916. VAERS is only for reporting reactions, and VAERS staff  members do not give medical advice. 6. The National Vaccine Injury Compensation Program The [employer] Vaccine Injury Compensation Program (VICP) is a federal program that was created to compensate people who may have been injured by certain vaccines. Claims regarding alleged injury or death due to vaccination have a time limit for filing, which may be as short as two years. Visit the VICP website at SpiritualWord.at or call 514-011-8752 to learn about the program and about filing a claim. 7. How can I learn more? Ask your health care provider. Call your local or state health department. Visit the website of the Food and Drug Administration (FDA) for vaccine package inserts and additional information at FinderList.no. Contact the Centers for Disease Control and Prevention (CDC): Call 956-296-4961 (1-800-CDC-INFO) or Visit CDC's website at PicCapture.uy. Source: CDC Vaccine Information Statement Tdap (Tetanus, Diphtheria, Pertussis) Vaccine (02/25/2020) This same material is available at FootballExhibition.com.br for no charge. This information is not intended to replace advice given to you by your health care provider. Make sure you discuss any questions you have with your health care provider. Document Revised: 06/06/2021 Document Reviewed: 04/09/2021 Elsevier Patient Education  2023 Elsevier Inc. Human Papillomavirus Human papillomavirus (HPV) is the most common sexually transmitted infection (STI). It easily spreads from person to person (is very contagious). There are many types of HPV. HPV often does not cause symptoms. Sometimes it may cause warts in the genitals or anus. These warts can be seen and felt. There may also be wart-like lesions in the throat. You can have HPV for a long time and not know it. You may spread HPV to others without knowing it. Certain types of HPV may cause cancer. What are the causes? HPV is caused by a virus that spreads  through skin-to-skin contact or contact through sex. What increases the risk? You may be more likely to get HPV if you have or have had: Contact with a person who has HPV. Unprotected sex of any kind. Several sex partners. A sex partner who has other sex partners. Another sexually transmitted infection (STI). A weak disease-fighting system (immune system). Damage to the skin. What are the signs or symptoms? Most people who have HPV do not have any symptoms. If you have symptoms, they may include: Wart-like lesions in the throat from having oral sex. Warts on the infected areas. Warts in the genitals. The warts may itch, burn, bleed, or be painful during sex. How is this treated? There is no treatment for the virus itself. However, there are treatments for the health problems and symptoms HPV can cause. Your doctor may treat HPV by: Giving medicines that are creams, lotions, liquids, or gels. These medicines may be injected into or put onto warts in the genitals or anus. Applying one of the following to the warts in the genitals or anus: Something that is very cold. A strong beam of light (laser treatment). Something very hot. Doing surgery to remove the warts in the genitals or anus. Your doctor will monitor you closely after you are treated. HPV can come back and you may need treatment again. Follow these instructions at home: Medicines Take over-the-counter and prescription medicines only as told by your doctor. Do not treat warts in the genitals with medicines that are meant for warts in the hands. General instructions Do not touch or scratch the warts. Do not have sex while  you are being treated. Do not douche or use tampons during treatment (for women). Tell your sex partner about your infection. He or she may also need to be treated. If you get pregnant, tell your doctor that you have HPV. Your doctor will monitor you during the pregnancy. Keep all follow-up visits. This is  important. How is this prevented? Talk with your doctor about getting an HPV vaccine. This can prevent some HPV infections and related cancers. You may need 2-3 doses of the vaccine, depending on your age. The vaccine will not work if you already have HPV. The vaccine is not recommended for pregnant women. After treatment, use condoms during sex. This helps to prevent future infections. Have only one sex partner. Have a sex partner who does not have other sex partners. Get Pap tests as told by your doctor. Contact a doctor if: The treated skin gets red, swollen, or painful. You have a fever. You feel ill. You feel lumps or pimples in and around your genitals or anus. You have bleeding from the vagina. You have bleeding from the area that was treated. You have pain during sex. Summary HPV is the most common STI. It easily spreads from person to person. HPV often does not cause any symptoms. HPV vaccine can prevent some HPV infections and related cancers. There is no treatment for the virus itself. However, there are treatments for the health problems and symptoms HPV can cause. This information is not intended to replace advice given to you by your health care provider. Make sure you discuss any questions you have with your health care provider. Document Revised: 02/22/2020 Document Reviewed: 02/22/2020 Elsevier Patient Education  2023 [employer].

## 2022-02-18 NOTE — Progress Notes (Signed)
[facility]   History of Present Illness   Patient Identification Tara [name] is a 36 y.o. female.  Patient information was obtained from patient. History/Exam limitations: none.  Chief Complaint  STD testing   Patient presents with a complaint of vaginal discharge. Onset of symptoms was gradual starting 7 days ago, and has been unchanged since that time. The patient describes the discharge as white, creamy, and malodorous and does not experience abdominal discomfort with the discharge. She does not complain of burning with urination. She denies genital lesions at this time. The patient does  have a history of STD's or PID and denies multiple sexual partners. She is not homosexual. The patient is HIV negative. Requesting STD testing new sex partner.  After exam patient voiced concerned about a raised knott behind her left ear and is not present behind the right ear. Sometimes this area hurts when she is trying to lay on her left side of face.  Past Medical History:  Diagnosis Date   Headache(784.0)    Heart murmur    as baby   History of chlamydia    History of gonorrhea    Preterm labor    Urinary tract infection    Family History  Problem Relation Age of Onset   Breast cancer Paternal Aunt        in 20's or 30's   Anesthesia problems Neg Hx    Hearing loss Neg Hx    Scheduled Meds: Continuous Infusions: PRN Meds:  No Known Allergies Social History   Socioeconomic History   Marital status: Significant Other    Spouse name: Not on file   Number of children: Not on file   Years of education: Not on file   Highest education level: Not on file  Occupational History   Not on file  Tobacco Use   Smoking status: Former    Packs/day: 0.25    Types: Cigarettes   Smokeless tobacco: Never  Substance and Sexual Activity   Alcohol use: Not Currently   Drug use: No   Sexual activity: Yes    Birth control/protection: Surgical  Other Topics Concern    Not on file  Social History Narrative   Not on file   Social Determinants of Health   Financial Resource Strain: Not on file  Food Insecurity: Not on file  Transportation Needs: Not on file  Physical Activity: Not on file  Stress: Not on file  Social Connections: Not on file  Intimate Partner Violence: Not on file   Review of Systems Pertinent items noted in HPI and remainder of comprehensive ROS otherwise negative.   Physical Exam   BP 115/76 (BP Location: Right Arm, Patient Position: Sitting, Cuff Size: Small)   Pulse 66   Temp 98.2 F (36.8 C) (Oral)   Ht 5\' 3"  (1.6 m)   Wt 105 lb 3.2 oz (47.7 kg)   LMP 01/29/2022 (Approximate)   SpO2 100%   BMI 18.64 kg/m  General:   alert, cooperative, appears stated age, and no distress  Heart: regular rate and rhythm, S1, S2 normal, no murmur, click, rub or gallop  Lungs: clear to auscultation bilaterally  Abdomen: soft, non-tender, without masses or organomegaly  Musculoskeletal: Normal muscle tone, no tenderness on palpation of tibia, no excessive thoracic kyphosis. Skin: Appropriate warmth, no visible rash.raised knott behind her left ear  Mental status: Alert, conversant, speech clear, thought logical, appropriate mood and affect, no hallucinations or delusions evident. Hematologic/lymphatic: No cervical adenopathy,  no visible ecchymoses. Tara [name] was seen today for std testing.  Diagnoses and all orders for this visit:  Need for Tdap vaccination -     Tdap vaccine greater than or equal to 7yo IM  Screening for STD (sexually transmitted disease) -     Cervicovaginal ancillary only -     HIV antibody (with reflex)  Need for HPV vaccine    Tara Sessions, NP 02/18/2022, 3:32 PM

## 2022-02-18 NOTE — Progress Notes (Signed)
Rashes STD testing

## 2022-02-19 ENCOUNTER — Telehealth (INDEPENDENT_AMBULATORY_CARE_PROVIDER_SITE_OTHER): Payer: Self-pay | Admitting: Primary Care

## 2022-02-19 LAB — HIV ANTIBODY (ROUTINE TESTING W REFLEX)

## 2022-02-19 NOTE — Telephone Encounter (Signed)
Copied from CRM [id number]. Topic: General - Other >> Feb 19, 2022 12:23 PM Macon Large wrote: Reason for CRM: Juanda Chance with [facility] reports that the pt sample was located and a report with the results will be sent. Cb# [phone]

## 2022-02-20 ENCOUNTER — Other Ambulatory Visit (INDEPENDENT_AMBULATORY_CARE_PROVIDER_SITE_OTHER): Payer: Self-pay | Admitting: Primary Care

## 2022-02-20 LAB — CERVICOVAGINAL ANCILLARY ONLY
Bacterial Vaginitis (gardnerella): POSITIVE — AB
Candida Glabrata: NEGATIVE
Candida Vaginitis: NEGATIVE
Chlamydia: NEGATIVE
Comment: NEGATIVE
Comment: NEGATIVE
Comment: NEGATIVE
Comment: NEGATIVE
Comment: NEGATIVE
Comment: NORMAL
Neisseria Gonorrhea: NEGATIVE
Trichomonas: POSITIVE — AB

## 2022-02-20 NOTE — Telephone Encounter (Signed)
Routed to PCP 

## 2022-02-25 ENCOUNTER — Other Ambulatory Visit (INDEPENDENT_AMBULATORY_CARE_PROVIDER_SITE_OTHER): Payer: Self-pay | Admitting: Primary Care

## 2022-02-25 DIAGNOSIS — A599 Trichomoniasis, unspecified: Secondary | ICD-10-CM

## 2022-02-25 MED ORDER — METRONIDAZOLE 500 MG PO TABS: 500.0000 mg | ORAL_TABLET | Freq: Two times a day (BID) | ORAL | 0 refills | Status: DC

## 2022-03-21 ENCOUNTER — Encounter (INDEPENDENT_AMBULATORY_CARE_PROVIDER_SITE_OTHER): Payer: Self-pay | Admitting: Primary Care

## 2022-03-21 ENCOUNTER — Ambulatory Visit (INDEPENDENT_AMBULATORY_CARE_PROVIDER_SITE_OTHER): Payer: Medicaid Other | Admitting: Primary Care

## 2022-03-21 VITALS — BP 101/63 | HR 81 | Temp 98.1°F | Ht 63.0 in | Wt 107.0 lb

## 2022-03-21 DIAGNOSIS — R21 Rash and other nonspecific skin eruption: Secondary | ICD-10-CM

## 2022-03-21 DIAGNOSIS — Z23 Encounter for immunization: Secondary | ICD-10-CM

## 2022-03-21 NOTE — Patient Instructions (Signed)

## 2022-03-21 NOTE — Progress Notes (Signed)
  [facility]  Tara [name], is a 36 y.o. female  WHQ:[id number]  [id number]  DOB - October 21, 1985  Chief Complaint  Patient presents with   Follow-up    Rash on abdomen  Resolved with antibiotics        Subjective:   Tara [name] is a 36 y.o. female here today for a follow up visit for a rash that presented on her abdomen.  She was treated for a different problem Flagyl rash self resolved.  Patient has No headache, No chest pain, No abdominal pain - No Nausea, No new weakness tingling or numbness, No Cough - shortness of breath  No problems updated.  No Known Allergies  Past Medical History:  Diagnosis Date   Headache(784.0)    Heart murmur    as baby   History of chlamydia    History of gonorrhea    Preterm labor    Urinary tract infection     Current Outpatient Medications on File Prior to Visit  Medication Sig Dispense Refill   ibuprofen (ADVIL) 400 MG tablet Take 1 tablet (400 mg total) by mouth every 8 (eight) hours as needed. 90 tablet 1   No current facility-administered medications on file prior to visit.    Objective:   Vitals:   03/21/22 1536  BP: 101/63  Pulse: 81  Temp: 98.1 F (36.7 C)  TempSrc: Oral  SpO2: 98%  Weight: 107 lb (48.5 kg)  Height: 5\' 3"  (1.6 m)    Exam General appearance : Awake, alert, not in any distress. Speech Clear. Not toxic looking HEENT: Atraumatic and Normocephalic, pupils equally reactive to light and accomodation Neck: Supple, no JVD. No cervical lymphadenopathy.  Chest: Good air entry bilaterally, no added sounds  CVS: S1 S2 regular, no murmurs.  Abdomen: Bowel sounds present, Non tender and not distended with no gaurding, rigidity or rebound. Extremities: B/L Lower Ext shows no edema, both legs are warm to touch Neurology: Awake alert, and oriented X 3, , Non focal Skin: No Rash  Data Review Lab Results  Component Value Date   HGBA1C 4.8 08/28/2021    Assessment & Plan  Doretta was  seen today for follow-up.  Diagnoses and all orders for this visit:  Need for HPV vaccine -     HPV 9-valent vaccine,Recombinant  Rash and nonspecific skin eruption Self resolved with Flagyl treatment for a different problem.  Need for immunization against influenza -     Flu Vaccine QUAD 50mo+IM (Fluarix, Fluzone & Alfiuria Quad PF)    Patient have been counseled extensively about nutrition and exercise. Other issues discussed during this visit include: low cholesterol diet, weight control and daily exercise, foot care, annual eye examinations at Ophthalmology, importance of adherence with medications and regular follow-up. We also discussed long term complications of uncontrolled diabetes and hypertension.   Return for annual physical.  The patient was given clear instructions to go to ER or return to medical center if symptoms don't improve, worsen or new problems develop. The patient verbalized understanding. The patient was told to call to get lab results if they haven't heard anything in the next week.   This note has been created with 5mo. Any transcriptional errors are unintentional.   Education officer, environmental, NP 03/21/2022, 3:45 PM

## 2022-04-02 ENCOUNTER — Encounter (INDEPENDENT_AMBULATORY_CARE_PROVIDER_SITE_OTHER): Payer: Self-pay | Admitting: Primary Care

## 2022-04-02 ENCOUNTER — Ambulatory Visit (INDEPENDENT_AMBULATORY_CARE_PROVIDER_SITE_OTHER): Payer: Medicaid Other | Admitting: Primary Care

## 2022-04-02 VITALS — BP 101/64 | HR 74 | Resp 16 | Ht 62.5 in | Wt 106.0 lb

## 2022-04-02 DIAGNOSIS — D508 Other iron deficiency anemias: Secondary | ICD-10-CM

## 2022-04-02 DIAGNOSIS — R6889 Other general symptoms and signs: Secondary | ICD-10-CM | POA: Diagnosis not present

## 2022-04-02 DIAGNOSIS — Z0001 Encounter for general adult medical examination with abnormal findings: Secondary | ICD-10-CM | POA: Diagnosis not present

## 2022-04-02 DIAGNOSIS — Z0000 Encounter for general adult medical examination without abnormal findings: Secondary | ICD-10-CM

## 2022-04-02 NOTE — Patient Instructions (Signed)

## 2022-04-02 NOTE — Progress Notes (Signed)
Tara [name] is a 36 y.o. female presents to office today for annual physical exam examination.    Concerns today include: 1. None  Occupation: Scientist, water quality, Marital status: S, Substance use: No Diet: no, Exercise: yes  Health Maintenance  Topic Date Due   HPV VACCINES (2 - 3-dose SCDM series) 04/18/2022   COVID-19 Vaccine (1) 04/06/2022 (Originally 05/15/1986)   Hepatitis C Screening  08/28/2022 (Originally 11/12/2004)   PAP SMEAR-Modifier  09/10/2024   TETANUS/TDAP  02/19/2032   INFLUENZA VACCINE  Completed   HIV Screening  Completed     Past Medical History:  Diagnosis Date   Headache(784.0)    Heart murmur    as baby   History of chlamydia    History of gonorrhea    Preterm labor    Urinary tract infection    Social History   Socioeconomic History   Marital status: Significant Other    Spouse name: Not on file   Number of children: Not on file   Years of education: Not on file   Highest education level: Not on file  Occupational History   Not on file  Tobacco Use   Smoking status: Former    Packs/day: 0.25    Types: Cigarettes   Smokeless tobacco: Never  Substance and Sexual Activity   Alcohol use: Not Currently   Drug use: No   Sexual activity: Yes    Birth control/protection: Surgical  Other Topics Concern   Not on file  Social History Narrative   Not on file   Social Determinants of Health   Financial Resource Strain: Not on file  Food Insecurity: Not on file  Transportation Needs: Not on file  Physical Activity: Not on file  Stress: Not on file  Social Connections: Not on file  Intimate Partner Violence: Not on file   Past Surgical History:  Procedure Laterality Date   DILATION AND CURETTAGE OF UTERUS     INDUCED ABORTION     LAPAROSCOPIC TUBAL LIGATION  01/09/2012   Procedure: LAPAROSCOPIC TUBAL LIGATION;  Surgeon: Frederico Hamman, MD;  Location: [facility];  Service: Gynecology;  Laterality: Bilateral;   ORIF  WRIST FRACTURE Left 01/15/2014   Procedure: OPEN REDUCTION INTERNAL FIXATION (ORIF) WRIST FRACTURE;  Surgeon: Linna Hoff, MD;  Location: [facility];  Service: Orthopedics;  Laterality: Left;   TUBAL LIGATION     Family History  Problem Relation Age of Onset   Breast cancer Paternal Aunt        in 20's or 30's   Anesthesia problems Neg Hx    Hearing loss Neg Hx     Current Outpatient Medications:    ibuprofen (ADVIL) 400 MG tablet, Take 1 tablet (400 mg total) by mouth every 8 (eight) hours as needed., Disp: 90 tablet, Rfl: 1 Outpatient Encounter Medications as of 04/02/2022  Medication Sig   ibuprofen (ADVIL) 400 MG tablet Take 1 tablet (400 mg total) by mouth every 8 (eight) hours as needed.   No facility-administered encounter medications on file as of 04/02/2022.    No Known Allergies   ROS: Review of Systems Pertinent items noted in HPI and remainder of comprehensive ROS otherwise negative.    Physical exam: General: Vital signs reviewed.  Patient is well-developed and well-nourished, underweight female in no acute distress and cooperative with exam. Head: Normocephalic and atraumatic. Eyes: EOMI, conjunctivae normal, no scleral icterus. Neck: Supple, trachea midline, normal ROM, no JVD, masses, thyromegaly, or carotid bruit present. Cardiovascular: RRR,  S1 normal, S2 normal, no murmurs, gallops, or rubs. Pulmonary/Chest: Clear to auscultation bilaterally, no wheezes, rales, or rhonchi. Abdominal: Soft, non-tender, non-distended, BS +, no masses, organomegaly, or guarding present. Musculoskeletal: No joint deformities, erythema, or stiffness, ROM full and nontender. Extremities: No lower extremity edema bilaterally,  pulses symmetric and intact bilaterally. No cyanosis or clubbing. Neurological: A&O x3, Strength is normal Skin: Warm, dry and intact. No rashes or erythema. Psychiatric: Normal mood and affect. speech and behavior is normal. Cognition and memory are normal.       Assessment/ Plan: Tara [name] here for annual physical exam. .  Diagnoses and all orders for this visit:  Other iron deficiency anemia -     CBC with Differential -     CMP14+EGFR -     Iron, TIBC and Ferritin Panel  Annual physical exam Completed  Fluctuation of weight -     TSH + free T4    Counseled on healthy lifestyle choices, including diet (rich in fruits, vegetables and lean meats and low in salt and simple carbohydrates) and exercise (at least 30 minutes of moderate physical activity daily).  Patient to follow up in 1 year for annual exam or sooner if needed.  The above assessment and management plan was discussed with the patient. The patient verbalized understanding of and has agreed to the management plan. Patient is aware to call the clinic if symptoms persist or worsen. Patient is aware when to return to the clinic for a follow-up visit. Patient educated on when it is appropriate to go to the emergency department.   This note has been created with Surveyor, quantity. Any transcriptional errors are unintentional.   Tara Perna, NP 04/02/2022, 3:09 PM

## 2022-04-03 LAB — CMP14+EGFR
ALT: 9 IU/L (ref 0–32)
AST: 14 IU/L (ref 0–40)
Albumin/Globulin Ratio: 2 (ref 1.2–2.2)
Albumin: 4.5 g/dL (ref 3.9–4.9)
Alkaline Phosphatase: 52 IU/L (ref 44–121)
BUN/Creatinine Ratio: 19 (ref 9–23)
BUN: 11 mg/dL (ref 6–20)
Bilirubin Total: 0.4 mg/dL (ref 0.0–1.2)
CO2: 24 mmol/L (ref 20–29)
Calcium: 9.4 mg/dL (ref 8.7–10.2)
Chloride: 104 mmol/L (ref 96–106)
Creatinine, Ser: 0.58 mg/dL (ref 0.57–1.00)
Globulin, Total: 2.2 g/dL (ref 1.5–4.5)
Glucose: 67 mg/dL — ABNORMAL LOW (ref 70–99)
Potassium: 4.2 mmol/L (ref 3.5–5.2)
Sodium: 140 mmol/L (ref 134–144)
Total Protein: 6.7 g/dL (ref 6.0–8.5)
eGFR: 121 mL/min/{1.73_m2} (ref >59)

## 2022-04-03 LAB — IRON,TIBC AND FERRITIN PANEL
Ferritin: 25 ng/mL (ref 15–150)
Iron Saturation: 25 % (ref 15–55)
Iron: 84 ug/dL (ref 27–159)
Total Iron Binding Capacity: 332 ug/dL (ref 250–450)
UIBC: 248 ug/dL (ref 131–425)

## 2022-04-03 LAB — CBC WITH DIFFERENTIAL/PLATELET
Basophils Absolute: 0 10*3/uL (ref 0.0–0.2)
Basos: 1 %
EOS (ABSOLUTE): 0.1 10*3/uL (ref 0.0–0.4)
Eos: 2 %
Hematocrit: 32.9 % — ABNORMAL LOW (ref 34.0–46.6)
Hemoglobin: 10.8 g/dL — ABNORMAL LOW (ref 11.1–15.9)
Immature Grans (Abs): 0 10*3/uL (ref 0.0–0.1)
Immature Granulocytes: 0 %
Lymphocytes Absolute: 1.8 10*3/uL (ref 0.7–3.1)
Lymphs: 33 %
MCH: 31.3 pg (ref 26.6–33.0)
MCHC: 32.8 g/dL (ref 31.5–35.7)
MCV: 95 fL (ref 79–97)
Monocytes Absolute: 0.3 10*3/uL (ref 0.1–0.9)
Monocytes: 6 %
Neutrophils Absolute: 3.2 10*3/uL (ref 1.4–7.0)
Neutrophils: 58 %
Platelets: 306 10*3/uL (ref 150–450)
RBC: 3.45 x10E6/uL — ABNORMAL LOW (ref 3.77–5.28)
RDW: 11.6 % — ABNORMAL LOW (ref 11.7–15.4)
WBC: 5.4 10*3/uL (ref 3.4–10.8)

## 2022-04-03 LAB — TSH+FREE T4
Free T4: 0.85 ng/dL (ref 0.82–1.77)
TSH: 2.52 u[IU]/mL (ref 0.450–4.500)

## 2022-04-18 ENCOUNTER — Ambulatory Visit (INDEPENDENT_AMBULATORY_CARE_PROVIDER_SITE_OTHER): Payer: Medicaid Other | Admitting: Primary Care

## 2022-04-18 ENCOUNTER — Ambulatory Visit (INDEPENDENT_AMBULATORY_CARE_PROVIDER_SITE_OTHER): Payer: Medicaid Other

## 2022-04-18 DIAGNOSIS — Z23 Encounter for immunization: Secondary | ICD-10-CM | POA: Diagnosis not present

## 2022-05-13 ENCOUNTER — Other Ambulatory Visit: Payer: Self-pay

## 2022-05-13 ENCOUNTER — Encounter (HOSPITAL_COMMUNITY): Payer: Self-pay

## 2022-05-13 ENCOUNTER — Emergency Department (HOSPITAL_COMMUNITY)
Admission: EM | Admit: 2022-05-13 | Discharge: 2022-05-14 | Discharge status: Against Medical Advice | Payer: Medicaid Other | Attending: Emergency Medicine | Admitting: Emergency Medicine

## 2022-05-13 DIAGNOSIS — Z5321 Procedure and treatment not carried out due to patient leaving prior to being seen by health care provider: Secondary | ICD-10-CM | POA: Diagnosis not present

## 2022-05-13 DIAGNOSIS — R197 Diarrhea, unspecified: Secondary | ICD-10-CM | POA: Diagnosis not present

## 2022-05-13 DIAGNOSIS — R1084 Generalized abdominal pain: Secondary | ICD-10-CM | POA: Insufficient documentation

## 2022-05-13 DIAGNOSIS — R110 Nausea: Secondary | ICD-10-CM | POA: Diagnosis not present

## 2022-05-13 LAB — CBC
HCT: 34.7 % — ABNORMAL LOW (ref 36.0–46.0)
Hemoglobin: 11.5 g/dL — ABNORMAL LOW (ref 12.0–15.0)
MCH: 31.3 pg (ref 26.0–34.0)
MCHC: 33.1 g/dL (ref 30.0–36.0)
MCV: 94.6 fL (ref 80.0–100.0)
Platelets: 293 10*3/uL (ref 150–400)
RBC: 3.67 MIL/uL — ABNORMAL LOW (ref 3.87–5.11)
RDW: 12.1 % (ref 11.5–15.5)
WBC: 3.8 10*3/uL — ABNORMAL LOW (ref 4.0–10.5)
nRBC: 0 % (ref 0.0–0.2)

## 2022-05-13 LAB — URINALYSIS, ROUTINE W REFLEX MICROSCOPIC
Bilirubin Urine: NEGATIVE
Glucose, UA: NEGATIVE mg/dL
Hgb urine dipstick: NEGATIVE
Ketones, ur: NEGATIVE mg/dL
Leukocytes,Ua: NEGATIVE
Nitrite: NEGATIVE
Protein, ur: NEGATIVE mg/dL
Specific Gravity, Urine: 1.025 (ref 1.005–1.030)
pH: 6 (ref 5.0–8.0)

## 2022-05-13 LAB — COMPREHENSIVE METABOLIC PANEL
ALT: 13 U/L (ref 0–44)
AST: 17 U/L (ref 15–41)
Albumin: 3.8 g/dL (ref 3.5–5.0)
Alkaline Phosphatase: 50 U/L (ref 38–126)
Anion gap: 5 (ref 5–15)
BUN: 17 mg/dL (ref 6–20)
CO2: 25 mmol/L (ref 22–32)
Calcium: 8.6 mg/dL — ABNORMAL LOW (ref 8.9–10.3)
Chloride: 109 mmol/L (ref 98–111)
Creatinine, Ser: 0.54 mg/dL (ref 0.44–1.00)
GFR, Estimated: >60 mL/min (ref >60)
Glucose, Bld: 87 mg/dL (ref 70–99)
Potassium: 3.5 mmol/L (ref 3.5–5.1)
Sodium: 139 mmol/L (ref 135–145)
Total Bilirubin: 0.5 mg/dL (ref 0.3–1.2)
Total Protein: 6.5 g/dL (ref 6.5–8.1)

## 2022-05-13 LAB — I-STAT BETA HCG BLOOD, ED (MC, WL, AP ONLY): I-stat hCG, quantitative: <5 m[IU]/mL (ref <5)

## 2022-05-13 LAB — LIPASE, BLOOD: Lipase: 31 U/L (ref 11–51)

## 2022-05-13 NOTE — ED Provider Triage Note (Signed)
Emergency Medicine Provider Triage Evaluation Note  Tara [name] , a 36 y.o. female  was evaluated in triage.  Pt complains of generalized abd pain since yesterday. Some nausea, no vomiting, did diarrhea.   No fevers.   No CP or SOB. No abd surgeries (apart from tubal ligation).   Review of Systems  Positive: Abd pain Negative: Fever  Physical Exam  BP 101/67   Pulse 86   Temp 98.7 F (37.1 C)   Resp 16   Ht 5\' 2"  (1.575 m)   Wt 53.1 kg   LMP 05/05/2022 (Exact Date)   SpO2 98%   BMI 21.40 kg/m  Gen:   Awake, no distress   Resp:  Normal effort  MSK:   Moves extremities without difficulty  Other:  Abd soft, mild TTP of abd. No focal abd TTP. No guarding orrebound.   Medical Decision Making  Medically screening exam initiated at 1:28 PM.  Appropriate orders placed.  Tara [name] was informed that the remainder of the evaluation will be completed by another provider, this initial triage assessment does not replace that evaluation, and the importance of remaining in the ED until their evaluation is complete.  [address]   Tara [name] [place], [place] 05/13/22 1330

## 2022-05-13 NOTE — ED Triage Notes (Signed)
Pt states that she is having generalized abd pain and diarrhea since eating fast food yesterday.

## 2022-09-12 ENCOUNTER — Other Ambulatory Visit (HOSPITAL_COMMUNITY)
Admission: RE | Admit: 2022-09-12 | Discharge: 2022-09-12 | Disposition: A | Discharge status: Home / Self Care | Payer: Medicaid Other | Source: Ambulatory Visit | Attending: Primary Care | Admitting: Primary Care

## 2022-09-12 ENCOUNTER — Ambulatory Visit (INDEPENDENT_AMBULATORY_CARE_PROVIDER_SITE_OTHER): Payer: Medicaid Other | Admitting: Primary Care

## 2022-09-12 ENCOUNTER — Encounter (INDEPENDENT_AMBULATORY_CARE_PROVIDER_SITE_OTHER): Payer: Self-pay | Admitting: Primary Care

## 2022-09-12 VITALS — BP 105/69 | HR 84 | Resp 16 | Wt 117.6 lb

## 2022-09-12 DIAGNOSIS — R87629 Unspecified abnormal cytological findings in specimens from vagina: Secondary | ICD-10-CM

## 2022-09-12 DIAGNOSIS — Z23 Encounter for immunization: Secondary | ICD-10-CM

## 2022-09-15 NOTE — Progress Notes (Signed)
[facility]   Subjective:   Tara [name] is a 37 y.o. [phone] female complains of an abnormal vaginal discharge for 5 days. Discharge described as: white, thick, and malodorous. Vaginal symptoms include local irritation and vulvar itching.Vulvar symptoms include local irritation.STI Risk: Very low risk of STD exposure.   Other associated symptoms: discharge described as white, curd-like, and malodorous.Menstrual pattern: Gynecologic History No LMP recorded. Obstetric History OB History  Gravida Para Term Preterm AB Living  '4 2 2   2 2  '$ SAB IAB Ectopic Multiple Live Births    2     2    # Outcome Date GA Lbr Len/2nd Weight Sex Delivery Anes PTL Lv  4 Term 10/27/11 [redacted]w[redacted]d/ 01:42 5 lb 15.6 oz (2.71 kg) F Vag-Spont EPI  LIV     Birth Comments: Normal newborn  3 Term 2009   6 lb 11 oz (3.033 kg) M   N LIV  2 IAB 2008          1 IAB             Past Medical History:  Diagnosis Date   Headache(784.0)    Heart murmur    as baby   History of chlamydia    History of gonorrhea    Preterm labor    Urinary tract infection     Past Surgical History:  Procedure Laterality Date   DILATION AND CURETTAGE OF UTERUS     INDUCED ABORTION     LAPAROSCOPIC TUBAL LIGATION  01/09/2012   Procedure: LAPAROSCOPIC TUBAL LIGATION;  Surgeon: BFrederico Hamman MD;  [facility];  Service: Gynecology;  Laterality: Bilateral;   ORIF WRIST FRACTURE Left 01/15/2014   Procedure: OPEN REDUCTION INTERNAL FIXATION (ORIF) WRIST FRACTURE;  Surgeon: FLinna Hoff MD;  Location[facility]  Service: Orthopedics;  Laterality: Left;   TUBAL LIGATION      Current Outpatient Medications on File Prior to Visit  Medication Sig Dispense Refill   ibuprofen (ADVIL) 400 MG tablet Take 1 tablet (400 mg total) by mouth every 8 (eight) hours as needed. 90 tablet 1   No current facility-administered medications on file prior to visit.    No Known Allergies  Social History   Socioeconomic  History   Marital status: Significant Other    Spouse name: Not on file   Number of children: Not on file   Years of education: Not on file   Highest education level: Not on file  Occupational History   Not on file  Tobacco Use   Smoking status: Former    Packs/day: 0.25    Types: Cigarettes   Smokeless tobacco: Never  Substance and Sexual Activity   Alcohol use: Not Currently   Drug use: No   Sexual activity: Yes    Birth control/protection: Surgical  Other Topics Concern   Not on file  Social History Narrative   Not on file   Social Determinants of Health   Financial Resource Strain: Not on file  Food Insecurity: Not on file  Transportation Needs: Not on file  Physical Activity: Not on file  Stress: Not on file  Social Connections: Not on file  Intimate Partner Violence: Not on file    Family History  Problem Relation Age of Onset   Breast cancer Paternal Aunt        in 20's or 30's   Anesthesia problems Neg Hx    Hearing loss Neg Hx  The following portions of the patient's history were reviewed and updated as appropriate: allergies, current medications, past family history, past medical history, past social history, past surgical history and problem list.  Review of Systems Pertinent items are noted in HPI.   Objective:  Blood Pressure 105/69   Pulse 84   Respiration 16   Weight 117 lb 9.6 oz (53.3 kg)   Oxygen Saturation 100%   Body Mass Index 21.51 kg/m  CONSTITUTIONAL: Well-developed, well-nourished female in no acute distress.  HENT:  Normocephalic, atraumatic, External right and left ear normal. Oropharynx is clear and moist EYES: Conjunctivae and EOM are normal. Pupils are equal, round, and reactive to light. No scleral icterus.  NECK: Normal range of motion, supple, no masses.  Normal thyroid.  SKIN: Skin is warm and dry. No rash noted. Not diaphoretic. No erythema. No pallor. [place]: Alert and oriented to person, place, and time. Normal  reflexes, muscle tone coordination. No cranial nerve deficit noted. PSYCHIATRIC: Normal mood and affect. Normal behavior. Normal judgment and thought content. CARDIOVASCULAR: Normal heart rate noted, regular rhythm RESPIRATORY: Clear to auscultation bilaterally. Effort and breath sounds normal, no problems with respiration noted. BREASTS: Symmetric in size. No masses, skin changes, nipple drainage, or lymphadenopathy. ABDOMEN: Soft, normal bowel sounds, no distention noted.  No tenderness, rebound or guarding.  PELVIC: Normal appearing external genitalia; normal appearing vaginal mucosa and cervix.  discharge noted.  Pap smear obtained.  Normal uterine size, no other palpable masses, no uterine or adnexal tenderness. MUSCULOSKELETAL: Normal range of motion. No tenderness.  No cyanosis, clubbing, or edema.  2+ distal pulses.  Assessment:  Tara [name] was seen today for gynecologic exam.  Diagnoses and all orders for this visit:  Abnormal vaginal Pap smear -     Cervicovaginal ancillary only -     Cytology - PAP  Other orders -     HPV 9-valent vaccine,Recombinat     If tests results are positive, please abstain from sexual activity until you and your partner(s) have been treated Return precautions given to come here or go to ER if you have any new or worsening symptoms fever, chills, nausea, vomiting, abdominal or pelvic pain, painful intercourse, vaginal discharge, vaginal bleeding, persistent symptoms despite treatment, etc...  Reviewed expectations re: course of current medical issues. Questions answered. Outlined signs and symptoms indicating need for more acute intervention. Patient verbalized understanding. After Visit Summary given. Will follow up on all labs ordered today.  Patient counseled regarding condom use with each sexual activity to promote wellness and prevention of transmission of HIV, syphilis, herpes simplex virus, gonorrhea, chlamydia and trichomoniasis.   This note  has been created with Surveyor, quantity. Any transcriptional errors are unintentional.   Kerin Perna, NP 09/15/2022, 8:44 PM

## 2022-09-16 ENCOUNTER — Encounter (INDEPENDENT_AMBULATORY_CARE_PROVIDER_SITE_OTHER): Payer: Self-pay | Admitting: Primary Care

## 2022-09-16 ENCOUNTER — Other Ambulatory Visit (INDEPENDENT_AMBULATORY_CARE_PROVIDER_SITE_OTHER): Payer: Self-pay | Admitting: Primary Care

## 2022-09-16 LAB — CERVICOVAGINAL ANCILLARY ONLY
Bacterial Vaginitis (gardnerella): POSITIVE — AB
Candida Glabrata: NEGATIVE
Candida Vaginitis: NEGATIVE
Chlamydia: NEGATIVE
Comment: NEGATIVE
Comment: NEGATIVE
Comment: NEGATIVE
Comment: NEGATIVE
Comment: NEGATIVE
Comment: NORMAL
Neisseria Gonorrhea: NEGATIVE
Trichomonas: POSITIVE — AB

## 2022-09-16 MED ORDER — METRONIDAZOLE 500 MG PO TABS: 500.0000 mg | ORAL_TABLET | Freq: Two times a day (BID) | ORAL | 0 refills | Status: DC

## 2022-09-17 ENCOUNTER — Other Ambulatory Visit (INDEPENDENT_AMBULATORY_CARE_PROVIDER_SITE_OTHER): Payer: Self-pay | Admitting: Primary Care

## 2022-09-17 NOTE — Telephone Encounter (Signed)
Requested medication (s) are due for refill today - no  Requested medication (s) are on the active medication list -yes  Future visit scheduled -no  Last refill: 09/16/22 #14  Notes to clinic: duplicate request- filled yesterday- off protocol medication  Requested Prescriptions  Pending Prescriptions Disp Refills   metroNIDAZOLE (FLAGYL) 500 MG tablet [Pharmacy Med Name: METRONIDAZOLE '500MG'$  TABLETS] 14 tablet 0    Sig: TAKE 1 TABLET(500 MG) BY MOUTH TWICE DAILY     Off-Protocol Failed - 09/16/2022  6:55 PM      Failed - Medication not assigned to a protocol, review manually.      Passed - Valid encounter within last 12 months    Recent Outpatient Visits           5 days ago Abnormal vaginal Pap smear   [place], Michelle P, NP   5 months ago Other iron deficiency anemia   [place] [facility] Kerin Perna, NP   6 months ago Need for HPV vaccine   [place] [facility] Kerin Perna, NP   7 months ago Need for Tdap vaccination   [place] [facility] Kerin Perna, NP   1 year ago Screening for STD (sexually transmitted disease)   [place] Kerin Perna, NP                 Requested Prescriptions  Pending Prescriptions Disp Refills   metroNIDAZOLE (FLAGYL) 500 MG tablet [Pharmacy Med Name: METRONIDAZOLE '500MG'$  TABLETS] 14 tablet 0    Sig: TAKE 1 TABLET(500 MG) BY MOUTH TWICE DAILY     Off-Protocol Failed - 09/16/2022  6:55 PM      Failed - Medication not assigned to a protocol, review manually.      Passed - Valid encounter within last 12 months    Recent Outpatient Visits           5 days ago Abnormal vaginal Pap smear   [place], Michelle P, NP   5 months ago Other iron deficiency anemia   [place] [facility] Kerin Perna, NP   6 months ago Need  for HPV vaccine   [place] [facility] Kerin Perna, NP   7 months ago Need for Tdap vaccination   [place] [facility] Kerin Perna, NP   1 year ago Screening for STD (sexually transmitted disease)   [place] Kerin Perna, NP

## 2022-09-18 LAB — CYTOLOGY - PAP
Comment: NEGATIVE
Diagnosis: UNDETERMINED — AB
High risk HPV: NEGATIVE

## 2022-09-20 ENCOUNTER — Other Ambulatory Visit (INDEPENDENT_AMBULATORY_CARE_PROVIDER_SITE_OTHER): Payer: Self-pay | Admitting: Primary Care

## 2022-09-20 DIAGNOSIS — R87629 Unspecified abnormal cytological findings in specimens from vagina: Secondary | ICD-10-CM

## 2022-10-13 ENCOUNTER — Emergency Department (HOSPITAL_COMMUNITY): Payer: 59

## 2022-10-13 ENCOUNTER — Inpatient Hospital Stay (HOSPITAL_COMMUNITY)
Admission: EM | Admit: 2022-10-13 | Discharge: 2022-10-15 | DRG: 191 | Disposition: A | Discharge status: Home / Self Care | Payer: 59 | Attending: Internal Medicine | Admitting: Internal Medicine

## 2022-10-13 ENCOUNTER — Ambulatory Visit (HOSPITAL_COMMUNITY)
Admission: EM | Admit: 2022-10-13 | Discharge: 2022-10-13 | Disposition: A | Discharge status: Home / Self Care | Payer: Medicaid Other

## 2022-10-13 ENCOUNTER — Encounter (HOSPITAL_COMMUNITY): Payer: Self-pay

## 2022-10-13 ENCOUNTER — Other Ambulatory Visit: Payer: Self-pay

## 2022-10-13 DIAGNOSIS — M25512 Pain in left shoulder: Secondary | ICD-10-CM | POA: Diagnosis present

## 2022-10-13 DIAGNOSIS — J438 Other emphysema: Principal | ICD-10-CM | POA: Diagnosis present

## 2022-10-13 DIAGNOSIS — R000 Tachycardia, unspecified: Secondary | ICD-10-CM

## 2022-10-13 DIAGNOSIS — R0600 Dyspnea, unspecified: Secondary | ICD-10-CM | POA: Diagnosis not present

## 2022-10-13 DIAGNOSIS — R079 Chest pain, unspecified: Secondary | ICD-10-CM

## 2022-10-13 DIAGNOSIS — I3139 Other pericardial effusion (noninflammatory): Secondary | ICD-10-CM | POA: Diagnosis not present

## 2022-10-13 DIAGNOSIS — J439 Emphysema, unspecified: Secondary | ICD-10-CM | POA: Diagnosis not present

## 2022-10-13 DIAGNOSIS — R002 Palpitations: Principal | ICD-10-CM | POA: Diagnosis present

## 2022-10-13 DIAGNOSIS — F17210 Nicotine dependence, cigarettes, uncomplicated: Secondary | ICD-10-CM | POA: Diagnosis present

## 2022-10-13 DIAGNOSIS — E876 Hypokalemia: Secondary | ICD-10-CM | POA: Diagnosis present

## 2022-10-13 DIAGNOSIS — Z9851 Tubal ligation status: Secondary | ICD-10-CM

## 2022-10-13 DIAGNOSIS — J984 Other disorders of lung: Secondary | ICD-10-CM

## 2022-10-13 DIAGNOSIS — R0602 Shortness of breath: Secondary | ICD-10-CM

## 2022-10-13 DIAGNOSIS — I4892 Unspecified atrial flutter: Secondary | ICD-10-CM | POA: Diagnosis present

## 2022-10-13 DIAGNOSIS — R9389 Abnormal findings on diagnostic imaging of other specified body structures: Secondary | ICD-10-CM

## 2022-10-13 DIAGNOSIS — R911 Solitary pulmonary nodule: Secondary | ICD-10-CM | POA: Diagnosis present

## 2022-10-13 DIAGNOSIS — R0989 Other specified symptoms and signs involving the circulatory and respiratory systems: Secondary | ICD-10-CM | POA: Diagnosis present

## 2022-10-13 DIAGNOSIS — Z79899 Other long term (current) drug therapy: Secondary | ICD-10-CM

## 2022-10-13 LAB — URINALYSIS, ROUTINE W REFLEX MICROSCOPIC
Bilirubin Urine: NEGATIVE
Glucose, UA: NEGATIVE mg/dL
Hgb urine dipstick: NEGATIVE
Ketones, ur: 5 mg/dL — AB
Leukocytes,Ua: NEGATIVE
Nitrite: NEGATIVE
Protein, ur: NEGATIVE mg/dL
Specific Gravity, Urine: 1.005 (ref 1.005–1.030)
pH: 7 (ref 5.0–8.0)

## 2022-10-13 LAB — COMPREHENSIVE METABOLIC PANEL
ALT: 11 U/L (ref 0–44)
AST: 21 U/L (ref 15–41)
Albumin: 3.9 g/dL (ref 3.5–5.0)
Alkaline Phosphatase: 50 U/L (ref 38–126)
Anion gap: 12 (ref 5–15)
BUN: 8 mg/dL (ref 6–20)
CO2: 23 mmol/L (ref 22–32)
Calcium: 8.8 mg/dL — ABNORMAL LOW (ref 8.9–10.3)
Chloride: 102 mmol/L (ref 98–111)
Creatinine, Ser: 0.56 mg/dL (ref 0.44–1.00)
GFR, Estimated: >60 mL/min (ref >60)
Glucose, Bld: 84 mg/dL (ref 70–99)
Potassium: 3.3 mmol/L — ABNORMAL LOW (ref 3.5–5.1)
Sodium: 137 mmol/L (ref 135–145)
Total Bilirubin: 0.8 mg/dL (ref 0.3–1.2)
Total Protein: 6.9 g/dL (ref 6.5–8.1)

## 2022-10-13 LAB — CBC WITH DIFFERENTIAL/PLATELET
Abs Immature Granulocytes: 0.01 10*3/uL (ref 0.00–0.07)
Basophils Absolute: 0 10*3/uL (ref 0.0–0.1)
Basophils Relative: 1 %
Eosinophils Absolute: 0.1 10*3/uL (ref 0.0–0.5)
Eosinophils Relative: 1 %
HCT: 39.3 % (ref 36.0–46.0)
Hemoglobin: 13.3 g/dL (ref 12.0–15.0)
Immature Granulocytes: 0 %
Lymphocytes Relative: 34 %
Lymphs Abs: 2.5 10*3/uL (ref 0.7–4.0)
MCH: 31.5 pg (ref 26.0–34.0)
MCHC: 33.8 g/dL (ref 30.0–36.0)
MCV: 93.1 fL (ref 80.0–100.0)
Monocytes Absolute: 0.5 10*3/uL (ref 0.1–1.0)
Monocytes Relative: 7 %
Neutro Abs: 4.3 10*3/uL (ref 1.7–7.7)
Neutrophils Relative %: 57 %
Platelets: 359 10*3/uL (ref 150–400)
RBC: 4.22 MIL/uL (ref 3.87–5.11)
RDW: 12.3 % (ref 11.5–15.5)
WBC: 7.3 10*3/uL (ref 4.0–10.5)
nRBC: 0 % (ref 0.0–0.2)

## 2022-10-13 LAB — TROPONIN I (HIGH SENSITIVITY): Troponin I (High Sensitivity): <2 ng/L (ref <18)

## 2022-10-13 LAB — TSH: TSH: 4.392 u[IU]/mL (ref 0.350–4.500)

## 2022-10-13 LAB — HCG, QUANTITATIVE, PREGNANCY: hCG, Beta Chain, Quant, S: <1 m[IU]/mL (ref <5)

## 2022-10-13 LAB — D-DIMER, QUANTITATIVE: D-Dimer, Quant: <0.27 ug/mL-FEU (ref 0.00–0.50)

## 2022-10-13 MED ORDER — LACTATED RINGERS IV BOLUS
1000.0000 mL | Freq: Once | INTRAVENOUS | Status: AC
  Administered 2022-10-13: 1000 mL via INTRAVENOUS

## 2022-10-13 MED ORDER — POTASSIUM CHLORIDE CRYS ER 20 MEQ PO TBCR
20.0000 meq | EXTENDED_RELEASE_TABLET | Freq: Once | ORAL | Status: AC
  Administered 2022-10-13: 20 meq via ORAL
  Filled 2022-10-13: qty 1

## 2022-10-13 MED ORDER — IOPAMIDOL (ISOVUE-370) INJECTION 76%
75.0000 mL | Freq: Once | INTRAVENOUS | Status: AC | PRN
  Administered 2022-10-13: 75 mL via INTRAVENOUS

## 2022-10-13 MED ORDER — ENOXAPARIN SODIUM 40 MG/0.4ML IJ SOSY
40.0000 mg | PREFILLED_SYRINGE | Freq: Every day | INTRAMUSCULAR | Status: DC
  Administered 2022-10-14 – 2022-10-15 (×2): 40 mg via SUBCUTANEOUS
  Filled 2022-10-13 (×2): qty 0.4

## 2022-10-13 MED ORDER — HYDROCODONE-ACETAMINOPHEN 5-325 MG PO TABS
1.0000 | ORAL_TABLET | Freq: Once | ORAL | Status: AC
  Administered 2022-10-13: 1 via ORAL
  Filled 2022-10-13: qty 1

## 2022-10-13 NOTE — ED Provider Notes (Signed)
Physical Exam  BP 97/67   Pulse (!) 127   Resp 19   Ht 5\' 2"  (1.575 m)   Wt 54 kg   LMP 09/20/2022 (Approximate)   SpO2 100%   BMI 21.77 kg/m   Physical Exam Constitutional:      General: She is not in acute distress.    Appearance: She is not toxic-appearing.  Cardiovascular:     Rate and Rhythm: Regular rhythm. Tachycardia present.  Pulmonary:     Effort: Pulmonary effort is normal. No respiratory distress.  Musculoskeletal:     Cervical back: Normal range of motion.  Skin:    General: Skin is warm and dry.  Neurological:     General: No focal deficit present.     Mental Status: She is alert. Mental status is at baseline.  Psychiatric:        Mood and Affect: Mood normal.     Procedures  Procedures  ED Course / MDM   Clinical Course as of 10/13/22 2127  [name] Oct 13, 2022  1818 Presents to the ER today complaining of fast heart rate.  She reports she has had this multiple times in the past but always resolved before the she is seen by her doctor. She reports history of heart murmur as a child.  [CB]  1823 TSH: 4.392 [CB]  1824 TSH [CB]  [id number] TSH: 4.392 [CB]    Clinical Course User Index [CB] [name], PA-C   Medical Decision Making Amount and/or Complexity of Data Reviewed Labs: ordered. Decision-making details documented in ED Course. Radiology: ordered.  Risk Prescription drug management. Decision regarding hospitalization.   Accepted handoff at shift change from [facility], [place]. Please see prior provider note for more detail.   Briefly: Patient is 37 y.o. F presents to the ER for evaluation of SOB and left shoulder pain with palpitations. This has been off and on "for her whole life", but worsened today around 0900. Reports that she has already seen a cardiologist about this as well.   DDX: concern for PE or intrathoracic abnormality  Plan: Follow-up on CT imaging  CT imaging abnormal.  Questionable cavitary lesions seen.  Patient  reports that she is up-to-date on all of her vaccinations.  She is not working communal housing or with special populations.  She has not been out of the country recently.  She denies any recent cough or cold symptoms as well.  I spoke with Dr. Patsey Berthold with critical care but mainly for pulmonology consultation, she will add the patient to the list tomorrow for possible bronchoscopy.  Does not recommend any antibiotics at this time.  Patient is persistently tachycardic even after fluid bolus.  Given her CT findings as well as her elevated heart rate, I do think she would benefit from admission to the hospital.  Sinus tachycardia.  EKG read by the attending.  Tried hospitalist to admit.  Patient is admenable to admission. Results for orders placed or performed during the hospital encounter of 10/13/22  Comprehensive metabolic panel  Result Value Ref Range   Sodium 137 135 - 145 mmol/L   Potassium 3.3 (L) 3.5 - 5.1 mmol/L   Chloride 102 98 - 111 mmol/L   CO2 23 22 - 32 mmol/L   Glucose, Bld 84 70 - 99 mg/dL   BUN 8 6 - 20 mg/dL   Creatinine, Ser 0.56 0.44 - 1.00 mg/dL   Calcium 8.8 (L) 8.9 - 10.3 mg/dL   Total Protein 6.9 6.5 -  8.1 g/dL   Albumin 3.9 3.5 - 5.0 g/dL   AST 21 15 - 41 U/L   ALT 11 0 - 44 U/L   Alkaline Phosphatase 50 38 - 126 U/L   Total Bilirubin 0.8 0.3 - 1.2 mg/dL   GFR, Estimated >60 >60 mL/min   Anion gap 12 5 - 15  CBC with Differential  Result Value Ref Range   WBC 7.3 4.0 - 10.5 K/uL   RBC 4.22 3.87 - 5.11 MIL/uL   Hemoglobin 13.3 12.0 - 15.0 g/dL   HCT 39.3 36.0 - 46.0 %   MCV 93.1 80.0 - 100.0 fL   MCH 31.5 26.0 - 34.0 pg   MCHC 33.8 30.0 - 36.0 g/dL   RDW 12.3 11.5 - 15.5 %   Platelets 359 150 - 400 K/uL   nRBC 0.0 0.0 - 0.2 %   Neutrophils Relative % 57 %   Neutro Abs 4.3 1.7 - 7.7 K/uL   Lymphocytes Relative 34 %   Lymphs Abs 2.5 0.7 - 4.0 K/uL   Monocytes Relative 7 %   Monocytes Absolute 0.5 0.1 - 1.0 K/uL   Eosinophils Relative 1 %    Eosinophils Absolute 0.1 0.0 - 0.5 K/uL   Basophils Relative 1 %   Basophils Absolute 0.0 0.0 - 0.1 K/uL   Immature Granulocytes 0 %   Abs Immature Granulocytes 0.01 0.00 - 0.07 K/uL  TSH  Result Value Ref Range   TSH 4.392 0.350 - 4.500 uIU/mL  Urinalysis, Routine w reflex microscopic -Urine, Clean Catch  Result Value Ref Range   Color, Urine STRAW (A) YELLOW   APPearance CLEAR CLEAR   Specific Gravity, Urine 1.005 1.005 - 1.030   pH 7.0 5.0 - 8.0   Glucose, UA NEGATIVE NEGATIVE mg/dL   Hgb urine dipstick NEGATIVE NEGATIVE   Bilirubin Urine NEGATIVE NEGATIVE   Ketones, ur 5 (A) NEGATIVE mg/dL   Protein, ur NEGATIVE NEGATIVE mg/dL   Nitrite NEGATIVE NEGATIVE   Leukocytes,Ua NEGATIVE NEGATIVE  hCG, quantitative, pregnancy  Result Value Ref Range   hCG, Beta Chain, Quant, S <1 <5 mIU/mL  D-dimer, quantitative  Result Value Ref Range   D-Dimer, Quant <0.27 0.00 - 0.50 ug/mL-FEU  Troponin I (High Sensitivity)  Result Value Ref Range   Troponin I (High Sensitivity) <2 <18 ng/L  Troponin I (High Sensitivity)  Result Value Ref Range   Troponin I (High Sensitivity) 3 <18 ng/L   CT Angio Chest PE W and/or Wo Contrast  Result Date: 10/13/2022 CLINICAL DATA:  Pulmonary embolism suspected. Low to intermediate probability. Negative D-dimer. Tachycardia and palpitations with shortness of breath. EXAM: CT ANGIOGRAPHY CHEST WITH CONTRAST TECHNIQUE: Multidetector CT imaging of the chest was performed using the standard protocol during bolus administration of intravenous contrast. Multiplanar CT image reconstructions and MIPs were obtained to evaluate the vascular anatomy. RADIATION DOSE REDUCTION: This exam was performed according to the departmental dose-optimization program which includes automated exposure control, adjustment of the mA and/or kV according to patient size and/or use of iterative reconstruction technique. CONTRAST:  15mL ISOVUE-370 IOPAMIDOL (ISOVUE-370) INJECTION 76% COMPARISON:   Portable chest today, chest radiograph 03/08/2016, abdomen and pelvis CT 09/28/2019. No prior chest CT. FINDINGS: Cardiovascular: The cardiac size is normal. There is a small pericardial effusion anteriorly. The pulmonary arteries and veins are normal caliber. No arterial embolism is seen. The aorta and great vessels are normal. Mediastinum/Nodes: No enlarged mediastinal, hilar, or axillary lymph nodes. The lower poles of the thyroid gland, trachea, and  esophagus demonstrate no significant findings. Lungs/Pleura: No pleural effusion, thickening or pneumothorax is seen. There are paraseptal emphysematous changes in the upper lobes including paramediastinal blebs anteriorly. Subpleural scar-like opacities are present in both apices. There is a 4 cm thin walled bulla in the right upper lobe apex. On the left, there are perihilar coarse interstitial and underlying cystic changes in the upper lobe mid perihilar area and in the superomedial aspect of the superior segment of the lower lobe. In the left upper lobe suprahilar area there is a 2.1 x 1.7 cm irregular cavitary focus containing a small amount of fluid, with the cavity measuring 2.1 x 1.7 cm on 6:55. A smaller cavitary lesion with irregular wall thickening in the left upper lobe just below this level measures 1.3 x 1 cm on 6:62. There are additional subpleural cystic changes and surrounding scarring in the posterior extreme left lower lobe base, and focally in the lateral basal segment of the right lower lobe, both seen previously. No confluent pneumonia or further parenchymal abnormality are noted. Upper Abdomen: No acute abnormality. Musculoskeletal: No chest wall abnormality. No acute or significant osseous findings. Review of the MIP images confirms the above findings. IMPRESSION: 1. No evidence of arterial dilatation or embolus. 2. Small pericardial effusion. 3. Upper lobe paraseptal emphysematous changes with 4 cm thin walled bulla in the right upper lobe  apex. 4. Left perihilar coarse interstitial and underlying cystic changes, with 2 irregular cavitary lesions in the left upper lobe, the larger measuring 2.1 x 1.7 cm with a small amount of fluid in the larger cavity. Findings are most likely due to an atypical infectious process, with neoplasm not completely excluded. Consider fungal infection or atypical mycobacterial disease. Other etiologies are possible. Three to six-month follow-up chest CT recommended. 5. Additional subpleural cystic changes and surrounding scarring in the posterior extreme left lower lobe base and focally in the lateral basal segment of the right lower lobe, both seen previously. Emphysema (ICD10-J43.9). Electronically Signed   By: Telford Nab M.D.   On: 10/13/2022 20:51   DG Chest Portable 1 View  Result Date: 10/13/2022 CLINICAL DATA:  Shortness of breath. EXAM: PORTABLE CHEST 1 VIEW COMPARISON:  Chest radiograph dated 03/08/2016. FINDINGS: The heart size and mediastinal contours are within normal limits. Both lungs are clear. The visualized skeletal structures are unremarkable. IMPRESSION: No active disease. Electronically Signed   By: Anner Crete M.D.   On: 10/13/2022 18:23         Sherrell Puller, PA-C 10/14/22 [id number]    Davonna Belling, MD 10/14/22 [phone]

## 2022-10-13 NOTE — ED Notes (Signed)
127 HR PT tachy

## 2022-10-13 NOTE — Discharge Instructions (Addendum)
Please go directly to the ER for further evaluation of the heart palpitations

## 2022-10-13 NOTE — ED Triage Notes (Signed)
Patient BIB [facility] from [facility] w/ palpitations x 5 hours.  [facility] administered 324 ASA PTA.  States this has happened before and saw a cardiologist and wore a holtor monitor but they were never able to catch any events.   HR in the 130-140s.

## 2022-10-13 NOTE — ED Triage Notes (Addendum)
Patient reports that she began having intermittent  palpitations since waking at 0900 today. HR- 141. Patient also c/o pain radiating down the left arm and SOB.

## 2022-10-13 NOTE — ED Provider Notes (Signed)
[facility] Provider Note   CSN: [id number] Arrival date & time: 10/13/22  1503     History  Chief Complaint  Patient presents with   Palpitations    Tara [name] is a 37 y.o. female.  History of iron deficiency anemia, presents ER complaining of fast heart rate that started suddenly today is going on for about 5 or 6 hours.  She went to urgent care was sent here due to tachycardia with some shortness of breath as well.  She had she had this happen several times in the past and had follow-up with cardiology but they were unable to ever capture of the episodes of fast heart rate on cardiac monitoring.  Denies fevers or chills, no weight loss, no heat or cold intolerance, no nausea or vomiting.  Denies alcohol or drug abuse.   Palpitations      Home Medications Prior to Admission medications   Medication Sig Start Date End Date Taking? Authorizing Provider  ibuprofen (ADVIL) 400 MG tablet Take 1 tablet (400 mg total) by mouth every 8 (eight) hours as needed. 08/28/21   Kerin Perna, NP  metroNIDAZOLE (FLAGYL) 500 MG tablet Take 1 tablet (500 mg total) by mouth 2 (two) times daily. 09/16/22   Kerin Perna, NP      Allergies    Patient has no known allergies.    Review of Systems   Review of Systems  Cardiovascular:  Positive for palpitations.    Physical Exam Updated Vital Signs BP 112/77   Pulse (!) 121   Resp (!) 21   Ht 5\' 2"  (1.575 m)   Wt 54 kg   LMP 09/20/2022 (Approximate)   SpO2 100%   BMI 21.77 kg/m  Physical Exam Vitals and nursing note reviewed.  Constitutional:      General: She is not in acute distress.    Appearance: She is well-developed.  HENT:     Head: Normocephalic and atraumatic.  Eyes:     Conjunctiva/sclera: Conjunctivae normal.  Cardiovascular:     Rate and Rhythm: Regular rhythm. Tachycardia present.     Heart sounds: No murmur heard. Pulmonary:     Effort: Pulmonary effort is  normal. No respiratory distress.     Breath sounds: Normal breath sounds. No wheezing or rales.  Abdominal:     Palpations: Abdomen is soft.     Tenderness: There is no abdominal tenderness.  Musculoskeletal:        General: No swelling.     Cervical back: Neck supple.  Skin:    General: Skin is warm and dry.     Capillary Refill: Capillary refill takes less than 2 seconds.  Neurological:     Mental Status: She is alert.  Psychiatric:        Mood and Affect: Mood normal.     ED Results / Procedures / Treatments   Labs (all labs ordered are listed, but only abnormal results are displayed) Labs Reviewed  COMPREHENSIVE METABOLIC PANEL  CBC WITH DIFFERENTIAL/PLATELET  TSH  URINALYSIS, ROUTINE W REFLEX MICROSCOPIC  RAPID URINE DRUG SCREEN, HOSP PERFORMED  D-DIMER, QUANTITATIVE  HCG, QUANTITATIVE, PREGNANCY  TROPONIN I (HIGH SENSITIVITY)    EKG None  Radiology No results found.  Procedures Procedures    Medications Ordered in ED Medications  lactated ringers bolus 1,000 mL (has no administration in time range)    ED Course/ Medical Decision Making/ A&P Clinical Course as of 10/13/22 1825  [name] Oct 13, 2022  1818 Presents to the ER today complaining of fast heart rate.  She reports she has had this multiple times in the past but always resolved before the she is seen by her doctor. She reports history of heart murmur as a child.  [CB]  1823 TSH: 4.392 [CB]  1824 TSH [CB]  [id number] TSH: 4.392 [CB]    Clinical Course User Index [CB] [name], PA-C                             Medical Decision Making This patient presents to the ED for concern of fast heart rate, this involves an extensive number of treatment options, and is a complaint that carries with it a high risk of complications and morbidity.  The differential diagnosis includes atrial fibrillation, sinus tachycardia, pulmonary embolism, thyroid storm, dehydration, substance abuse, sepsis,  other  Worse: Patient presents with recently high heart rate with some shoulder pain and shortness of breath ongoing today for about 5 hours prior to arrival.  She is very normal labs including a negative D-dimer but unfortunately was still persistently tachycardic despite fluid administration.  CT chest ordered for further evaluation given her ongoing symptoms.  This was read by the radiologist with findings of A small pericardial effusion and 2 irregular cavitary lesions and left upper lobe which is the location of the patient's pain.  Given this plan to discuss with pulmonology and likely admit for possible bronchoscopy and further evaluation.  Signed out at this time to oncoming team.  Amount and/or Complexity of Data Reviewed Labs: ordered. Decision-making details documented in ED Course. Radiology: ordered.  Risk Prescription drug management.           Final Clinical Impression(s) / ED Diagnoses Final diagnoses:  None    Rx / DC Orders ED Discharge Orders     None         [name], PA-C 10/13/22 2135    Deno Etienne, DO 10/14/22 1457

## 2022-10-13 NOTE — H&P (Incomplete)
History and Physical  [name] [id number] DOB: [birth year] DOA: 10/13/2022  Referring physician: Jolyne Loa  PCP: Kerin Perna, NP  Outpatient Specialists: None Patient coming from: Home  Chief Complaint: Palpitations and chest discomfort worse with deep breqth.  HPI: Tara [name] is a 37 y.o. female with medical history significant for intermittent palpitations since 2010 but worsened this morning.  They usually resolve spontaneously however since starting this morning they have been persistent.  Endorses history of congenital heart disease, not currently followed by cardiology.  States she can feel her heart race.  Associated with nonexertional dyspnea.  Concurrently she developed left-sided chest discomfort radiating to her left arm.  Denies recent use of tobacco, quit 4 years ago.  Denies cough or hemoptysis.  No weight loss Instat weight gain.  No incarceration, no traveling outside the [place].  Admits to chills and night sweats.  Her significant other convinced her to come to the ED for further evaluation.  In the ED, noted to be persistently tachycardic.  Due to concern for possible pulmonary embolism she had a CT angio chest which was negative for pulmonary embolism however it showed significant abnormalities, incidentally found, which include small pericardial effusion, upper lobe paraseptal emphysematous changes with 4 cm thin-walled bulla in the right upper lobe apex.  Left perihilar coarse interstitial and underlying cystic changes, with 2 irregular cavitary lesions in the left upper lobe, the larger measuring 2.1 x 1.7 cm with a small amount of fluid in the larger cavity.  Findings are more likely due to an atypical infectious process with neoplasm not completely excluded.  Consider fungal infection or atypical mycobacterial disease.  Other etiologies are possible.  3 to 6 months follow-up chest CT recommended.  Emphysema.  EDP discussed the case with pulmonology Dr.  Patsey Berthold, pulmonary will see in the morning.  [facility], hospitalist service, was asked to admit.  ED Course: Tmax 98.4.  BP 104/80, pulse 115, respiratory rate 18, O2 saturation 100% on room air.  Lab studies markable for serum potassium 3.3, high-sensitivity troponin less than 2.  CBC essentially unremarkable.   Review of Systems: Review of systems as noted in the HPI. All other systems reviewed and are negative.   Past Medical History:  Diagnosis Date   Headache(784.0)    Heart murmur    as baby   History of chlamydia    History of gonorrhea    Preterm labor    Urinary tract infection    Past Surgical History:  Procedure Laterality Date   DILATION AND CURETTAGE OF UTERUS     INDUCED ABORTION     LAPAROSCOPIC TUBAL LIGATION  01/09/2012   Procedure: LAPAROSCOPIC TUBAL LIGATION;  Surgeon: Frederico Hamman, MD;  Location: [facility];  Service: Gynecology;  Laterality: Bilateral;   ORIF WRIST FRACTURE Left 01/15/2014   Procedure: OPEN REDUCTION INTERNAL FIXATION (ORIF) WRIST FRACTURE;  Surgeon: Linna Hoff, MD;  Location: [facility];  Service: Orthopedics;  Laterality: Left;   TUBAL LIGATION      Social History:  reports that she has been smoking cigarettes. She has been smoking an average of .25 packs per day. She has never used smokeless tobacco. She reports that she does not currently use alcohol. She reports that she does not use drugs.   No Known Allergies  Family History  Problem Relation Age of Onset   Breast cancer Paternal Aunt        in 20's or 30's   Anesthesia problems Neg Hx  Hearing loss Neg Hx       Prior to Admission medications   Medication Sig Start Date End Date Taking? Authorizing Provider  ibuprofen (ADVIL) 400 MG tablet Take 1 tablet (400 mg total) by mouth every 8 (eight) hours as needed. 08/28/21   Kerin Perna, NP  metroNIDAZOLE (FLAGYL) 500 MG tablet Take 1 tablet (500 mg total) by mouth 2 (two) times daily. 09/16/22   Kerin Perna, NP     Physical Exam: BP 104/71   Pulse (!) 121   Temp (!) 97.3 F (36.3 C) (Oral)   Resp (!) 22   Ht 5\' 2"  (1.575 m)   Wt 54 kg   LMP 09/20/2022 (Approximate)   SpO2 100%   BMI 21.77 kg/m   General: 37 y.o. year-old female well developed well nourished in no acute distress.  Alert and oriented x3. Cardiovascular: Tachycardic with no rubs or gallops.  No thyromegaly or JVD noted.  No lower extremity edema. 2/4 pulses in all 4 extremities. Respiratory: Mild rales noted diffusely.  No wheezing noted.  Poor inspiratory effort. Abdomen: Soft nontender nondistended with normal bowel sounds x4 quadrants. Muskuloskeletal: No cyanosis, clubbing or edema noted bilaterally Neuro: CN II-XII intact, strength, sensation, reflexes Skin: No ulcerative lesions noted or rashes Psychiatry: Judgement and insight appear normal. Mood is appropriate for condition and setting          Labs on Admission:  Basic Metabolic Panel: Recent Labs  Lab 10/13/22 1617  NA 137  K 3.3*  CL 102  CO2 23  GLUCOSE 84  BUN 8  CREATININE 0.56  CALCIUM 8.8*   Liver Function Tests: Recent Labs  Lab 10/13/22 1617  AST 21  ALT 11  ALKPHOS 50  BILITOT 0.8  PROT 6.9  ALBUMIN 3.9   No results for input(s): "LIPASE", "AMYLASE" in the last 168 hours. No results for input(s): "AMMONIA" in the last 168 hours. CBC: Recent Labs  Lab 10/13/22 1617  WBC 7.3  NEUTROABS 4.3  HGB 13.3  HCT 39.3  MCV 93.1  PLT 359   Cardiac Enzymes: No results for input(s): "CKTOTAL", "CKMB", "CKMBINDEX", "TROPONINI" in the last 168 hours.  BNP (last 3 results) No results for input(s): "BNP" in the last 8760 hours.  ProBNP (last 3 results) No results for input(s): "PROBNP" in the last 8760 hours.  CBG: No results for input(s): "GLUCAP" in the last 168 hours.  Radiological Exams on Admission: CT Angio Chest PE W and/or Wo Contrast  Result Date: 10/13/2022 CLINICAL DATA:  Pulmonary embolism suspected. Low to  intermediate probability. Negative D-dimer. Tachycardia and palpitations with shortness of breath. EXAM: CT ANGIOGRAPHY CHEST WITH CONTRAST TECHNIQUE: Multidetector CT imaging of the chest was performed using the standard protocol during bolus administration of intravenous contrast. Multiplanar CT image reconstructions and MIPs were obtained to evaluate the vascular anatomy. RADIATION DOSE REDUCTION: This exam was performed according to the departmental dose-optimization program which includes automated exposure control, adjustment of the mA and/or kV according to patient size and/or use of iterative reconstruction technique. CONTRAST:  60mL ISOVUE-370 IOPAMIDOL (ISOVUE-370) INJECTION 76% COMPARISON:  Portable chest today, chest radiograph 03/08/2016, abdomen and pelvis CT 09/28/2019. No prior chest CT. FINDINGS: Cardiovascular: The cardiac size is normal. There is a small pericardial effusion anteriorly. The pulmonary arteries and veins are normal caliber. No arterial embolism is seen. The aorta and great vessels are normal. Mediastinum/Nodes: No enlarged mediastinal, hilar, or axillary lymph nodes. The lower poles of the thyroid gland, trachea, and  esophagus demonstrate no significant findings. Lungs/Pleura: No pleural effusion, thickening or pneumothorax is seen. There are paraseptal emphysematous changes in the upper lobes including paramediastinal blebs anteriorly. Subpleural scar-like opacities are present in both apices. There is a 4 cm thin walled bulla in the right upper lobe apex. On the left, there are perihilar coarse interstitial and underlying cystic changes in the upper lobe mid perihilar area and in the superomedial aspect of the superior segment of the lower lobe. In the left upper lobe suprahilar area there is a 2.1 x 1.7 cm irregular cavitary focus containing a small amount of fluid, with the cavity measuring 2.1 x 1.7 cm on 6:55. A smaller cavitary lesion with irregular wall thickening in the  left upper lobe just below this level measures 1.3 x 1 cm on 6:62. There are additional subpleural cystic changes and surrounding scarring in the posterior extreme left lower lobe base, and focally in the lateral basal segment of the right lower lobe, both seen previously. No confluent pneumonia or further parenchymal abnormality are noted. Upper Abdomen: No acute abnormality. Musculoskeletal: No chest wall abnormality. No acute or significant osseous findings. Review of the MIP images confirms the above findings. IMPRESSION: 1. No evidence of arterial dilatation or embolus. 2. Small pericardial effusion. 3. Upper lobe paraseptal emphysematous changes with 4 cm thin walled bulla in the right upper lobe apex. 4. Left perihilar coarse interstitial and underlying cystic changes, with 2 irregular cavitary lesions in the left upper lobe, the larger measuring 2.1 x 1.7 cm with a small amount of fluid in the larger cavity. Findings are most likely due to an atypical infectious process, with neoplasm not completely excluded. Consider fungal infection or atypical mycobacterial disease. Other etiologies are possible. Three to six-month follow-up chest CT recommended. 5. Additional subpleural cystic changes and surrounding scarring in the posterior extreme left lower lobe base and focally in the lateral basal segment of the right lower lobe, both seen previously. Emphysema (ICD10-J43.9). Electronically Signed   By: Telford Nab M.D.   On: 10/13/2022 20:51   DG Chest Portable 1 View  Result Date: 10/13/2022 CLINICAL DATA:  Shortness of breath. EXAM: PORTABLE CHEST 1 VIEW COMPARISON:  Chest radiograph dated 03/08/2016. FINDINGS: The heart size and mediastinal contours are within normal limits. Both lungs are clear. The visualized skeletal structures are unremarkable. IMPRESSION: No active disease. Electronically Signed   By: Anner Crete M.D.   On: 10/13/2022 18:23    EKG: I independently viewed the EKG done and my  findings are as followed: Sinus tachycardia, rate of 127, nonspecific ST-T changes.  QTc 467.  Assessment/Plan Present on Admission:  Dyspnea  Principal Problem:   Dyspnea  Dyspnea, suspect underlying emphysema seen on CT scan Not hypoxic.  No cough reported.  No hemoptysis.  No history of incarceration or travel overseas. Admits to chills and night sweats. Emphysema incidentally seen on CT scan with bullae and cavitary lesions EDP consulted pulmonary who will see in consultation. Possible bronchoscopy on 10/14/2022 Start IV azithromycin for its anti-inflammatory and antibiotic properties with suspected underlying emphysema and possible atypical pulmonary infection Rest of management per pulmonary N.p.o. until seen by pulmonary  Persistent symptomatic sinus tachycardia with palpitations TSH within normal limit Self reported history of congenital heart defect Start p.o. Lopressor 12.5 mg twice daily Follow 2D echo Cardiology consulted  Chest discomfort, radiating to left upper extremity First set of high-sensitivity troponin less than 2, trended No evidence of acute ischemia on twelve-lead EKG Follow 2D  echo As needed analgesics.  Small pericardial effusion, seen on CT scan Euvolemic on exam. Follow 2D echo Hold off IV fluid for now, received 1 L IV fluid bolus in the ED 1 L LR x 1  Former tobacco user Quit tobacco use 4 years ago. Congratulated her for her accomplishment of tobacco cessation. Her significant other smokes and is working on tobacco cessation himself.  Hypokalemia Serum potassium 3.4 Repleted orally. Repeat BMP, obtain magnesium level in the morning.   DVT prophylaxis: Subcu Lovenox daily.  Code Status: Full code.  Family Communication: Significant other at bedside.  Disposition Plan: Admitted to telemetry cardiac unit.  Consults called: Cardiology consulted.  Admission status: Inpatient status.   Status is: Inpatient. The patient requires at  least 2 midnights for further evaluation and treatment of present condition.   Kayleen Memos MD [facility] Pager [phone]  If 7PM-7AM, please contact night-coverage [email or web address] Password [facility]  10/13/2022, 11:14 PM

## 2022-10-13 NOTE — ED Notes (Signed)
124 HR

## 2022-10-13 NOTE — ED Provider Notes (Signed)
[facility]    CSN: [id number] Arrival date & time: 10/13/22  1323      History   Chief Complaint Chief Complaint  Patient presents with   Palpitations    HPI Tara [name] is a 37 y.o. female.   Patient presents today for palpitations.  Reports she also feels short of breath, hard to talk, and chest pain.  Reports this began a couple hours ago and has persisted.  She reports a history of similar, about 2 weeks ago, 1 week ago, and now today.  Reports in the past, she has had to have her heart monitored at home with an at home monitor and not capture any abnormal heart rhythm.  Has not take anything for symptoms so far today.    Past Medical History:  Diagnosis Date   Headache(784.0)    Heart murmur    as baby   History of chlamydia    History of gonorrhea    Preterm labor    Urinary tract infection     Patient Active Problem List   Diagnosis Date Noted   Fracture of left distal radius 01/14/2014   Nausea and vomiting of pregnancy, antepartum 03/11/2011    Past Surgical History:  Procedure Laterality Date   DILATION AND CURETTAGE OF UTERUS     INDUCED ABORTION     LAPAROSCOPIC TUBAL LIGATION  01/09/2012   Procedure: LAPAROSCOPIC TUBAL LIGATION;  Surgeon: Frederico Hamman, MD;  Location: [facility];  Service: Gynecology;  Laterality: Bilateral;   ORIF WRIST FRACTURE Left 01/15/2014   Procedure: OPEN REDUCTION INTERNAL FIXATION (ORIF) WRIST FRACTURE;  Surgeon: Linna Hoff, MD;  Location: [facility];  Service: Orthopedics;  Laterality: Left;   TUBAL LIGATION      OB History     Gravida  4   Para  2   Term  2   Preterm      AB  2   Living  2      SAB      IAB  2   Ectopic      Multiple      Live Births  2            Home Medications    Prior to Admission medications   Medication Sig Start Date End Date Taking? Authorizing Provider  ibuprofen (ADVIL) 400 MG tablet Take 1 tablet (400 mg total) by mouth every 8 (eight) hours as  needed. 08/28/21   Kerin Perna, NP  metroNIDAZOLE (FLAGYL) 500 MG tablet Take 1 tablet (500 mg total) by mouth 2 (two) times daily. 09/16/22   Kerin Perna, NP    Family History Family History  Problem Relation Age of Onset   Breast cancer Paternal Aunt        in 20's or 30's   Anesthesia problems Neg Hx    Hearing loss Neg Hx     Social History Social History   Tobacco Use   Smoking status: Former    Packs/day: .25    Types: Cigarettes   Smokeless tobacco: Never  Substance Use Topics   Alcohol use: Not Currently   Drug use: No     Allergies   Patient has no known allergies.   Review of Systems Review of Systems Per HPI  Physical Exam Triage Vital Signs ED Triage Vitals  Enc Vitals Group     BP      Pulse      Resp      Temp  Temp src      SpO2      Weight      Height      Head Circumference      Peak Flow      Pain Score      Pain Loc      Pain Edu?      Excl. in [place]?    No data found.  Updated Vital Signs BP 123/83 (BP Location: Left Arm)   Pulse (!) 141   Temp 98.4 F (36.9 C) (Oral)   Resp 16   SpO2 98%   Visual Acuity Right Eye Distance:   Left Eye Distance:   Bilateral Distance:    Right Eye Near:   Left Eye Near:    Bilateral Near:     Physical Exam Vitals and nursing note reviewed.  Constitutional:      General: She is in acute distress.     Appearance: She is not ill-appearing or toxic-appearing.  Cardiovascular:     Rate and Rhythm: Tachycardia present.  Pulmonary:     Effort: Pulmonary effort is normal. No respiratory distress.  Skin:    General: Skin is warm and dry.     Coloration: Skin is not jaundiced or pale.     Findings: No erythema.  Neurological:     Mental Status: She is alert and oriented to person, place, and time.  Psychiatric:        Mood and Affect: Mood is anxious.        Behavior: Behavior is cooperative.      UC Treatments / Results  Labs (all labs ordered are listed, but only  abnormal results are displayed) Labs Reviewed - No data to display  EKG   Radiology No results found.  Procedures Procedures (including critical care time)  Medications Ordered in UC Medications - No data to display  Initial Impression / Assessment and Plan / UC Course  I have reviewed the triage vital signs and the nursing notes.  Pertinent labs & imaging results that were available during my care of the patient were reviewed by me and considered in my medical decision making (see chart for details).   In triage, patient is tachycardic, otherwise normotensive, afebrile, not tachypneic, oxygenating well on room air.  EKG confirms sinus tachycardia at 134 bpm.  1. Tachycardia 2. Chest pain, unspecified type 3. Shortness of breath Recommended further evaluation and management emergency room. Patient is agreeable to plan Recommended transportation via EMS Patient is in agreement to plan Intra venous access initiated [facility] called for transportation to emergency room Patient left urgent care in stable condition  Patient is well-appearing, normotensive, afebrile, not tachycardic, not tachypneic, oxygenating well on room air.   Final Clinical Impressions(s) / UC Diagnoses   Final diagnoses:  Tachycardia  Chest pain, unspecified type  Shortness of breath     Discharge Instructions      Please go directly to the ER for further evaluation of the heart palpitations     ED Prescriptions   None    PDMP not reviewed this encounter.   Eulogio Bear, NP 10/13/22 1444

## 2022-10-13 NOTE — ED Notes (Signed)
Notified [facility] of patient needing transport

## 2022-10-14 ENCOUNTER — Telehealth: Payer: Self-pay | Admitting: Student

## 2022-10-14 ENCOUNTER — Inpatient Hospital Stay (HOSPITAL_COMMUNITY): Payer: 59

## 2022-10-14 ENCOUNTER — Inpatient Hospital Stay (INDEPENDENT_AMBULATORY_CARE_PROVIDER_SITE_OTHER): Payer: 59

## 2022-10-14 ENCOUNTER — Other Ambulatory Visit: Payer: Self-pay | Admitting: Cardiology

## 2022-10-14 DIAGNOSIS — R002 Palpitations: Secondary | ICD-10-CM

## 2022-10-14 DIAGNOSIS — E876 Hypokalemia: Secondary | ICD-10-CM | POA: Diagnosis not present

## 2022-10-14 DIAGNOSIS — Z9851 Tubal ligation status: Secondary | ICD-10-CM | POA: Diagnosis not present

## 2022-10-14 DIAGNOSIS — I3139 Other pericardial effusion (noninflammatory): Secondary | ICD-10-CM | POA: Diagnosis not present

## 2022-10-14 DIAGNOSIS — R0989 Other specified symptoms and signs involving the circulatory and respiratory systems: Secondary | ICD-10-CM | POA: Diagnosis not present

## 2022-10-14 DIAGNOSIS — J984 Other disorders of lung: Secondary | ICD-10-CM

## 2022-10-14 DIAGNOSIS — M25512 Pain in left shoulder: Secondary | ICD-10-CM | POA: Diagnosis not present

## 2022-10-14 DIAGNOSIS — F17210 Nicotine dependence, cigarettes, uncomplicated: Secondary | ICD-10-CM | POA: Diagnosis not present

## 2022-10-14 DIAGNOSIS — Z79899 Other long term (current) drug therapy: Secondary | ICD-10-CM | POA: Diagnosis not present

## 2022-10-14 DIAGNOSIS — J438 Other emphysema: Secondary | ICD-10-CM | POA: Diagnosis not present

## 2022-10-14 DIAGNOSIS — R079 Chest pain, unspecified: Secondary | ICD-10-CM

## 2022-10-14 DIAGNOSIS — R911 Solitary pulmonary nodule: Secondary | ICD-10-CM | POA: Diagnosis not present

## 2022-10-14 DIAGNOSIS — I4892 Unspecified atrial flutter: Secondary | ICD-10-CM | POA: Diagnosis not present

## 2022-10-14 LAB — ECHOCARDIOGRAM COMPLETE
AR max vel: 1.99 cm2
AV Area VTI: 1.8 cm2
AV Area mean vel: 1.9 cm2
AV Mean grad: 2 mmHg
AV Peak grad: 4.6 mmHg
Ao pk vel: 1.07 m/s
Area-P 1/2: 3.77 cm2
Height: 62 in
S' Lateral: 3.2 cm
Weight: 1904 oz

## 2022-10-14 LAB — CBC
HCT: 31.6 % — ABNORMAL LOW (ref 36.0–46.0)
Hemoglobin: 10.5 g/dL — ABNORMAL LOW (ref 12.0–15.0)
MCH: 31.4 pg (ref 26.0–34.0)
MCHC: 33.2 g/dL (ref 30.0–36.0)
MCV: 94.6 fL (ref 80.0–100.0)
Platelets: 300 10*3/uL (ref 150–400)
RBC: 3.34 MIL/uL — ABNORMAL LOW (ref 3.87–5.11)
RDW: 12.6 % (ref 11.5–15.5)
WBC: 5.1 10*3/uL (ref 4.0–10.5)
nRBC: 0 % (ref 0.0–0.2)

## 2022-10-14 LAB — BASIC METABOLIC PANEL
Anion gap: 5 (ref 5–15)
BUN: 8 mg/dL (ref 6–20)
CO2: 23 mmol/L (ref 22–32)
Calcium: 8 mg/dL — ABNORMAL LOW (ref 8.9–10.3)
Chloride: 107 mmol/L (ref 98–111)
Creatinine, Ser: 0.58 mg/dL (ref 0.44–1.00)
GFR, Estimated: >60 mL/min (ref >60)
Glucose, Bld: 81 mg/dL (ref 70–99)
Potassium: 3.9 mmol/L (ref 3.5–5.1)
Sodium: 135 mmol/L (ref 135–145)

## 2022-10-14 LAB — MAGNESIUM: Magnesium: 1.8 mg/dL (ref 1.7–2.4)

## 2022-10-14 LAB — PHOSPHORUS: Phosphorus: 3.2 mg/dL (ref 2.5–4.6)

## 2022-10-14 LAB — TROPONIN I (HIGH SENSITIVITY): Troponin I (High Sensitivity): 3 ng/L (ref <18)

## 2022-10-14 MED ORDER — OXYCODONE HCL 5 MG PO TABS: 5.0000 mg | ORAL_TABLET | Freq: Four times a day (QID) | ORAL | Status: DC | PRN

## 2022-10-14 MED ORDER — POTASSIUM CHLORIDE CRYS ER 20 MEQ PO TBCR
20.0000 meq | EXTENDED_RELEASE_TABLET | Freq: Once | ORAL | Status: AC
  Administered 2022-10-14: 20 meq via ORAL
  Filled 2022-10-14: qty 1

## 2022-10-14 MED ORDER — SENNOSIDES-DOCUSATE SODIUM 8.6-50 MG PO TABS
1.0000 | ORAL_TABLET | Freq: Every day | ORAL | Status: DC
  Administered 2022-10-14: 1 via ORAL
  Filled 2022-10-14 (×2): qty 1

## 2022-10-14 MED ORDER — MORPHINE SULFATE (PF) 2 MG/ML IV SOLN: 2.0000 mg | INTRAVENOUS | Status: DC | PRN

## 2022-10-14 MED ORDER — MELATONIN 5 MG PO TABS: 5.0000 mg | ORAL_TABLET | Freq: Every evening | ORAL | Status: DC | PRN

## 2022-10-14 MED ORDER — METOPROLOL TARTRATE 12.5 MG HALF TABLET
12.5000 mg | ORAL_TABLET | Freq: Two times a day (BID) | ORAL | Status: DC
  Administered 2022-10-14 (×2): 12.5 mg via ORAL
  Filled 2022-10-14 (×3): qty 1

## 2022-10-14 MED ORDER — ACETAMINOPHEN 325 MG PO TABS: 650.0000 mg | ORAL_TABLET | Freq: Four times a day (QID) | ORAL | Status: DC | PRN

## 2022-10-14 MED ORDER — SODIUM CHLORIDE 0.9 % IV SOLN
500.0000 mg | Freq: Every day | INTRAVENOUS | Status: DC
  Administered 2022-10-14 (×2): 500 mg via INTRAVENOUS
  Filled 2022-10-14 (×2): qty 5

## 2022-10-14 MED ORDER — SODIUM CHLORIDE 0.9 % IV SOLN
3.0000 g | Freq: Four times a day (QID) | INTRAVENOUS | Status: DC
  Administered 2022-10-14 – 2022-10-15 (×4): 3 g via INTRAVENOUS
  Filled 2022-10-14 (×5): qty 8

## 2022-10-14 MED ORDER — PROCHLORPERAZINE EDISYLATE 10 MG/2ML IJ SOLN: 5.0000 mg | Freq: Four times a day (QID) | INTRAMUSCULAR | Status: DC | PRN

## 2022-10-14 MED ORDER — SODIUM CHLORIDE 0.9 % IV BOLUS
500.0000 mL | INTRAVENOUS | Status: AC
  Administered 2022-10-14: 500 mL via INTRAVENOUS

## 2022-10-14 NOTE — Progress Notes (Signed)
[facility] night cross cover note:   I was contacted by RN requesting clarification on whether or not to give this evening scheduled metoprolol tartrate 12.5 mg p.o. twice daily in the context of most recent blood pressure of 97/56, with current heart rates in the 80s to 90s.  It appears that this blood pressure is consistent with blood pressures throughout the day.  Will hold this next scheduled dose of metoprolol tartrate and closely monitor ensuing trend in blood pressure and heart rate.    Babs Bertin, DO Hospitalist

## 2022-10-14 NOTE — ED Notes (Signed)
Admitting MD notified of hypotension. See new orders.

## 2022-10-14 NOTE — Telephone Encounter (Signed)
Can we set up clinic visit in 8 weeks? I've ordered CT Chest to be done in 6 weeks  Thanks!

## 2022-10-14 NOTE — Progress Notes (Signed)
PROGRESS NOTE    Tara [name]  [id number] DOB: 1986/04/19 DOA: 10/13/2022 PCP: Kerin Perna, NP     Brief Narrative:  Tara [name] is a 37 y.o. female with medical history significant for intermittent palpitations since 2010 but worsened this morning.  They usually resolve spontaneously however since starting this morning they have been persistent.  Endorses history of congenital heart disease, not currently followed by cardiology.  States she can feel her heart race.  Associated with nonexertional dyspnea.  Concurrently she developed left-sided chest discomfort radiating to her left arm.  Denies recent use of tobacco, quit 4 years ago.  Denies cough or hemoptysis.  No weight loss Instat weight gain.  No incarceration, no traveling outside the [place].  Admits to chills and night sweats.  Her significant other convinced her to come to the ED for further evaluation.   In the ED, noted to be persistently tachycardic.  Due to concern for possible pulmonary embolism she had a CT angio chest which was negative for pulmonary embolism however it showed significant abnormalities, incidentally found, which include small pericardial effusion, upper lobe paraseptal emphysematous changes with 4 cm thin-walled bulla in the right upper lobe apex.  Left perihilar coarse interstitial and underlying cystic changes, with 2 irregular cavitary lesions in the left upper lobe, the larger measuring 2.1 x 1.7 cm with a small amount of fluid in the larger cavity.  Findings are more likely due to an atypical infectious process with neoplasm not completely excluded.  Consider fungal infection or atypical mycobacterial disease.  Other etiologies are possible.  3 to 6 months follow-up chest CT recommended.  Emphysema.  Patient was admitted and PCCM was consulted for CT chest findings.  Cardiology also consulted for intermittent palpitations.  New events last 24 hours / Subjective: Pt seen in ED. Feeling bit better.  Denies worsening SOB  Assessment & Plan:   Principal Problem:   Cavitary lesion of lung Active Problems:   Dyspnea   Palpitation   Cavitary lung lesions, emphysema -Appreciate PCCM -Workup including sputum culture, AFB culture, HIV, QuantiFERON, ANCA, anti-GBM ordered -Unasyn, azithromycin.  If cultures unrevealing, treat with Augmentin for 3 weeks -Follow-up outpatient for repeat imaging  Intermittent tachycardia and palpitations -TSH normal -Echocardiogram EF 55 to 60%, no regional wall motion abnormality, diastolic parameters normal -Cardiology consulted -Lopressor    DVT prophylaxis:  enoxaparin (LOVENOX) injection 40 mg Start: 10/14/22 1000  Code Status: Full Family Communication: At bedside Disposition Plan:  Status is: Inpatient Remains inpatient appropriate because: Work up   Consultants:  PCCM Cardiology     Antimicrobials:  Anti-infectives (From admission, onward)    Start     Dose/Rate Route Frequency Ordered Stop   10/14/22 1230  Ampicillin-Sulbactam (UNASYN) 3 g in sodium chloride 0.9 % 100 mL IVPB        3 g 200 mL/hr over 30 Minutes Intravenous Every 6 hours 10/14/22 1228     10/14/22 0045  azithromycin (ZITHROMAX) 500 mg in sodium chloride 0.9 % 250 mL IVPB        500 mg 250 mL/hr over 60 Minutes Intravenous Daily at bedtime 10/14/22 0035          Objective: Vitals:   10/14/22 0515 10/14/22 0628 10/14/22 0730 10/14/22 1011  BP:  94/60 (!) 90/57 105/83  Pulse:  73 83 85  Resp:  18 13 15   Temp: 97.8 F (36.6 C) 98.1 F (36.7 C)  98 F (36.7 C)  TempSrc: Oral Oral  SpO2:  100% 98% 98%  Weight:      Height:        Intake/Output Summary (Last 24 hours) at 10/14/2022 1400 Last data filed at 10/14/2022 0351 Gross per 24 hour  Intake 500 ml  Output --  Net 500 ml   Filed Weights   10/13/22 1553  Weight: 54 kg    Examination:  General exam: Appears calm and comfortable  Respiratory system: Clear to auscultation. Respiratory  effort normal. No respiratory distress. No conversational dyspnea.  Cardiovascular system: S1 & S2 heard, RRR. No murmurs. No pedal edema. Gastrointestinal system: Abdomen is nondistended, soft and nontender. Normal bowel sounds heard. Central nervous system: Alert and oriented. No focal neurological deficits. Speech clear.  Extremities: Symmetric in appearance  Skin: No rashes, lesions or ulcers on exposed skin  Psychiatry: Judgement and insight appear normal. Mood & affect appropriate.   Data Reviewed: I have personally reviewed following labs and imaging studies  CBC: Recent Labs  Lab 10/13/22 1617 10/14/22 0404  WBC 7.3 5.1  NEUTROABS 4.3  --   HGB 13.3 10.5*  HCT 39.3 31.6*  MCV 93.1 94.6  PLT 359 [id number]   Basic Metabolic Panel: Recent Labs  Lab 10/13/22 1617 10/14/22 0404  NA 137 135  K 3.3* 3.9  CL 102 107  CO2 23 23  GLUCOSE 84 81  BUN 8 8  CREATININE 0.56 0.58  CALCIUM 8.8* 8.0*  MG  --  1.8  PHOS  --  3.2   GFR: Estimated Creatinine Clearance: 77.6 mL/min (by C-G formula based on SCr of 0.58 mg/dL). Liver Function Tests: Recent Labs  Lab 10/13/22 1617  AST 21  ALT 11  ALKPHOS 50  BILITOT 0.8  PROT 6.9  ALBUMIN 3.9   No results for input(s): "LIPASE", "AMYLASE" in the last 168 hours. No results for input(s): "AMMONIA" in the last 168 hours. Coagulation Profile: No results for input(s): "INR", "PROTIME" in the last 168 hours. Cardiac Enzymes: No results for input(s): "CKTOTAL", "CKMB", "CKMBINDEX", "TROPONINI" in the last 168 hours. BNP (last 3 results) No results for input(s): "PROBNP" in the last 8760 hours. HbA1C: No results for input(s): "HGBA1C" in the last 72 hours. CBG: No results for input(s): "GLUCAP" in the last 168 hours. Lipid Profile: No results for input(s): "CHOL", "HDL", "LDLCALC", "TRIG", "CHOLHDL", "LDLDIRECT" in the last 72 hours. Thyroid Function Tests: Recent Labs    10/13/22 1617  TSH 4.392   Anemia Panel: No results  for input(s): "VITAMINB12", "FOLATE", "FERRITIN", "TIBC", "IRON", "RETICCTPCT" in the last 72 hours. Sepsis Labs: No results for input(s): "PROCALCITON", "LATICACIDVEN" in the last 168 hours.  No results found for this or any previous visit (from the past 240 hour(s)).    Radiology Studies: ECHOCARDIOGRAM COMPLETE  Result Date: 10/14/2022    ECHOCARDIOGRAM REPORT   Patient Name:   Shequilla Pagliaro Date of Exam: 10/14/2022 Medical Rec #:  [id number]       Height:       62.0 in Accession #:    [id number]      Weight:       119.0 lb Date of Birth:  21-Dec-1985       BSA:          1.533 m Patient Age:    36 years        BP:           105/83 mmHg Patient Gender: F  HR:           65 bpm. Exam Location:  Inpatient Procedure: 2D Echo, Cardiac Doppler and Color Doppler Indications:    Chest Pain  History:        Patient has no prior history of Echocardiogram examinations.  Sonographer:    Danne Baxter RDCS, FE, PE Referring Phys: [id number] [place]  1. Left ventricular ejection fraction, by estimation, is 55 to 60%. The left ventricle has normal function. The left ventricle has no regional wall motion abnormalities. Left ventricular diastolic parameters were normal.  2. Right ventricular systolic function is normal. The right ventricular size is normal.  3. The mitral valve is normal in structure. Trivial mitral valve regurgitation. No evidence of mitral stenosis.  4. The aortic valve is normal in structure. Aortic valve regurgitation is not visualized. No aortic stenosis is present.  5. The inferior vena cava is dilated in size with >50% respiratory variability, suggesting right atrial pressure of 8 mmHg. FINDINGS  Left Ventricle: Left ventricular ejection fraction, by estimation, is 55 to 60%. The left ventricle has normal function. The left ventricle has no regional wall motion abnormalities. The left ventricular internal cavity size was normal in size. There is  no left ventricular  hypertrophy. Left ventricular diastolic parameters were normal. Right Ventricle: The right ventricular size is normal. No increase in right ventricular wall thickness. Right ventricular systolic function is normal. Left Atrium: Left atrial size was normal in size. Right Atrium: Right atrial size was normal in size. Pericardium: There is no evidence of pericardial effusion. Mitral Valve: The mitral valve is normal in structure. Trivial mitral valve regurgitation. No evidence of mitral valve stenosis. Tricuspid Valve: The tricuspid valve is normal in structure. Tricuspid valve regurgitation is not demonstrated. No evidence of tricuspid stenosis. Aortic Valve: The aortic valve is normal in structure. Aortic valve regurgitation is not visualized. No aortic stenosis is present. Aortic valve mean gradient measures 2.0 mmHg. Aortic valve peak gradient measures 4.6 mmHg. Aortic valve area, by VTI measures 1.80 cm. Pulmonic Valve: The pulmonic valve was normal in structure. Pulmonic valve regurgitation is not visualized. No evidence of pulmonic stenosis. Aorta: The aortic root is normal in size and structure. Venous: The inferior vena cava is dilated in size with greater than 50% respiratory variability, suggesting right atrial pressure of 8 mmHg. IAS/Shunts: No atrial level shunt detected by color flow Doppler.  LEFT VENTRICLE PLAX 2D LVIDd:         4.70 cm   Diastology LVIDs:         3.20 cm   LV e' medial:    10.60 cm/s LV PW:         0.80 cm   LV E/e' medial:  7.3 LV IVS:        0.70 cm   LV e' lateral:   12.90 cm/s LVOT diam:     1.70 cm   LV E/e' lateral: 6.0 LV SV:         41 LV SV Index:   27 LVOT Area:     2.27 cm  RIGHT VENTRICLE RV S prime:     11.40 cm/s TAPSE (M-mode): 1.8 cm LEFT ATRIUM           Index        RIGHT ATRIUM           Index LA Vol (A2C): 34.4 ml 22.44 ml/m  RA Area:     14.10 cm LA Vol (A4C):  30.6 ml 19.96 ml/m  RA Volume:   35.80 ml  23.35 ml/m  AORTIC VALVE AV Area (Vmax):    1.99 cm AV  Area (Vmean):   1.90 cm AV Area (VTI):     1.80 cm AV Vmax:           107.00 cm/s AV Vmean:          71.200 cm/s AV VTI:            0.229 m AV Peak Grad:      4.6 mmHg AV Mean Grad:      2.0 mmHg LVOT Vmax:         93.80 cm/s LVOT Vmean:        59.700 cm/s LVOT VTI:          0.182 m LVOT/AV VTI ratio: 0.79  AORTA Ao Root diam: 2.80 cm Ao Asc diam:  2.60 cm MITRAL VALVE               TRICUSPID VALVE MV Area (PHT): 3.77 cm    TR Peak grad:   5.3 mmHg MV Decel Time: 201 msec    TR Vmax:        115.00 cm/s MV E velocity: 77.60 cm/s MV A velocity: 51.90 cm/s  SHUNTS MV E/A ratio:  1.50        Systemic VTI:  0.18 m                            Systemic Diam: 1.70 cm Glori Bickers MD Electronically signed by Glori Bickers MD Signature Date/Time: 10/14/2022/1:29:34 PM    Final    CT Angio Chest PE W and/or Wo Contrast  Result Date: 10/13/2022 CLINICAL DATA:  Pulmonary embolism suspected. Low to intermediate probability. Negative D-dimer. Tachycardia and palpitations with shortness of breath. EXAM: CT ANGIOGRAPHY CHEST WITH CONTRAST TECHNIQUE: Multidetector CT imaging of the chest was performed using the standard protocol during bolus administration of intravenous contrast. Multiplanar CT image reconstructions and MIPs were obtained to evaluate the vascular anatomy. RADIATION DOSE REDUCTION: This exam was performed according to the departmental dose-optimization program which includes automated exposure control, adjustment of the mA and/or kV according to patient size and/or use of iterative reconstruction technique. CONTRAST:  25mL ISOVUE-370 IOPAMIDOL (ISOVUE-370) INJECTION 76% COMPARISON:  Portable chest today, chest radiograph 03/08/2016, abdomen and pelvis CT 09/28/2019. No prior chest CT. FINDINGS: Cardiovascular: The cardiac size is normal. There is a small pericardial effusion anteriorly. The pulmonary arteries and veins are normal caliber. No arterial embolism is seen. The aorta and great vessels are  normal. Mediastinum/Nodes: No enlarged mediastinal, hilar, or axillary lymph nodes. The lower poles of the thyroid gland, trachea, and esophagus demonstrate no significant findings. Lungs/Pleura: No pleural effusion, thickening or pneumothorax is seen. There are paraseptal emphysematous changes in the upper lobes including paramediastinal blebs anteriorly. Subpleural scar-like opacities are present in both apices. There is a 4 cm thin walled bulla in the right upper lobe apex. On the left, there are perihilar coarse interstitial and underlying cystic changes in the upper lobe mid perihilar area and in the superomedial aspect of the superior segment of the lower lobe. In the left upper lobe suprahilar area there is a 2.1 x 1.7 cm irregular cavitary focus containing a small amount of fluid, with the cavity measuring 2.1 x 1.7 cm on 6:55. A smaller cavitary lesion with irregular wall thickening in the left upper lobe just below this  level measures 1.3 x 1 cm on 6:62. There are additional subpleural cystic changes and surrounding scarring in the posterior extreme left lower lobe base, and focally in the lateral basal segment of the right lower lobe, both seen previously. No confluent pneumonia or further parenchymal abnormality are noted. Upper Abdomen: No acute abnormality. Musculoskeletal: No chest wall abnormality. No acute or significant osseous findings. Review of the MIP images confirms the above findings. IMPRESSION: 1. No evidence of arterial dilatation or embolus. 2. Small pericardial effusion. 3. Upper lobe paraseptal emphysematous changes with 4 cm thin walled bulla in the right upper lobe apex. 4. Left perihilar coarse interstitial and underlying cystic changes, with 2 irregular cavitary lesions in the left upper lobe, the larger measuring 2.1 x 1.7 cm with a small amount of fluid in the larger cavity. Findings are most likely due to an atypical infectious process, with neoplasm not completely excluded.  Consider fungal infection or atypical mycobacterial disease. Other etiologies are possible. Three to six-month follow-up chest CT recommended. 5. Additional subpleural cystic changes and surrounding scarring in the posterior extreme left lower lobe base and focally in the lateral basal segment of the right lower lobe, both seen previously. Emphysema (ICD10-J43.9). Electronically Signed   By: Telford Nab M.D.   On: 10/13/2022 20:51   DG Chest Portable 1 View  Result Date: 10/13/2022 CLINICAL DATA:  Shortness of breath. EXAM: PORTABLE CHEST 1 VIEW COMPARISON:  Chest radiograph dated 03/08/2016. FINDINGS: The heart size and mediastinal contours are within normal limits. Both lungs are clear. The visualized skeletal structures are unremarkable. IMPRESSION: No active disease. Electronically Signed   By: Anner Crete M.D.   On: 10/13/2022 18:23      Scheduled Meds:  enoxaparin (LOVENOX) injection  40 mg Subcutaneous Daily   metoprolol tartrate  12.5 mg Oral BID   senna-docusate  1 tablet Oral QHS   Continuous Infusions:  ampicillin-sulbactam (UNASYN) IV     azithromycin Stopped (10/14/22 0215)     LOS: 0 days   Time spent: 25 minutes   Dessa Phi, DO [facility] 10/14/2022, 2:00 PM   Available via Epic secure chat 7am-7pm After these hours, please refer to coverage provider listed on [email or web address]

## 2022-10-14 NOTE — Telephone Encounter (Signed)
Patient is scheduled for CT on 11/25/2022 and follow up with Dr. Verlee Monte on 11/27/2022 at 3pm. Reminders mailed to address on file. Nothing further needed.

## 2022-10-14 NOTE — Consult Note (Addendum)
NAMETaisia [name], MRN:  [id number], DOB:  [birth year], LOS: 0 ADMISSION DATE:  10/13/2022, CONSULTATION DATE:  10/14/22 REFERRING MD:  Maylene Roes, CHIEF COMPLAINT:  shortness of breath and left chest pain  History of Present Illness:  37yF with history of palpitations, smoking, heart murmur who presented to ed 3/24 for left sided CP radiating to left arm. Surprisingly she has no cough, fever. No unintentional weight loss, drenching night sweats.   Only had acute dyspnea last night - she had no preceding gradual onset dyspnea or DOE.   Pertinent  Medical History  Smoking Denies congenital heart disease - just says she had a murmur as a kid  Significant Hospital Events: Including procedures, antibiotic start and stop dates in addition to other pertinent events   3/24 admitted started on ABX   Interim History / Subjective:   Objective   Blood pressure 105/83, pulse 85, temperature 98 F (36.7 C), resp. rate 15, height 5\' 2"  (1.575 m), weight 54 kg, last menstrual period 09/20/2022, SpO2 98 %.        Intake/Output Summary (Last 24 hours) at 10/14/2022 1149 Last data filed at 10/14/2022 0351 Gross per 24 hour  Intake 500 ml  Output --  Net 500 ml   Filed Weights   10/13/22 1553  Weight: 54 kg    Examination: General appearance: 37 y.o., female, NAD, conversant  Eyes: anicteric sclerae; PERRL, tracking appropriately HENT: NCAT; MMM Neck: Trachea midline; no lymphadenopathy, no JVD Lungs: CTAB, no crackles, no wheeze, with normal respiratory effort CV: RRR, no murmur  Abdomen: Soft, non-tender; non-distended, BS present  Extremities: No peripheral edema, warm Skin: Normal turgor and texture; no rash Psych: Appropriate affect Neuro: Alert and oriented to person and place, no focal deficit    Resolved Hospital Problem list     Assessment & Plan:   LUL cavitary lesions Bullous, paraseptal emphysema History of smoking Without fever, cough agree that there's no need for  airborne precautions/transmissible TB rule out. LUL lesions may be mycetoma related to MJ use. Alternatively consider NTM or remote TB infection, less likely septic embolization given how nontoxic she appears.  - sputum cx, AFB cx, HIV, quantiferon, ANCA, anti GBM, TTE - unasyn, azithro. If cultures unrevealing would lean toward treating with augmentin for 3 week total course of ABX in case it's just polymicrobial abscess - repeat imaging on outpatient basis in 6 weeks with clinic visit to review results which I will arrange - encouraged cessation of MJ smoking  We will sign off but glad to be reinvolved as condition changes  Best Practice (right click and "Reselect all SmartList Selections" daily)   Per primary  Labs   CBC: Recent Labs  Lab 10/13/22 1617 10/14/22 0404  WBC 7.3 5.1  NEUTROABS 4.3  --   HGB 13.3 10.5*  HCT 39.3 31.6*  MCV 93.1 94.6  PLT 359 [id number]    Basic Metabolic Panel: Recent Labs  Lab 10/13/22 1617 10/14/22 0404  NA 137 135  K 3.3* 3.9  CL 102 107  CO2 23 23  GLUCOSE 84 81  BUN 8 8  CREATININE 0.56 0.58  CALCIUM 8.8* 8.0*  MG  --  1.8  PHOS  --  3.2   GFR: Estimated Creatinine Clearance: 77.6 mL/min (by C-G formula based on SCr of 0.58 mg/dL). Recent Labs  Lab 10/13/22 1617 10/14/22 0404  WBC 7.3 5.1    Liver Function Tests: Recent Labs  Lab 10/13/22 1617  AST 21  ALT  11  ALKPHOS 50  BILITOT 0.8  PROT 6.9  ALBUMIN 3.9   No results for input(s): "LIPASE", "AMYLASE" in the last 168 hours. No results for input(s): "AMMONIA" in the last 168 hours.  ABG    Component Value Date/Time   TCO2 21 09/02/2011 0854     Coagulation Profile: No results for input(s): "INR", "PROTIME" in the last 168 hours.  Cardiac Enzymes: No results for input(s): "CKTOTAL", "CKMB", "CKMBINDEX", "TROPONINI" in the last 168 hours.  HbA1C: Hemoglobin A1C  Date/Time Value Ref Range Status  08/28/2021 09:12 AM 4.8 4.0 - 5.6 % Final    CBG: No results  for input(s): "GLUCAP" in the last 168 hours.  Review of Systems:   12 [place] of systems is negative except as in HPI  Past Medical History:  She,  has a past medical history of Headache(784.0), Heart murmur, History of chlamydia, History of gonorrhea, Preterm labor, and Urinary tract infection.   Surgical History:   Past Surgical History:  Procedure Laterality Date   DILATION AND CURETTAGE OF UTERUS     INDUCED ABORTION     LAPAROSCOPIC TUBAL LIGATION  01/09/2012   Procedure: LAPAROSCOPIC TUBAL LIGATION;  Surgeon: Frederico Hamman, MD;  Location: [facility];  Service: Gynecology;  Laterality: Bilateral;   ORIF WRIST FRACTURE Left 01/15/2014   Procedure: OPEN REDUCTION INTERNAL FIXATION (ORIF) WRIST FRACTURE;  Surgeon: Linna Hoff, MD;  Location: [facility];  Service: Orthopedics;  Laterality: Left;   TUBAL LIGATION       Social History:   reports that she has been smoking cigarettes. She has been smoking an average of .25 packs per day. She has never used smokeless tobacco. She reports that she does not currently use alcohol. She reports that she does not use drugs.   Family History:  Her family history includes Breast cancer in her paternal aunt. There is no history of Anesthesia problems or Hearing loss.   Allergies No Known Allergies   Home Medications  Prior to Admission medications   Medication Sig Start Date End Date Taking? Authorizing Provider  ibuprofen (ADVIL) 400 MG tablet Take 1 tablet (400 mg total) by mouth every 8 (eight) hours as needed. 08/28/21   Kerin Perna, NP  metroNIDAZOLE (FLAGYL) 500 MG tablet Take 1 tablet (500 mg total) by mouth 2 (two) times daily. 09/16/22   Kerin Perna, NP     Critical care time: na

## 2022-10-14 NOTE — ED Notes (Signed)
ED TO INPATIENT HANDOFF REPORT  ED Nurse Name and Phone #: Lynn Ito [phone]  S Name/Age/Gender Tara [name] 37 y.o. female Room/Bed: 006C/006C  Code Status   Code Status: Full Code  Home/SNF/Other Home Patient oriented to: self, place, time, and situation Is this baseline? Yes   Triage Complete: Triage complete  Chief Complaint Dyspnea [R06.00]  Triage Note Patient BIB [facility] w/ palpitations x 5 hours.  [facility] administered 324 ASA PTA.  States this has happened before and saw a cardiologist and wore a holtor monitor but they were never able to catch any events.   HR in the 130-140s.    Allergies No Known Allergies  Level of Care/Admitting Diagnosis ED Disposition     ED Disposition  Admit   Condition  --   Comment  Hospital Area: [facility] [100100]  Level of Care: Telemetry Cardiac [103]  May admit patient to [facility] or [facility] if equivalent level of care is available:: No  Covid Evaluation: Asymptomatic - no recent exposure (last 10 days) testing not required  Diagnosis: Dyspnea [241871]  Admitting Physician: Kayleen Memos [id number]  Attending Physician: Kayleen Memos [id number]  Certification:: I certify this patient will need inpatient services for at least 2 midnights  Estimated Length of Stay: 2          B Medical/Surgery History Past Medical History:  Diagnosis Date   Headache(784.0)    Heart murmur    as baby   History of chlamydia    History of gonorrhea    Preterm labor    Urinary tract infection    Past Surgical History:  Procedure Laterality Date   DILATION AND CURETTAGE OF UTERUS     INDUCED ABORTION     LAPAROSCOPIC TUBAL LIGATION  01/09/2012   Procedure: LAPAROSCOPIC TUBAL LIGATION;  Surgeon: Frederico Hamman, MD;  Location: [facility];  Service: Gynecology;  Laterality: Bilateral;   ORIF WRIST FRACTURE Left 01/15/2014   Procedure: OPEN REDUCTION INTERNAL FIXATION (ORIF) WRIST FRACTURE;   Surgeon: Linna Hoff, MD;  Location: [facility];  Service: Orthopedics;  Laterality: Left;   TUBAL LIGATION       A IV Location/Drains/Wounds Patient Lines/Drains/Airways Status     Active Line/Drains/Airways     Name Placement date Placement time Site Days   Peripheral IV 10/13/22 20 G Right Antecubital 10/13/22  1426  Antecubital  1            Intake/Output Last 24 hours  Intake/Output Summary (Last 24 hours) at 10/14/2022 1116 Last data filed at 10/14/2022 0351 Gross per 24 hour  Intake 500 ml  Output --  Net 500 ml    Labs/Imaging Results for orders placed or performed during the hospital encounter of 10/13/22 (from the past 48 hour(s))  Comprehensive metabolic panel     Status: Abnormal   Collection Time: 10/13/22  4:17 PM  Result Value Ref Range   Sodium 137 135 - 145 mmol/L   Potassium 3.3 (L) 3.5 - 5.1 mmol/L   Chloride 102 98 - 111 mmol/L   CO2 23 22 - 32 mmol/L   Glucose, Bld 84 70 - 99 mg/dL    Comment: Glucose reference range applies only to samples taken after fasting for at least 8 hours.   BUN 8 6 - 20 mg/dL   Creatinine, Ser 0.56 0.44 - 1.00 mg/dL   Calcium 8.8 (L) 8.9 - 10.3 mg/dL   Total Protein 6.9 6.5 - 8.1 g/dL  Albumin 3.9 3.5 - 5.0 g/dL   AST 21 15 - 41 U/L   ALT 11 0 - 44 U/L   Alkaline Phosphatase 50 38 - 126 U/L   Total Bilirubin 0.8 0.3 - 1.2 mg/dL   GFR, Estimated >60 >60 mL/min    Comment: (NOTE) Calculated using the CKD-EPI Creatinine Equation (2021)    Anion gap 12 5 - 15    Comment: Performed at [facility] [address] [place], [place] [address]  CBC with Differential     Status: None   Collection Time: 10/13/22  4:17 PM  Result Value Ref Range   WBC 7.3 4.0 - 10.5 K/uL   RBC 4.22 3.87 - 5.11 MIL/uL   Hemoglobin 13.3 12.0 - 15.0 g/dL   HCT 39.3 36.0 - 46.0 %   MCV 93.1 80.0 - 100.0 fL   MCH 31.5 26.0 - 34.0 pg   MCHC 33.8 30.0 - 36.0 g/dL   RDW 12.3 11.5 - 15.5 %   Platelets 359 150 - 400 K/uL   nRBC 0.0 0.0 -  0.2 %   Neutrophils Relative % 57 %   Neutro Abs 4.3 1.7 - 7.7 K/uL   Lymphocytes Relative 34 %   Lymphs Abs 2.5 0.7 - 4.0 K/uL   Monocytes Relative 7 %   Monocytes Absolute 0.5 0.1 - 1.0 K/uL   Eosinophils Relative 1 %   Eosinophils Absolute 0.1 0.0 - 0.5 K/uL   Basophils Relative 1 %   Basophils Absolute 0.0 0.0 - 0.1 K/uL   Immature Granulocytes 0 %   Abs Immature Granulocytes 0.01 0.00 - 0.07 K/uL    Comment: Performed at [facility], [address], [place], [place] [address]  TSH     Status: None   Collection Time: 10/13/22  4:17 PM  Result Value Ref Range   TSH 4.392 0.350 - 4.500 uIU/mL    Comment: Performed by a 3rd Generation assay with a functional sensitivity of <=0.01 uIU/mL. Performed at [facility], [place] [address] [place], [place] [address]   Troponin I (High Sensitivity)     Status: None   Collection Time: 10/13/22  4:17 PM  Result Value Ref Range   Troponin I (High Sensitivity) <2 <18 ng/L    Comment: (NOTE) Elevated high sensitivity troponin I (hsTnI) values and significant  changes across serial measurements may suggest ACS but many other  chronic and acute conditions are known to elevate hsTnI results.  Refer to the "Links" section for chest pain algorithms and additional  guidance. Performed at [facility], [place] [address], [place], [place] [address]   hCG, quantitative, pregnancy     Status: None   Collection Time: 10/13/22  4:17 PM  Result Value Ref Range   hCG, Beta Chain, Quant, S <1 <5 mIU/mL    Comment:          GEST. AGE      CONC.  (mIU/mL)   <=1 WEEK        5 - 50     2 WEEKS       50 - 500     3 WEEKS       100 - 10,000     4 WEEKS     1,000 - 30,000     5 WEEKS     3,500 - 115,000   6-8 WEEKS     12,000 - 270,000    12 WEEKS  15,000 - 220,000        FEMALE AND NON-PREGNANT FEMALE:     LESS THAN 5 mIU/mL Performed at [facility], [place] [address] [place], [place] [address]   D-dimer, quantitative      Status: None   Collection Time: 10/13/22  5:01 PM  Result Value Ref Range   D-Dimer, Quant <0.27 0.00 - 0.50 ug/mL-FEU    Comment: (NOTE) At the manufacturer cut-off value of 0.5 g/mL FEU, this assay has a negative predictive value of 95-100%.This assay is intended for use in conjunction with a clinical pretest probability (PTP) assessment model to exclude pulmonary embolism (PE) and deep venous thrombosis (DVT) in outpatients suspected of PE or DVT. Results should be correlated with clinical presentation. Performed at [facility], [place] [address] [place], [place] [address]   Urinalysis, Routine w reflex microscopic -Urine, Clean Catch     Status: Abnormal   Collection Time: 10/13/22  6:35 PM  Result Value Ref Range   Color, Urine STRAW (A) YELLOW   APPearance CLEAR CLEAR   Specific Gravity, Urine 1.005 1.005 - 1.030   pH 7.0 5.0 - 8.0   Glucose, UA NEGATIVE NEGATIVE mg/dL   Hgb urine dipstick NEGATIVE NEGATIVE   Bilirubin Urine NEGATIVE NEGATIVE   Ketones, ur 5 (A) NEGATIVE mg/dL   Protein, ur NEGATIVE NEGATIVE mg/dL   Nitrite NEGATIVE NEGATIVE   Leukocytes,Ua NEGATIVE NEGATIVE    Comment: Performed at [facility] [address], [place], [place] [address]  Troponin I (High Sensitivity)     Status: None   Collection Time: 10/14/22 12:24 AM  Result Value Ref Range   Troponin I (High Sensitivity) 3 <18 ng/L    Comment: (NOTE) Elevated high sensitivity troponin I (hsTnI) values and significant  changes across serial measurements may suggest ACS but many other  chronic and acute conditions are known to elevate hsTnI results.  Refer to the "Links" section for chest pain algorithms and additional  guidance. Performed at [facility], [place] [address] [place], [place] [id number]   Basic metabolic panel     Status: Abnormal   Collection Time: 10/14/22  4:04 AM  Result Value Ref Range   Sodium 135 135 - 145 mmol/L   Potassium 3.9 3.5 - 5.1 mmol/L   Chloride  107 98 - 111 mmol/L   CO2 23 22 - 32 mmol/L   Glucose, Bld 81 70 - 99 mg/dL    Comment: Glucose reference range applies only to samples taken after fasting for at least 8 hours.   BUN 8 6 - 20 mg/dL   Creatinine, Ser 0.58 0.44 - 1.00 mg/dL   Calcium 8.0 (L) 8.9 - 10.3 mg/dL   GFR, Estimated >60 >60 mL/min    Comment: (NOTE) Calculated using the CKD-EPI Creatinine Equation (2021)    Anion gap 5 5 - 15    Comment: Performed at [facility] [address], [place], [place] [address]  Magnesium     Status: None   Collection Time: 10/14/22  4:04 AM  Result Value Ref Range   Magnesium 1.8 1.7 - 2.4 mg/dL    Comment: Performed at [facility] [address] [place], [place] [address]  Phosphorus     Status: None   Collection Time: 10/14/22  4:04 AM  Result Value Ref Range   Phosphorus 3.2 2.5 - 4.6 mg/dL    Comment: Performed at [facility]  [address] [place], [place] [address]  CBC     Status: Abnormal   Collection Time: 10/14/22  4:04 AM  Result Value Ref Range   WBC 5.1 4.0 - 10.5 K/uL   RBC 3.34 (L) 3.87 - 5.11 MIL/uL   Hemoglobin 10.5 (L) 12.0 - 15.0 g/dL   HCT 31.6 (L) 36.0 - 46.0 %   MCV 94.6 80.0 - 100.0 fL   MCH 31.4 26.0 - 34.0 pg   MCHC 33.2 30.0 - 36.0 g/dL   RDW 12.6 11.5 - 15.5 %   Platelets 300 150 - 400 K/uL   nRBC 0.0 0.0 - 0.2 %    Comment: Performed at [facility], [place] [address] [place], [place] [address]   CT Angio Chest PE W and/or Wo Contrast  Result Date: 10/13/2022 CLINICAL DATA:  Pulmonary embolism suspected. Low to intermediate probability. Negative D-dimer. Tachycardia and palpitations with shortness of breath. EXAM: CT ANGIOGRAPHY CHEST WITH CONTRAST TECHNIQUE: Multidetector CT imaging of the chest was performed using the standard protocol during bolus administration of intravenous contrast. Multiplanar CT image reconstructions and MIPs were obtained to evaluate the vascular anatomy. RADIATION DOSE REDUCTION: This exam  was performed according to the departmental dose-optimization program which includes automated exposure control, adjustment of the mA and/or kV according to patient size and/or use of iterative reconstruction technique. CONTRAST:  79mL ISOVUE-370 IOPAMIDOL (ISOVUE-370) INJECTION 76% COMPARISON:  Portable chest today, chest radiograph 03/08/2016, abdomen and pelvis CT 09/28/2019. No prior chest CT. FINDINGS: Cardiovascular: The cardiac size is normal. There is a small pericardial effusion anteriorly. The pulmonary arteries and veins are normal caliber. No arterial embolism is seen. The aorta and great vessels are normal. Mediastinum/Nodes: No enlarged mediastinal, hilar, or axillary lymph nodes. The lower poles of the thyroid gland, trachea, and esophagus demonstrate no significant findings. Lungs/Pleura: No pleural effusion, thickening or pneumothorax is seen. There are paraseptal emphysematous changes in the upper lobes including paramediastinal blebs anteriorly. Subpleural scar-like opacities are present in both apices. There is a 4 cm thin walled bulla in the right upper lobe apex. On the left, there are perihilar coarse interstitial and underlying cystic changes in the upper lobe mid perihilar area and in the superomedial aspect of the superior segment of the lower lobe. In the left upper lobe suprahilar area there is a 2.1 x 1.7 cm irregular cavitary focus containing a small amount of fluid, with the cavity measuring 2.1 x 1.7 cm on 6:55. A smaller cavitary lesion with irregular wall thickening in the left upper lobe just below this level measures 1.3 x 1 cm on 6:62. There are additional subpleural cystic changes and surrounding scarring in the posterior extreme left lower lobe base, and focally in the lateral basal segment of the right lower lobe, both seen previously. No confluent pneumonia or further parenchymal abnormality are noted. Upper Abdomen: No acute abnormality. Musculoskeletal: No chest wall  abnormality. No acute or significant osseous findings. Review of the MIP images confirms the above findings. IMPRESSION: 1. No evidence of arterial dilatation or embolus. 2. Small pericardial effusion. 3. Upper lobe paraseptal emphysematous changes with 4 cm thin walled bulla in the right upper lobe apex. 4. Left perihilar coarse interstitial and underlying cystic changes, with 2 irregular cavitary lesions in the left upper lobe, the larger measuring 2.1 x 1.7 cm with a small amount of fluid in the larger cavity. Findings are most likely due to an atypical infectious process, with neoplasm not completely excluded. Consider fungal infection or  atypical mycobacterial disease. Other etiologies are possible. Three to six-month follow-up chest CT recommended. 5. Additional subpleural cystic changes and surrounding scarring in the posterior extreme left lower lobe base and focally in the lateral basal segment of the right lower lobe, both seen previously. Emphysema (ICD10-J43.9). Electronically Signed   By: Telford Nab M.D.   On: 10/13/2022 20:51   DG Chest Portable 1 View  Result Date: 10/13/2022 CLINICAL DATA:  Shortness of breath. EXAM: PORTABLE CHEST 1 VIEW COMPARISON:  Chest radiograph dated 03/08/2016. FINDINGS: The heart size and mediastinal contours are within normal limits. Both lungs are clear. The visualized skeletal structures are unremarkable. IMPRESSION: No active disease. Electronically Signed   By: Anner Crete M.D.   On: 10/13/2022 18:23    Pending Labs Unresulted Labs (From admission, onward)     Start     Ordered   10/20/22 0500  Creatinine, serum  (enoxaparin (LOVENOX)    CrCl >/= 30 ml/min)  Weekly,   R     Comments: while on enoxaparin therapy    10/13/22 2315   10/14/22 0843  Expectorated Sputum Assessment w Gram Stain, Rflx to Resp Cult  Once,   R        10/14/22 0842   10/14/22 0842  QuantiFERON-TB Gold Plus  Once,   R        10/14/22 0841   10/14/22 0842  HIV Antibody  (routine testing w rflx)  (HIV Antibody (Routine testing w reflex) panel)  Once,   R        10/14/22 0841   10/14/22 0842  ANA w/Reflex if Positive  Once,   R        10/14/22 0841   10/14/22 0842  ANCA Titers  (Anti-Neutrophilic Cystoplasmic Antibody Panel (PNL))  Once,   R        10/14/22 0841   10/14/22 0842  Glomerular basement membrane antibodies  Once,   R        10/14/22 0841   10/14/22 0841  Acid Fast Smear (AFB)  (AFB smear + Culture w reflexed sensitivities panel)  Now then every 8 hours,   R (with TIMED occurrences)     See Hyperspace for full Linked Orders Report.   10/14/22 0840   10/14/22 0841  Acid Fast Culture with reflexed sensitivities  (AFB smear + Culture w reflexed sensitivities panel)  Now then every 8 hours,   R (with TIMED occurrences)     See Hyperspace for full Linked Orders Report.   10/14/22 0840   10/14/22 0841  MTB-RIF NAA with AFB Culture, sputum  Once,   R        10/14/22 0840   10/14/22 0841  Aspergillus antibody by immunodiff  Once,   R        10/14/22 0841   10/13/22 1601  Rapid urine drug screen (hospital performed)  ONCE - STAT,   STAT        10/13/22 1601            Vitals/Pain Today's Vitals   10/14/22 0515 10/14/22 0628 10/14/22 0730 10/14/22 1011  BP:  94/60 (!) 90/57 105/83  Pulse:  73 83 85  Resp:  18 13 15   Temp: 97.8 F (36.6 C) 98.1 F (36.7 C)    TempSrc: Oral Oral    SpO2:  100% 98% 98%  Weight:      Height:      PainSc:        Isolation Precautions Airborne  precautions  Medications Medications  enoxaparin (LOVENOX) injection 40 mg (40 mg Subcutaneous Given 10/14/22 1012)  morphine (PF) 2 MG/ML injection 2 mg (has no administration in time range)  acetaminophen (TYLENOL) tablet 650 mg (has no administration in time range)  oxyCODONE (Oxy IR/ROXICODONE) immediate release tablet 5 mg (has no administration in time range)  senna-docusate (Senokot-S) tablet 1 tablet (1 tablet Oral Patient Refused/Not Given 10/14/22 0056)   azithromycin (ZITHROMAX) 500 mg in sodium chloride 0.9 % 250 mL IVPB (0 mg Intravenous Stopped 10/14/22 0215)  metoprolol tartrate (LOPRESSOR) tablet 12.5 mg (12.5 mg Oral Given 10/14/22 1011)  prochlorperazine (COMPAZINE) injection 5 mg (has no administration in time range)  melatonin tablet 5 mg (has no administration in time range)  lactated ringers bolus 1,000 mL (0 mLs Intravenous Stopped 10/13/22 1840)  potassium chloride SA (KLOR-CON M) CR tablet 20 mEq (20 mEq Oral Given 10/13/22 1840)  HYDROcodone-acetaminophen (NORCO/VICODIN) 5-325 MG per tablet 1 tablet (1 tablet Oral Given 10/13/22 1840)  iopamidol (ISOVUE-370) 76 % injection 75 mL (75 mLs Intravenous Contrast Given 10/13/22 2012)  potassium chloride SA (KLOR-CON M) CR tablet 20 mEq (20 mEq Oral Given 10/14/22 0105)  sodium chloride 0.9 % bolus 500 mL (0 mLs Intravenous Stopped 10/14/22 0351)    Mobility walks     Focused Assessments Cardiac Assessment Handoff:  Cardiac Rhythm: Sinus tachycardia No results found for: "CKTOTAL", "CKMB", "CKMBINDEX", "TROPONINI" Lab Results  Component Value Date   DDIMER <0.27 10/13/2022   Does the Patient currently have chest pain? No    R Recommendations: See Admitting Provider Note  Report given to:   Additional Notes:

## 2022-10-14 NOTE — Consult Note (Addendum)
Cardiology Consultation   Patient ID: [name] MRN: [id number]; DOB: 1985-08-23  Admit date: 10/13/2022 Date of Consult: 10/14/2022  PCP:  Kerin Perna, NP   [facility] [facility] Cardiologist:  Quay Burow, MD  -- New   Patient Profile:   Tara [name] is a 37 y.o. female with a hx of intermittent palpitations who is being seen 10/14/2022 for the evaluation of tachycardia at the request of Dr. Maylene Roes.  History of Present Illness:   [name] is a 37 year old female with above medical history.  Per chart review, patient does not have any past cardiac history and does not follow with cardiology regularly.  Patient reported having a history of congenital heart disease, but only cardiac history appears to be having a heart murmur as a baby.  Presented to urgent care on 3/24 complaining of palpitations that had started earlier that morning.  Also complained of some chest pain that radiated down left arm.  Urgent care sent him to the ED for further evaluation. Initial ED and the EKG showed atrial flutter, heart rate 134 bpm.  Labs in the ED significant for high-sensitivity troponin <2, 3.  Potassium 3.3, creatinine 0.56, WBC 7.3, hemoglobin 13.3, platelets 359.  TSH within normal limits at 4.392. Chest x-ray showed no active disease.  CTA chest showed no evidence of arterial dilation or embolus, small pericardial effusion, upper load paraseptal emphysematous changes with 4 cm thin-walled bulla in the right upper lobe apex.  Also showed 2 irregular cavitary lesions in the left upper lobe.  Patient was admitted to the internal medicine service and was started on Lopressor 12.5 mg twice daily.  Pulm was consulted for evaluation of left upper lobe cavitary lesions, paraseptal emphysema.  Cardiology consulted for tachycardia   Echocardiogram 3/25 showed EF 55-60%, no regional wall motion abnormalities, normal RV systolic function.   On interview, patient reports that she  has had intermittent palpitations since 2010.  Reports that these episodes usually last about 15 minutes at a time.  Feels like her heart is pounding in her chest and she gets short of breath.  Episodes do not happen on a consistent basis and seem to occur randomly.  Yesterday morning, patient woke up and felt like her heartbeat was going really fast.  She also developed shortness of breath, left-sided chest pain that shot down her left arm.  Symptoms persisted for few hours after she came to the emergency department.  When she was given metoprolol and her heart rate slowed down, her symptoms resolved.  She reports that the night prior, she had smoked marijuana and drank about 2 shots of alcohol.  She had a heart murmur as a baby, denies other cardiac history.  She wore a heart monitor in her 58s due to palpitations, heart monitor did not show any signs of arrhythmia.  Patient denies recent cough, fever, chills, body aches.  She has had some night sweats.  Also has had some weight gain.  No nausea, vomiting.  Past Medical History:  Diagnosis Date   Headache(784.0)    Heart murmur    as baby   History of chlamydia    History of gonorrhea    Preterm labor    Urinary tract infection     Past Surgical History:  Procedure Laterality Date   DILATION AND CURETTAGE OF UTERUS     INDUCED ABORTION     LAPAROSCOPIC TUBAL LIGATION  01/09/2012   Procedure: LAPAROSCOPIC TUBAL LIGATION;  Surgeon: Frederico Hamman,  MD;  Location: [facility];  Service: Gynecology;  Laterality: Bilateral;   ORIF WRIST FRACTURE Left 01/15/2014   Procedure: OPEN REDUCTION INTERNAL FIXATION (ORIF) WRIST FRACTURE;  Surgeon: Linna Hoff, MD;  Location: [facility];  Service: Orthopedics;  Laterality: Left;   TUBAL LIGATION       Home Medications:  Prior to Admission medications   Medication Sig Start Date End Date Taking? Authorizing Provider  ibuprofen (ADVIL) 400 MG tablet Take 1 tablet (400 mg total) by mouth every 8 (eight) hours  as needed. 08/28/21   Kerin Perna, NP  metroNIDAZOLE (FLAGYL) 500 MG tablet Take 1 tablet (500 mg total) by mouth 2 (two) times daily. 09/16/22   Kerin Perna, NP    Inpatient Medications: Scheduled Meds:  enoxaparin (LOVENOX) injection  40 mg Subcutaneous Daily   metoprolol tartrate  12.5 mg Oral BID   senna-docusate  1 tablet Oral QHS   Continuous Infusions:  ampicillin-sulbactam (UNASYN) IV     azithromycin Stopped (10/14/22 0215)   PRN Meds: acetaminophen, melatonin, morphine injection, oxyCODONE, prochlorperazine  Allergies:   No Known Allergies  Social History:   Social History   Socioeconomic History   Marital status: Significant Other    Spouse name: Not on file   Number of children: Not on file   Years of education: Not on file   Highest education level: Not on file  Occupational History   Not on file  Tobacco Use   Smoking status: Some Days    Packs/day: .25    Types: Cigarettes   Smokeless tobacco: Never  Vaping Use   Vaping Use: Some days   Substances: Nicotine, Flavoring  Substance and Sexual Activity   Alcohol use: Not Currently   Drug use: No   Sexual activity: Yes    Birth control/protection: Surgical  Other Topics Concern   Not on file  Social History Narrative   Not on file   Social Determinants of Health   Financial Resource Strain: Not on file  Food Insecurity: No Food Insecurity (10/14/2022)   Hunger Vital Sign    Worried About Running Out of Food in the Last Year: Never true    Ran Out of Food in the Last Year: Never true  Transportation Needs: No Transportation Needs (10/14/2022)   PRAPARE - Hydrologist (Medical): No    Lack of Transportation (Non-Medical): No  Physical Activity: Not on file  Stress: Not on file  Social Connections: Not on file  Intimate Partner Violence: Not At Risk (10/14/2022)   Humiliation, Afraid, Rape, and Kick questionnaire    Fear of Current or Ex-Partner: No     Emotionally Abused: No    Physically Abused: No    Sexually Abused: No    Family History:    Family History  Problem Relation Age of Onset   Breast cancer Paternal Aunt        in 20's or 30's   Anesthesia problems Neg Hx    Hearing loss Neg Hx      ROS:  Please see the history of present illness.   All other ROS reviewed and negative.     Physical Exam/Data:   Vitals:   10/14/22 0628 10/14/22 0730 10/14/22 1011 10/14/22 1500  BP: 94/60 (!) 90/57 105/83 93/61  Pulse: 73 83 85 69  Resp: 18 13 15 18   Temp: 98.1 F (36.7 C)  98 F (36.7 C) 98.8 F (37.1 C)  TempSrc: Oral  Oral  SpO2: 100% 98% 98% 100%  Weight:    51.8 kg  Height:    5\' 3"  (1.6 m)    Intake/Output Summary (Last 24 hours) at 10/14/2022 1607 Last data filed at 10/14/2022 0351 Gross per 24 hour  Intake 500 ml  Output --  Net 500 ml      10/14/2022    3:00 PM 10/13/2022    3:53 PM 09/12/2022   10:47 AM  Last 3 Weights  Weight (lbs) 114 lb 4.8 oz 119 lb 117 lb 9.6 oz  Weight (kg) 51.846 kg 53.978 kg 53.343 kg     Body mass index is 20.25 kg/m.  General:  Well nourished, well developed, in no acute distress.  Sitting upright in the bed HEENT: normal Neck: no JVD. Soft carotid bruit on right side  Vascular: Radial pulses 2+ bilaterally Cardiac:  normal S1, S2; RRR; no murmur Lungs:  clear to auscultation bilaterally, no wheezing, rhonchi or rales.  Normal work of breathing on room air Abd: soft, nontender Ext: no edema in ble Musculoskeletal:  No deformities, BUE and BLE strength normal and equal Skin: warm and dry  Neuro:  CNs 2-12 intact, no focal abnormalities noted Psych:  Normal affect   EKG:  The EKG was personally reviewed and demonstrates: Sinus tachycardia, heart rate 127 bpm Telemetry:  Telemetry was personally reviewed and demonstrates: Sinus rhythm, heart rate in the 80s  Relevant CV Studies:  Echocardiogram 10/14/22  1. Left ventricular ejection fraction, by estimation, is 55 to  60%. The  left ventricle has normal function. The left ventricle has no regional  wall motion abnormalities. Left ventricular diastolic parameters were  normal.   2. Right ventricular systolic function is normal. The right ventricular  size is normal.   3. The mitral valve is normal in structure. Trivial mitral valve  regurgitation. No evidence of mitral stenosis.   4. The aortic valve is normal in structure. Aortic valve regurgitation is  not visualized. No aortic stenosis is present.   5. The inferior vena cava is dilated in size with >50% respiratory  variability, suggesting right atrial pressure of 8 mmHg   Laboratory Data:  High Sensitivity Troponin:   Recent Labs  Lab 10/13/22 1617 10/14/22 0024  TROPONINIHS <2 3     Chemistry Recent Labs  Lab 10/13/22 1617 10/14/22 0404  NA 137 135  K 3.3* 3.9  CL 102 107  CO2 23 23  GLUCOSE 84 81  BUN 8 8  CREATININE 0.56 0.58  CALCIUM 8.8* 8.0*  MG  --  1.8  GFRNONAA >60 >60  ANIONGAP 12 5    Recent Labs  Lab 10/13/22 1617  PROT 6.9  ALBUMIN 3.9  AST 21  ALT 11  ALKPHOS 50  BILITOT 0.8   Lipids No results for input(s): "CHOL", "TRIG", "HDL", "LABVLDL", "LDLCALC", "CHOLHDL" in the last 168 hours.  Hematology Recent Labs  Lab 10/13/22 1617 10/14/22 0404  WBC 7.3 5.1  RBC 4.22 3.34*  HGB 13.3 10.5*  HCT 39.3 31.6*  MCV 93.1 94.6  MCH 31.5 31.4  MCHC 33.8 33.2  RDW 12.3 12.6  PLT 359 300   Thyroid  Recent Labs  Lab 10/13/22 1617  TSH 4.392    BNPNo results for input(s): "BNP", "PROBNP" in the last 168 hours.  DDimer  Recent Labs  Lab 10/13/22 1701  DDIMER <0.27     Radiology/Studies:  ECHOCARDIOGRAM COMPLETE  Result Date: 10/14/2022    ECHOCARDIOGRAM REPORT   Patient Name:  Kathie Cobos Date of Exam: 10/14/2022 Medical Rec #:  [id number]       Height:       62.0 in Accession #:    [id number]      Weight:       119.0 lb Date of Birth:  [birth year]       BSA:          1.533 m Patient Age:    36  years        BP:           105/83 mmHg Patient Gender: F               HR:           65 bpm. Exam Location:  Inpatient Procedure: 2D Echo, Cardiac Doppler and Color Doppler Indications:    Chest Pain  History:        Patient has no prior history of Echocardiogram examinations.  Sonographer:    Danne Baxter RDCS, FE, PE Referring Phys: [id number] [place]  1. Left ventricular ejection fraction, by estimation, is 55 to 60%. The left ventricle has normal function. The left ventricle has no regional wall motion abnormalities. Left ventricular diastolic parameters were normal.  2. Right ventricular systolic function is normal. The right ventricular size is normal.  3. The mitral valve is normal in structure. Trivial mitral valve regurgitation. No evidence of mitral stenosis.  4. The aortic valve is normal in structure. Aortic valve regurgitation is not visualized. No aortic stenosis is present.  5. The inferior vena cava is dilated in size with >50% respiratory variability, suggesting right atrial pressure of 8 mmHg. FINDINGS  Left Ventricle: Left ventricular ejection fraction, by estimation, is 55 to 60%. The left ventricle has normal function. The left ventricle has no regional wall motion abnormalities. The left ventricular internal cavity size was normal in size. There is  no left ventricular hypertrophy. Left ventricular diastolic parameters were normal. Right Ventricle: The right ventricular size is normal. No increase in right ventricular wall thickness. Right ventricular systolic function is normal. Left Atrium: Left atrial size was normal in size. Right Atrium: Right atrial size was normal in size. Pericardium: There is no evidence of pericardial effusion. Mitral Valve: The mitral valve is normal in structure. Trivial mitral valve regurgitation. No evidence of mitral valve stenosis. Tricuspid Valve: The tricuspid valve is normal in structure. Tricuspid valve regurgitation is not demonstrated. No  evidence of tricuspid stenosis. Aortic Valve: The aortic valve is normal in structure. Aortic valve regurgitation is not visualized. No aortic stenosis is present. Aortic valve mean gradient measures 2.0 mmHg. Aortic valve peak gradient measures 4.6 mmHg. Aortic valve area, by VTI measures 1.80 cm. Pulmonic Valve: The pulmonic valve was normal in structure. Pulmonic valve regurgitation is not visualized. No evidence of pulmonic stenosis. Aorta: The aortic root is normal in size and structure. Venous: The inferior vena cava is dilated in size with greater than 50% respiratory variability, suggesting right atrial pressure of 8 mmHg. IAS/Shunts: No atrial level shunt detected by color flow Doppler.  LEFT VENTRICLE PLAX 2D LVIDd:         4.70 cm   Diastology LVIDs:         3.20 cm   LV e' medial:    10.60 cm/s LV PW:         0.80 cm   LV E/e' medial:  7.3 LV IVS:        0.70 cm  LV e' lateral:   12.90 cm/s LVOT diam:     1.70 cm   LV E/e' lateral: 6.0 LV SV:         41 LV SV Index:   27 LVOT Area:     2.27 cm  RIGHT VENTRICLE RV S prime:     11.40 cm/s TAPSE (M-mode): 1.8 cm LEFT ATRIUM           Index        RIGHT ATRIUM           Index LA Vol (A2C): 34.4 ml 22.44 ml/m  RA Area:     14.10 cm LA Vol (A4C): 30.6 ml 19.96 ml/m  RA Volume:   35.80 ml  23.35 ml/m  AORTIC VALVE AV Area (Vmax):    1.99 cm AV Area (Vmean):   1.90 cm AV Area (VTI):     1.80 cm AV Vmax:           107.00 cm/s AV Vmean:          71.200 cm/s AV VTI:            0.229 m AV Peak Grad:      4.6 mmHg AV Mean Grad:      2.0 mmHg LVOT Vmax:         93.80 cm/s LVOT Vmean:        59.700 cm/s LVOT VTI:          0.182 m LVOT/AV VTI ratio: 0.79  AORTA Ao Root diam: 2.80 cm Ao Asc diam:  2.60 cm MITRAL VALVE               TRICUSPID VALVE MV Area (PHT): 3.77 cm    TR Peak grad:   5.3 mmHg MV Decel Time: 201 msec    TR Vmax:        115.00 cm/s MV E velocity: 77.60 cm/s MV A velocity: 51.90 cm/s  SHUNTS MV E/A ratio:  1.50        Systemic VTI:  0.18 m                             Systemic Diam: 1.70 cm Glori Bickers MD Electronically signed by Glori Bickers MD Signature Date/Time: 10/14/2022/1:29:34 PM    Final    CT Angio Chest PE W and/or Wo Contrast  Result Date: 10/13/2022 CLINICAL DATA:  Pulmonary embolism suspected. Low to intermediate probability. Negative D-dimer. Tachycardia and palpitations with shortness of breath. EXAM: CT ANGIOGRAPHY CHEST WITH CONTRAST TECHNIQUE: Multidetector CT imaging of the chest was performed using the standard protocol during bolus administration of intravenous contrast. Multiplanar CT image reconstructions and MIPs were obtained to evaluate the vascular anatomy. RADIATION DOSE REDUCTION: This exam was performed according to the departmental dose-optimization program which includes automated exposure control, adjustment of the mA and/or kV according to patient size and/or use of iterative reconstruction technique. CONTRAST:  28mL ISOVUE-370 IOPAMIDOL (ISOVUE-370) INJECTION 76% COMPARISON:  Portable chest today, chest radiograph 03/08/2016, abdomen and pelvis CT 09/28/2019. No prior chest CT. FINDINGS: Cardiovascular: The cardiac size is normal. There is a small pericardial effusion anteriorly. The pulmonary arteries and veins are normal caliber. No arterial embolism is seen. The aorta and great vessels are normal. Mediastinum/Nodes: No enlarged mediastinal, hilar, or axillary lymph nodes. The lower poles of the thyroid gland, trachea, and esophagus demonstrate no significant findings. Lungs/Pleura: No pleural effusion, thickening or pneumothorax is seen. There are paraseptal  emphysematous changes in the upper lobes including paramediastinal blebs anteriorly. Subpleural scar-like opacities are present in both apices. There is a 4 cm thin walled bulla in the right upper lobe apex. On the left, there are perihilar coarse interstitial and underlying cystic changes in the upper lobe mid perihilar area and in the superomedial  aspect of the superior segment of the lower lobe. In the left upper lobe suprahilar area there is a 2.1 x 1.7 cm irregular cavitary focus containing a small amount of fluid, with the cavity measuring 2.1 x 1.7 cm on 6:55. A smaller cavitary lesion with irregular wall thickening in the left upper lobe just below this level measures 1.3 x 1 cm on 6:62. There are additional subpleural cystic changes and surrounding scarring in the posterior extreme left lower lobe base, and focally in the lateral basal segment of the right lower lobe, both seen previously. No confluent pneumonia or further parenchymal abnormality are noted. Upper Abdomen: No acute abnormality. Musculoskeletal: No chest wall abnormality. No acute or significant osseous findings. Review of the MIP images confirms the above findings. IMPRESSION: 1. No evidence of arterial dilatation or embolus. 2. Small pericardial effusion. 3. Upper lobe paraseptal emphysematous changes with 4 cm thin walled bulla in the right upper lobe apex. 4. Left perihilar coarse interstitial and underlying cystic changes, with 2 irregular cavitary lesions in the left upper lobe, the larger measuring 2.1 x 1.7 cm with a small amount of fluid in the larger cavity. Findings are most likely due to an atypical infectious process, with neoplasm not completely excluded. Consider fungal infection or atypical mycobacterial disease. Other etiologies are possible. Three to six-month follow-up chest CT recommended. 5. Additional subpleural cystic changes and surrounding scarring in the posterior extreme left lower lobe base and focally in the lateral basal segment of the right lower lobe, both seen previously. Emphysema (ICD10-J43.9). Electronically Signed   By: Telford Nab M.D.   On: 10/13/2022 20:51   DG Chest Portable 1 View  Result Date: 10/13/2022 CLINICAL DATA:  Shortness of breath. EXAM: PORTABLE CHEST 1 VIEW COMPARISON:  Chest radiograph dated 03/08/2016. FINDINGS: The heart  size and mediastinal contours are within normal limits. Both lungs are clear. The visualized skeletal structures are unremarkable. IMPRESSION: No active disease. Electronically Signed   By: Anner Crete M.D.   On: 10/13/2022 18:23     Assessment and Plan:   Tachycardia Palpitations - Patient reports that she has had intermittent palpitations since 2010.  Wore a heart monitor when she was in her 75s that showed no significant arrhythmia.  Episodes of palpitations occur randomly, usually lasts for about 15 minutes at a time.  Yesterday, she woke up with palpitations associated with shortness of breath, left-sided chest pain that shot down her arm.  Symptoms persisted until she was given metoprolol in the ED. - TSH within normal limits. hsTn negative x2.  Echocardiogram this admission showed EF 55-60%, no regional wall motion abnormalities, normal diastolic parameters, normal RV systolic function. - Patient does admit that the night prior to presentation, she had smoked marijuana and drank about 2 shots of alcohol.  Possible that these have contributed to her symptoms. Patient also admits to vaping, and believes that vaping has been causing her palpitations. She plans to quit vaping  - Tachycardia/palpitations could also be secondary to underlying lung issues  - Since starting metoprolol 12.5 mg twice daily, patient's heart rate has been well-controlled.  She denies recurrent episodes of palpitations - Ordered 2 week  zio   Carotid Bruit  - Patient has a soft carotid bruit on right side  - Needs carotid dopplers as an outpatient   Otherwise per primary -Cavitary lung lesions -Emphysema  Risk Assessment/Risk Scores:     For questions or updates, please contact [place] Please consult [email or web address] for contact info under    Signed, Margie Billet, PA-C  10/14/2022 4:07 PM  Agree with note by Vikki Ports, PA-C  37 year old African-American female admitted with  palpitations.  She says she has had these for years.  She did drink some alcohol use marijuana the night before.  She is currently in sinus rhythm on low-dose beta-blocker.  2D echo was normal.  Her cardiac enzymes were negative.  Thyroid functions were normal.  Her exam was benign except for a soft right carotid bruit.  Suspect this was sinus tachycardia.  Will place a 2-week Zio patch on her prior to discharge and obtain an outpatient carotid Doppler study.  From our point of view, stable for discharge.  Lorretta Harp, M.D., [place], [facility], [place] [address]. [place], [place]  [address]  [phone] 10/14/2022 4:24 PM

## 2022-10-14 NOTE — ED Notes (Signed)
Admitting MD at bedside.

## 2022-10-14 NOTE — ED Notes (Signed)
Tried to get patient blood I wasn't able to get .The Nurse was informed.

## 2022-10-14 NOTE — Progress Notes (Unsigned)
Enrolled for [employer] to mail a ZIO XT long term holter monitor to the patients address on file.   Dr. Berry to read. 

## 2022-10-15 ENCOUNTER — Other Ambulatory Visit: Payer: Self-pay | Admitting: Cardiology

## 2022-10-15 DIAGNOSIS — J984 Other disorders of lung: Secondary | ICD-10-CM | POA: Diagnosis not present

## 2022-10-15 DIAGNOSIS — R000 Tachycardia, unspecified: Secondary | ICD-10-CM

## 2022-10-15 MED ORDER — AMOXICILLIN-POT CLAVULANATE 875-125 MG PO TABS: 1.0000 | ORAL_TABLET | Freq: Two times a day (BID) | ORAL | 0 refills | Status: AC

## 2022-10-15 NOTE — TOC Transition Note (Signed)
Transition of Care [facility]) - CM/SW Discharge Note   Patient Details  Name: Tara [name] MRN: [id number] Date of Birth: 1985/10/04  [facility] CM/SW Contact:  Zenon Mayo, RN Phone Number: 10/15/2022, 1:10 PM   Clinical Narrative:    Patient is for dc today, she has no needs.         Patient Goals and CMS Choice      Discharge Placement                         Discharge Plan and Services Additional resources added to the After Visit Summary for                                       Social Determinants of Health (SDOH) Interventions SDOH Screenings   Food Insecurity: No Food Insecurity (10/14/2022)  Housing: Low Risk  (10/14/2022)  Transportation Needs: No Transportation Needs (10/14/2022)  Utilities: Not At Risk (10/14/2022)  Depression (PHQ2-9): Low Risk  (09/12/2022)  Tobacco Use: High Risk (10/13/2022)     Readmission Risk Interventions     No data to display

## 2022-10-15 NOTE — Discharge Summary (Signed)
Physician Discharge Summary  Tara [name] [id number] DOB: 04-13-1986 DOA: 10/13/2022  PCP: Kerin Perna, NP  Admit date: 10/13/2022 Discharge date: 10/15/2022  Admitted From: Home Disposition:  Home   Recommendations for Outpatient Follow-up:  Follow up with Pulmonology Dr. Leslye Peer 5/8  Follow up with Cardiology Coletta Memos NP 4/29  Discharge Condition: Stable CODE STATUS: Full  Diet recommendation: Regular   Brief/Interim Summary: Tara [name] is a 37 y.o. female with medical history significant for intermittent palpitations since 2010 but worsened this morning.  They usually resolve spontaneously however since starting this morning they have been persistent.  Endorses history of congenital heart disease, not currently followed by cardiology.  States she can feel her heart race.  Associated with nonexertional dyspnea.  Concurrently she developed left-sided chest discomfort radiating to her left arm.  Denies recent use of tobacco, quit 4 years ago.  Denies cough or hemoptysis.  No weight loss Instat weight gain.  No incarceration, no traveling outside the [place].  Admits to chills and night sweats.  Her significant other convinced her to come to the ED for further evaluation.   In the ED, noted to be persistently tachycardic.  Due to concern for possible pulmonary embolism she had a CT angio chest which was negative for pulmonary embolism however it showed significant abnormalities, incidentally found, which include small pericardial effusion, upper lobe paraseptal emphysematous changes with 4 cm thin-walled bulla in the right upper lobe apex.  Left perihilar coarse interstitial and underlying cystic changes, with 2 irregular cavitary lesions in the left upper lobe, the larger measuring 2.1 x 1.7 cm with a small amount of fluid in the larger cavity.  Findings are more likely due to an atypical infectious process with neoplasm not completely excluded.  Consider fungal infection  or atypical mycobacterial disease.  Other etiologies are possible.  3 to 6 months follow-up chest CT recommended.  Emphysema.   Patient was admitted and PCCM was consulted for CT chest findings.  Cardiology also consulted for intermittent palpitations.  Pulmonology evaluated patient regarding cavitary lesions, bullous paraseptal emphysema.  Sputum culture, AFB culture were ordered, however patient did not have productive sputum to obtain this.  She was treated with Unasyn and azithromycin, this was transitioned to Augmentin for 3 weeks.  She has close pulmonology follow-up with repeat CT as outpatient.  She was also seen by cardiology.  She will have 2-week Zio patch prior to discharge and to follow-up outpatient for outpatient carotid Doppler study as well.  Metoprolol was discontinued due to low blood pressure.  On day of discharge, patient was feeling well.  She did not have any respiratory distress, remained on room air, stable in normal sinus rhythm.  Discharge Diagnoses:  Principal Problem:   Cavitary lesion of lung Active Problems:   Dyspnea   Palpitation   Sinus tachycardia    Cavitary lung lesions, emphysema -Appreciate PCCM -Sputum culture and AFB were unable to be obtained as patient did not have productive sputum -Augmentin for 3 weeks -Follow-up with pulmonology outpatient for repeat imaging   Intermittent tachycardia and palpitations -TSH normal -Echocardiogram EF 55 to 60%, no regional wall motion abnormality, diastolic parameters normal -Lopressor discontinued due to marginal blood pressure -Zio patch for 2 weeks  Discharge Instructions  Discharge Instructions     Call MD for:  difficulty breathing, headache or visual disturbances   Complete by: As directed    Call MD for:  extreme fatigue   Complete by: As directed  Call MD for:  persistant dizziness or light-headedness   Complete by: As directed    Call MD for:  persistant nausea and vomiting   Complete  by: As directed    Call MD for:  severe uncontrolled pain   Complete by: As directed    Call MD for:  temperature >100.4   Complete by: As directed    Diet general   Complete by: As directed    Discharge instructions   Complete by: As directed    You were cared for by a hospitalist during your hospital stay. If you have any questions about your discharge medications or the care you received while you were in the hospital after you are discharged, you can call the unit and ask to speak with the hospitalist on call if the hospitalist that took care of you is not available. Once you are discharged, your primary care physician will handle any further medical issues. Please note that NO REFILLS for any discharge medications will be authorized once you are discharged, as it is imperative that you return to your primary care physician (or establish a relationship with a primary care physician if you do not have one) for your aftercare needs so that they can reassess your need for medications and monitor your lab values.   Increase activity slowly   Complete by: As directed       Allergies as of 10/15/2022   No Known Allergies      Medication List     STOP taking these medications    metroNIDAZOLE 500 MG tablet Commonly known as: FLAGYL       TAKE these medications    amoxicillin-clavulanate 875-125 MG tablet Commonly known as: AUGMENTIN Take 1 tablet by mouth 2 (two) times daily for 21 days.   ibuprofen 400 MG tablet Commonly known as: ADVIL Take 1 tablet (400 mg total) by mouth every 8 (eight) hours as needed.        Follow-up Information     Marilynn Rail Jossie Ng, NP. Go on 11/18/2022.   Specialty: Cardiology Contact information: [address] [place] [address] [phone]         Maryjane Hurter, MD. Go on 11/27/2022.   Specialty: Pulmonary Disease Contact information: [place] [address] [phone]                 No Known Allergies  Consultations: Cardiology PCCM    Procedures/Studies: ECHOCARDIOGRAM COMPLETE  Result Date: 10/14/2022    ECHOCARDIOGRAM REPORT   Patient Name:   Tara [name] Date of Exam: 10/14/2022 Medical Rec #:  [id number]       Height:       62.0 in Accession #:    [id number]      Weight:       119.0 lb Date of Birth:  [birth year]       BSA:          1.533 m Patient Age:    36 years        BP:           105/83 mmHg Patient Gender: F               HR:           65 bpm. Exam Location:  Inpatient Procedure: 2D Echo, Cardiac Doppler and Color Doppler Indications:    Chest Pain  History:        Patient has no prior history of  Echocardiogram examinations.  Sonographer:    Danne Baxter RDCS, FE, PE Referring Phys: [id number] [place]  1. Left ventricular ejection fraction, by estimation, is 55 to 60%. The left ventricle has normal function. The left ventricle has no regional wall motion abnormalities. Left ventricular diastolic parameters were normal.  2. Right ventricular systolic function is normal. The right ventricular size is normal.  3. The mitral valve is normal in structure. Trivial mitral valve regurgitation. No evidence of mitral stenosis.  4. The aortic valve is normal in structure. Aortic valve regurgitation is not visualized. No aortic stenosis is present.  5. The inferior vena cava is dilated in size with >50% respiratory variability, suggesting right atrial pressure of 8 mmHg. FINDINGS  Left Ventricle: Left ventricular ejection fraction, by estimation, is 55 to 60%. The left ventricle has normal function. The left ventricle has no regional wall motion abnormalities. The left ventricular internal cavity size was normal in size. There is  no left ventricular hypertrophy. Left ventricular diastolic parameters were normal. Right Ventricle: The right ventricular size is normal. No increase in right ventricular wall thickness. Right ventricular systolic function is normal.  Left Atrium: Left atrial size was normal in size. Right Atrium: Right atrial size was normal in size. Pericardium: There is no evidence of pericardial effusion. Mitral Valve: The mitral valve is normal in structure. Trivial mitral valve regurgitation. No evidence of mitral valve stenosis. Tricuspid Valve: The tricuspid valve is normal in structure. Tricuspid valve regurgitation is not demonstrated. No evidence of tricuspid stenosis. Aortic Valve: The aortic valve is normal in structure. Aortic valve regurgitation is not visualized. No aortic stenosis is present. Aortic valve mean gradient measures 2.0 mmHg. Aortic valve peak gradient measures 4.6 mmHg. Aortic valve area, by VTI measures 1.80 cm. Pulmonic Valve: The pulmonic valve was normal in structure. Pulmonic valve regurgitation is not visualized. No evidence of pulmonic stenosis. Aorta: The aortic root is normal in size and structure. Venous: The inferior vena cava is dilated in size with greater than 50% respiratory variability, suggesting right atrial pressure of 8 mmHg. IAS/Shunts: No atrial level shunt detected by color flow Doppler.  LEFT VENTRICLE PLAX 2D LVIDd:         4.70 cm   Diastology LVIDs:         3.20 cm   LV e' medial:    10.60 cm/s LV PW:         0.80 cm   LV E/e' medial:  7.3 LV IVS:        0.70 cm   LV e' lateral:   12.90 cm/s LVOT diam:     1.70 cm   LV E/e' lateral: 6.0 LV SV:         41 LV SV Index:   27 LVOT Area:     2.27 cm  RIGHT VENTRICLE RV S prime:     11.40 cm/s TAPSE (M-mode): 1.8 cm LEFT ATRIUM           Index        RIGHT ATRIUM           Index LA Vol (A2C): 34.4 ml 22.44 ml/m  RA Area:     14.10 cm LA Vol (A4C): 30.6 ml 19.96 ml/m  RA Volume:   35.80 ml  23.35 ml/m  AORTIC VALVE AV Area (Vmax):    1.99 cm AV Area (Vmean):   1.90 cm AV Area (VTI):     1.80 cm AV Vmax:  107.00 cm/s AV Vmean:          71.200 cm/s AV VTI:            0.229 m AV Peak Grad:      4.6 mmHg AV Mean Grad:      2.0 mmHg LVOT Vmax:          93.80 cm/s LVOT Vmean:        59.700 cm/s LVOT VTI:          0.182 m LVOT/AV VTI ratio: 0.79  AORTA Ao Root diam: 2.80 cm Ao Asc diam:  2.60 cm MITRAL VALVE               TRICUSPID VALVE MV Area (PHT): 3.77 cm    TR Peak grad:   5.3 mmHg MV Decel Time: 201 msec    TR Vmax:        115.00 cm/s MV E velocity: 77.60 cm/s MV A velocity: 51.90 cm/s  SHUNTS MV E/A ratio:  1.50        Systemic VTI:  0.18 m                            Systemic Diam: 1.70 cm Glori Bickers MD Electronically signed by Glori Bickers MD Signature Date/Time: 10/14/2022/1:29:34 PM    Final    CT Angio Chest PE W and/or Wo Contrast  Result Date: 10/13/2022 CLINICAL DATA:  Pulmonary embolism suspected. Low to intermediate probability. Negative D-dimer. Tachycardia and palpitations with shortness of breath. EXAM: CT ANGIOGRAPHY CHEST WITH CONTRAST TECHNIQUE: Multidetector CT imaging of the chest was performed using the standard protocol during bolus administration of intravenous contrast. Multiplanar CT image reconstructions and MIPs were obtained to evaluate the vascular anatomy. RADIATION DOSE REDUCTION: This exam was performed according to the departmental dose-optimization program which includes automated exposure control, adjustment of the mA and/or kV according to patient size and/or use of iterative reconstruction technique. CONTRAST:  85mL ISOVUE-370 IOPAMIDOL (ISOVUE-370) INJECTION 76% COMPARISON:  Portable chest today, chest radiograph 03/08/2016, abdomen and pelvis CT 09/28/2019. No prior chest CT. FINDINGS: Cardiovascular: The cardiac size is normal. There is a small pericardial effusion anteriorly. The pulmonary arteries and veins are normal caliber. No arterial embolism is seen. The aorta and great vessels are normal. Mediastinum/Nodes: No enlarged mediastinal, hilar, or axillary lymph nodes. The lower poles of the thyroid gland, trachea, and esophagus demonstrate no significant findings. Lungs/Pleura: No pleural effusion,  thickening or pneumothorax is seen. There are paraseptal emphysematous changes in the upper lobes including paramediastinal blebs anteriorly. Subpleural scar-like opacities are present in both apices. There is a 4 cm thin walled bulla in the right upper lobe apex. On the left, there are perihilar coarse interstitial and underlying cystic changes in the upper lobe mid perihilar area and in the superomedial aspect of the superior segment of the lower lobe. In the left upper lobe suprahilar area there is a 2.1 x 1.7 cm irregular cavitary focus containing a small amount of fluid, with the cavity measuring 2.1 x 1.7 cm on 6:55. A smaller cavitary lesion with irregular wall thickening in the left upper lobe just below this level measures 1.3 x 1 cm on 6:62. There are additional subpleural cystic changes and surrounding scarring in the posterior extreme left lower lobe base, and focally in the lateral basal segment of the right lower lobe, both seen previously. No confluent pneumonia or further parenchymal abnormality are noted. Upper Abdomen: No acute  abnormality. Musculoskeletal: No chest wall abnormality. No acute or significant osseous findings. Review of the MIP images confirms the above findings. IMPRESSION: 1. No evidence of arterial dilatation or embolus. 2. Small pericardial effusion. 3. Upper lobe paraseptal emphysematous changes with 4 cm thin walled bulla in the right upper lobe apex. 4. Left perihilar coarse interstitial and underlying cystic changes, with 2 irregular cavitary lesions in the left upper lobe, the larger measuring 2.1 x 1.7 cm with a small amount of fluid in the larger cavity. Findings are most likely due to an atypical infectious process, with neoplasm not completely excluded. Consider fungal infection or atypical mycobacterial disease. Other etiologies are possible. Three to six-month follow-up chest CT recommended. 5. Additional subpleural cystic changes and surrounding scarring in the  posterior extreme left lower lobe base and focally in the lateral basal segment of the right lower lobe, both seen previously. Emphysema (ICD10-J43.9). Electronically Signed   By: Telford Nab M.D.   On: 10/13/2022 20:51   DG Chest Portable 1 View  Result Date: 10/13/2022 CLINICAL DATA:  Shortness of breath. EXAM: PORTABLE CHEST 1 VIEW COMPARISON:  Chest radiograph dated 03/08/2016. FINDINGS: The heart size and mediastinal contours are within normal limits. Both lungs are clear. The visualized skeletal structures are unremarkable. IMPRESSION: No active disease. Electronically Signed   By: Anner Crete M.D.   On: 10/13/2022 18:23       Discharge Exam: Vitals:   10/15/22 0440 10/15/22 0727  BP: (!) 97/55 (!) 93/57  Pulse:  71  Resp:  18  Temp:  98.5 F (36.9 C)  SpO2:  97%    General: Pt is alert, awake, not in acute distress Cardiovascular: RRR, S1/S2 +, no edema Respiratory: CTA bilaterally, no wheezing, no rhonchi, no respiratory distress, no conversational dyspnea  Abdominal: Soft, NT, ND, bowel sounds + Extremities: no edema, no cyanosis Psych: Normal mood and affect, stable judgement and insight     The results of significant diagnostics from this hospitalization (including imaging, microbiology, ancillary and laboratory) are listed below for reference.     Microbiology: No results found for this or any previous visit (from the past 240 hour(s)).   Labs: BNP (last 3 results) No results for input(s): "BNP" in the last 8760 hours. Basic Metabolic Panel: Recent Labs  Lab 10/13/22 1617 10/14/22 0404  NA 137 135  K 3.3* 3.9  CL 102 107  CO2 23 23  GLUCOSE 84 81  BUN 8 8  CREATININE 0.56 0.58  CALCIUM 8.8* 8.0*  MG  --  1.8  PHOS  --  3.2   Liver Function Tests: Recent Labs  Lab 10/13/22 1617  AST 21  ALT 11  ALKPHOS 50  BILITOT 0.8  PROT 6.9  ALBUMIN 3.9   No results for input(s): "LIPASE", "AMYLASE" in the last 168 hours. No results for input(s):  "AMMONIA" in the last 168 hours. CBC: Recent Labs  Lab 10/13/22 1617 10/14/22 0404  WBC 7.3 5.1  NEUTROABS 4.3  --   HGB 13.3 10.5*  HCT 39.3 31.6*  MCV 93.1 94.6  PLT 359 300   Cardiac Enzymes: No results for input(s): "CKTOTAL", "CKMB", "CKMBINDEX", "TROPONINI" in the last 168 hours. BNP: Invalid input(s): "POCBNP" CBG: No results for input(s): "GLUCAP" in the last 168 hours. D-Dimer Recent Labs    10/13/22 1701  DDIMER <0.27   Hgb A1c No results for input(s): "HGBA1C" in the last 72 hours. Lipid Profile No results for input(s): "CHOL", "HDL", "LDLCALC", "TRIG", "CHOLHDL", "LDLDIRECT"  in the last 72 hours. Thyroid function studies Recent Labs    10/13/22 1617  TSH 4.392   Anemia work up No results for input(s): "VITAMINB12", "FOLATE", "FERRITIN", "TIBC", "IRON", "RETICCTPCT" in the last 72 hours. Urinalysis    Component Value Date/Time   COLORURINE STRAW (A) 10/13/2022 1835   APPEARANCEUR CLEAR 10/13/2022 1835   LABSPEC 1.005 10/13/2022 1835   PHURINE 7.0 10/13/2022 [place] 10/13/2022 [place] 10/13/2022 [place] 10/13/2022 1835   KETONESUR 5 (A) 10/13/2022 1835   PROTEINUR NEGATIVE 10/13/2022 1835   UROBILINOGEN 1.0 07/12/2020 1344   NITRITE NEGATIVE 10/13/2022 1835   LEUKOCYTESUR NEGATIVE 10/13/2022 1835   Sepsis Labs Recent Labs  Lab 10/13/22 1617 10/14/22 0404  WBC 7.3 5.1   Microbiology No results found for this or any previous visit (from the past 240 hour(s)).   Patient was seen and examined on the day of discharge and was found to be in stable condition. Time coordinating discharge: 25 minutes including assessment and coordination of care, as well as examination of the patient.   SIGNED:  Dessa Phi, DO [facility] 10/15/2022, 12:38 PM

## 2022-10-16 ENCOUNTER — Telehealth (INDEPENDENT_AMBULATORY_CARE_PROVIDER_SITE_OTHER): Payer: Self-pay

## 2022-10-16 NOTE — Transitions of Care (Post Inpatient/ED Visit) (Signed)
   10/16/2022  Name: Tara [name] MRN: [id number] DOB: [birth year]  Today's TOC FU Call Status: Today's TOC FU Call Status:: Successful TOC FU Call Competed TOC FU Call Complete Date: 10/16/22  Transition Care Management Follow-up Telephone Call Date of Discharge: 10/15/22 Discharge Facility[facility]) Type of Discharge: Inpatient Admission Primary Inpatient Discharge Diagnosis:: Cavitary lesion of lung How have you been since you were released from the hospital?: Better Any questions or concerns?: No  Items Reviewed: Did you receive and understand the discharge instructions provided?: Yes Medications obtained and verified?: Yes (Medications Reviewed) Any new allergies since your discharge?: No Dietary orders reviewed?: Yes Do you have support at home?: Yes People in Home: significant other  Home Care and Equipment/Supplies: [place] Ordered?: No Any new equipment or medical supplies ordered?: No  Functional Questionnaire: Do you need assistance with bathing/showering or dressing?: No Do you need assistance with meal preparation?: No Do you need assistance with eating?: No Do you have difficulty maintaining continence: No Do you need assistance with getting out of bed/getting out of a chair/moving?: No Do you have difficulty managing or taking your medications?: No  Follow up appointments reviewed: PCP Follow-up appointment confirmed?: Yes Date of PCP follow-up appointment?: 11/01/22 Follow-up Provider: Juluis Mire NP [facility] Follow-up appointment confirmed?: Yes Date of Specialist follow-up appointment?: 11/18/22 Follow-Up Specialty Provider:: Coletta Memos NP and 11-27-22 with Dr Verlee Monte Do you need transportation to your follow-up appointment?: No Do you understand care options if your condition(s) worsen?: Yes-patient verbalized understanding    [place] Direct Dial [phone]

## 2022-10-17 DIAGNOSIS — R002 Palpitations: Secondary | ICD-10-CM | POA: Diagnosis not present

## 2022-10-21 ENCOUNTER — Ambulatory Visit (INDEPENDENT_AMBULATORY_CARE_PROVIDER_SITE_OTHER): Payer: Self-pay | Admitting: *Deleted

## 2022-10-21 NOTE — Telephone Encounter (Signed)
yeast infection advice   Pt is calling to ask how to get rid of yeast infection after taking Medication amoxicillin-clavulanate (AUGMENTIN) 875-125 MG tablet [id number]? [facility] [place]      Chief Complaint: VAginal Discharge Symptoms: On antibiotics. States now with "Yeast infection, always get this on antibiotics." Thick whitish discharge, "A lot" vaginal itching. Frequency: 2 days Pertinent Negatives: Patient denies pain, rash Disposition: [] ED /[] Urgent Care (no appt availability in office) / [] Appointment(In office/virtual)/ []  [facility]/ [] Home Care/ [] Refused Recommended Disposition /[] [facility]/ [x]  Follow-up with PCP Additional Notes: Pt requesting med called in. Advised may need appt. Is still on antibiotic. [facility] [address]  Please advise. Thank you. Reason for Disposition  [1] Symptoms of a "yeast infection" (i.e., itchy, white discharge, not bad smelling) AND [2] not improved > 3 days following Care Advice  Answer Assessment - Initial Assessment Questions 1. DISCHARGE: "Describe the discharge." (e.g., white, yellow, green, gray, foamy, cottage cheese-like)     White thick discharge 2. ODOR: "Is there a bad odor?"     *No Answer* 3. ONSET: "When did the discharge begin?"     2 days ok, worsening 4. RASH: "Is there a rash in the genital area?" If Yes, ask: "Describe it." (e.g., redness, blisters, sores, bumps)     no 5. ABDOMEN PAIN: "Are you having any abdomen pain?" If Yes, ask: "What does it feel like? " (e.g., crampy, dull, intermittent, constant)      no 6. ABDOMEN PAIN SEVERITY: If present, ask: "How bad is it?" (e.g., Scale 1-10; mild, moderate, or severe)   - MILD (1-3): Doesn't interfere with normal activities, abdomen soft and not tender to touch.    - MODERATE (4-7): Interferes with normal activities or awakens from sleep, abdomen tender to touch.    - SEVERE (8-10): Excruciating pain, doubled over, unable  to do any normal activities. (R/O peritonitis)      NA 7. CAUSE: "What do you think is causing the discharge?" "Have you had the same problem before? What happened then?"     ATBs 8. OTHER SYMPTOMS: "Do you have any other symptoms?" (e.g., fever, itching, vaginal bleeding, pain with urination, injury to genital area, vaginal foreign body)     Itching starting  Protocols used: Vaginal Discharge-A-AH

## 2022-10-21 NOTE — Telephone Encounter (Signed)
Will forward to provider  

## 2022-10-22 ENCOUNTER — Other Ambulatory Visit (INDEPENDENT_AMBULATORY_CARE_PROVIDER_SITE_OTHER): Payer: Self-pay | Admitting: Primary Care

## 2022-10-22 MED ORDER — FLUCONAZOLE 150 MG PO TABS: 150.0000 mg | ORAL_TABLET | Freq: Every day | ORAL | 1 refills | Status: DC

## 2022-10-22 NOTE — Telephone Encounter (Signed)
Reviewed pt on abt's for 21 days will sent in 

## 2022-10-22 NOTE — Telephone Encounter (Signed)
Contacted pt and made aware  

## 2022-11-01 ENCOUNTER — Ambulatory Visit (INDEPENDENT_AMBULATORY_CARE_PROVIDER_SITE_OTHER): Payer: 59 | Admitting: Primary Care

## 2022-11-01 ENCOUNTER — Encounter (INDEPENDENT_AMBULATORY_CARE_PROVIDER_SITE_OTHER): Payer: Self-pay | Admitting: Primary Care

## 2022-11-01 VITALS — BP 109/66 | HR 71 | Ht 62.0 in | Wt 119.2 lb

## 2022-11-01 DIAGNOSIS — Z7689 Persons encountering health services in other specified circumstances: Secondary | ICD-10-CM | POA: Diagnosis not present

## 2022-11-01 DIAGNOSIS — Z09 Encounter for follow-up examination after completed treatment for conditions other than malignant neoplasm: Secondary | ICD-10-CM

## 2022-11-01 DIAGNOSIS — F411 Generalized anxiety disorder: Secondary | ICD-10-CM

## 2022-11-01 DIAGNOSIS — R0600 Dyspnea, unspecified: Secondary | ICD-10-CM | POA: Diagnosis not present

## 2022-11-01 MED ORDER — BUSPIRONE HCL 5 MG PO TABS: 5.0000 mg | ORAL_TABLET | Freq: Two times a day (BID) | ORAL | 1 refills | Status: DC

## 2022-11-01 NOTE — Progress Notes (Unsigned)
[facility]   Subjective:  [name] is a 37 y.o. female presents for hospital follow up and establish care. Patient presents to the emergency room for palpitations and shortness of breath.. Admit date to the hospital was 10/13/22, patient was discharged from the hospital on 10/15/22, patient was admitted for: Dyspnea, Cavitary lesion of lung, Palpitation and Sinus tachycardia. Patient has No headache, No chest pain, No abdominal pain - No Nausea, No new weakness tingling or numbness, No Cough - shortness of breath. Past Medical History:  Diagnosis Date   Headache(784.0)    Heart murmur    as baby   History of chlamydia    History of gonorrhea    Preterm labor    Urinary tract infection      No Known Allergies    Current Outpatient Medications on File Prior to Visit  Medication Sig Dispense Refill   amoxicillin-clavulanate (AUGMENTIN) 875-125 MG tablet Take 1 tablet by mouth 2 (two) times daily for 21 days. (Patient not taking: Reported on 11/01/2022) 42 tablet 0   fluconazole (DIFLUCAN) 150 MG tablet Take 1 tablet (150 mg total) by mouth daily. (Patient not taking: Reported on 11/01/2022) 1 tablet 1   ibuprofen (ADVIL) 400 MG tablet Take 1 tablet (400 mg total) by mouth every 8 (eight) hours as needed. (Patient not taking: Reported on 11/01/2022) 90 tablet 1   No current facility-administered medications on file prior to visit.     Review of System: Comprehensive ROS Pertinent positive and negative noted in HPI    Objective:  Blood Pressure 109/66 (BP Location: Right Arm, Patient Position: Sitting, Cuff Size: Normal)   Pulse 71   Height 5\' 2"  (1.575 m)   Weight 119 lb 3.2 oz (54.1 kg)   Last Menstrual Period 09/20/2022 (Approximate)   Oxygen Saturation (Abnormal) 71%   Body Mass Index 21.80 kg/m   Filed Weights   11/01/22 0958  Weight: 119 lb 3.2 oz (54.1 kg)    Physical Exam: General Appearance: Well nourished, in no apparent distress. Eyes:  PERRLA, EOMs, conjunctiva no swelling or erythema Sinuses: No Frontal/maxillary tenderness ENT/Mouth: Ext aud canals clear, TMs without erythema, bulging. No erythema, swelling, or exudate on post pharynx.  Tonsils not swollen or erythematous. Hearing normal.  Neck: Supple, thyroid normal.  Respiratory: Respiratory effort normal, BS equal bilaterally without rales, rhonchi, wheezing or stridor.  Cardio: RRR with no MRGs. Brisk peripheral pulses without edema.  Abdomen: Soft, + BS.  Non tender, no guarding, rebound, hernias, masses. Lymphatics: Non tender without lymphadenopathy.  Musculoskeletal: Full ROM, 5/5 strength, normal gait.  Skin: Warm, dry without rashes, lesions, ecchymosis.  Neuro: Cranial nerves intact. Normal muscle tone, no cerebellar symptoms. Sensation intact.  Psych: Awake and oriented X 3, normal affect, Insight and Judgment appropriate.    Assessment:  Tara [name] was seen today for hospitalization follow-up.  Diagnoses and all orders for this visit:  Encounter to establish care  Dyspnea, unspecified type  Probable cause Cavitary lesion of lung   Hospital discharge follow-up Go to Ronney Asters, NP (Cardiology) on 11/18/2022 Go to Omar Person, MD (Pulmonary Disease) on 11/27/2022 Go to [name], NP (Internal Medicine) on 11/01/2022; @9 :30am  Generalized anxiety disorder 2/2 Other orders   busPIRone (BUSPAR) 5 MG tablet; Take 1 tablet (5 mg total) by mouth 2 (two) times daily.  -        This note has been created with Education officer, environmental. Any transcriptional errors  are unintentional.   [name], NP 11/01/2022, 10:09 AM

## 2022-11-01 NOTE — Progress Notes (Unsigned)
Pain no

## 2022-11-01 NOTE — Patient Instructions (Signed)
Buspirone Tablets What is this medication? BUSPIRONE (byoo SPYE rone) treats anxiety. It works by balancing the levels of dopamine and serotonin in your brain, substances that help regulate mood. This medicine may be used for other purposes; ask your health care provider or pharmacist if you have questions. COMMON BRAND NAME(S): BuSpar, Buspar Dividose What should I tell my care team before I take this medication? They need to know if you have any of these conditions: Kidney or liver disease An unusual or allergic reaction to buspirone, other medications, foods, dyes, or preservatives Pregnant or trying to get pregnant Breast-feeding How should I use this medication? Take this medication by mouth with a glass of water. Follow the directions on the prescription label. You may take this medication with or without food. To ensure that this medication always works the same way for you, you should take it either always with or always without food. Take your doses at regular intervals. Do not take your medication more often than directed. Do not stop taking except on the advice of your care team. Talk to your care team about the use of this medication in children. Special care may be needed. Overdosage: If you think you have taken too much of this medicine contact a poison control center or emergency room at once. NOTE: This medicine is only for you. Do not share this medicine with others. What if I miss a dose? If you miss a dose, take it as soon as you can. If it is almost time for your next dose, take only that dose. Do not take double or extra doses. What may interact with this medication? Do not take this medication with any of the following: Linezolid MAOIs like Carbex, Eldepryl, Marplan, Nardil, and Parnate Methylene blue Procarbazine This medication may also interact with the following: Diazepam Digoxin Diltiazem Erythromycin Grapefruit juice Haloperidol Medications for mental  depression or mood problems Medications for seizures like carbamazepine, phenobarbital and phenytoin Nefazodone Other medications for anxiety Rifampin Ritonavir Some antifungal medications like itraconazole, ketoconazole, and voriconazole Verapamil Warfarin This list may not describe all possible interactions. Give your health care provider a list of all the medicines, herbs, non-prescription drugs, or dietary supplements you use. Also tell them if you smoke, drink alcohol, or use illegal drugs. Some items may interact with your medicine. What should I watch for while using this medication? Visit your care team for regular checks on your progress. It may take 1 to 2 weeks before your anxiety gets better. This medication may affect your coordination, reaction time, or judgment. Do not drive or operate machinery until you know how this medication affects you. Sit up or stand slowly to reduce the risk of dizzy or fainting spells. Drinking alcohol with this medication can increase the risk of these side effects. What side effects may I notice from receiving this medication? Side effects that you should report to your care team as soon as possible: Allergic reactions--skin rash, itching, hives, swelling of the face, lips, tongue, or throat Irritability, confusion, fast or irregular heartbeat, muscle stiffness, twitching muscles, sweating, high fever, seizure, chills, vomiting, diarrhea, which may be signs of serotonin syndrome Side effects that usually do not require medical attention (report to your care team if they continue or are bothersome): Anxiety, nervousness Dizziness Drowsiness Headache Nausea Trouble sleeping This list may not describe all possible side effects. Call your doctor for medical advice about side effects. You may report side effects to FDA at 1-800-FDA-1088. Where should I keep   my medication? Keep out of the reach of children. Store at room temperature below 30 degrees C  (86 degrees F). Protect from light. Keep container tightly closed. Throw away any unused medication after the expiration date. NOTE: This sheet is a summary. It may not cover all possible information. If you have questions about this medicine, talk to your doctor, pharmacist, or health care provider.  2023 Elsevier/Gold Standard (2022-01-28 00:00:00)  

## 2022-11-15 NOTE — Progress Notes (Deleted)
Cardiology Office Note:    Date:  11/15/2022   ID:  Tara [name], DOB 02-15-1986, MRN [id number]  PCP:  Tara Sessions, NP   [facility] Providers Cardiologist:  Tara Batty, MD {  Referring MD: Tara Sessions, NP   No chief complaint on file. ***  History of Present Illness:    Tara [name] is a 37 y.o. female with a hx of intermittent palpitations. She was recently seen in urgent care on 3/24 complaining of palpitations that had started earlier that morning.  Also complained of some chest pain that radiated down left arm.  Urgent care sent her to the ED for further evaluation. Initial EKG in the ED showed sinus tachycardia with heart rate 134 bpm.  Labs in the ED significant for high-sensitivity troponin <2, 3.  Potassium 3.3, creatinine 0.56, WBC 7.3, hemoglobin 13.3, platelets 359.  TSH within normal limits at 4.392. Chest x-ray showed no active disease.  CTA chest showed no evidence of arterial dilation or embolus, small pericardial effusion, upper load paraseptal emphysematous changes with 4 cm thin-walled bulla in the right upper lobe apex.  Also showed 2 irregular cavitary lesions in the left upper lobe.  Patient was admitted to the internal medicine service. Cardiology was consulted for evaluation of tachycardia and palpitations. Echocardiogram 3/25 showed EF 55-60%, no regional wall motion abnormalities, normal RV systolic function. After discharge, patient wore a cardiac monitor that showed predominantly normal sinus rhythm with average HR of 87 BPM. There was only rare PACs and PVCs, and no arrhythmias were found.   Today, patient presents for follow up.   Palpitations - Patient was admitted to [facility] from 3/24-3/26/24 for evaluation of palpitations - Echocardiogram showed EF 55-60%, no regional wall motion abnormalities, normal RV systolic function.  - After DC, patient wore a 2 week Zio monitor that showed normal sinus rhythm with only rare  ectopy  Cavitary Lesions on Chest CT Emphysema - CTA chest from recent admission showed upper lobe paraseptal emphysematous changes with 4 cm thin-walled bulla in the right upper lobe apex.  Also showed 2 irregular cavitary lesions in the left upper lobe. - Repeat CT scheduled for 5/6 with pulm follow up on 5/8  Carotid Bruit  - Patient noted to have a soft carotid bruit on right side - Ordered carotid dopplers   Past Medical History:  Diagnosis Date   Headache(784.0)    Heart murmur    as baby   History of chlamydia    History of gonorrhea    Preterm labor    Urinary tract infection     Past Surgical History:  Procedure Laterality Date   DILATION AND CURETTAGE OF UTERUS     INDUCED ABORTION     LAPAROSCOPIC TUBAL LIGATION  01/09/2012   Procedure: LAPAROSCOPIC TUBAL LIGATION;  Surgeon: Kathreen Cosier, MD;  Location: [facility];  Service: Gynecology;  Laterality: Bilateral;   ORIF WRIST FRACTURE Left 01/15/2014   Procedure: OPEN REDUCTION INTERNAL FIXATION (ORIF) WRIST FRACTURE;  Surgeon: Sharma Covert, MD;  Location: [facility] OR;  Service: Orthopedics;  Laterality: Left;   TUBAL LIGATION      Current Medications: No outpatient medications have been marked as taking for the 11/18/22 encounter (Appointment) with Tara Asters, NP.     Allergies:   Patient has no known allergies.   Social History   Socioeconomic History   Marital status: Significant Other    Spouse name: Not on file   Number of children: Not  on file   Years of education: Not on file   Highest education level: 9th grade  Occupational History   Not on file  Tobacco Use   Smoking status: Some Days    Packs/day: .25    Types: Cigarettes   Smokeless tobacco: Never  Vaping Use   Vaping Use: Some days   Substances: Nicotine, Flavoring  Substance and Sexual Activity   Alcohol use: Not Currently   Drug use: No   Sexual activity: Yes    Birth control/protection: Surgical  Other Topics Concern   Not on  file  Social History Narrative   Not on file   Social Determinants of Health   Financial Resource Strain: Low Risk  (10/28/2022)   Overall Financial Resource Strain (CARDIA)    Difficulty of Paying Living Expenses: Not hard at all  Food Insecurity: No Food Insecurity (10/28/2022)   Hunger Vital Sign    Worried About Running Out of Food in the Last Year: Never true    Ran Out of Food in the Last Year: Never true  Transportation Needs: No Transportation Needs (10/28/2022)   PRAPARE - Administrator, Civil Service (Medical): No    Lack of Transportation (Non-Medical): No  Physical Activity: Insufficiently Active (10/28/2022)   Exercise Vital Sign    Days of Exercise per Week: 2 days    Minutes of Exercise per Session: 20 min  Stress: No Stress Concern Present (10/28/2022)   [employer] of Occupational Health - Occupational Stress Questionnaire    Feeling of Stress : Not at all  Social Connections: Moderately Isolated (10/28/2022)   Social Connection and Isolation Panel [NHANES]    Frequency of Communication with Friends and Family: More than three times a week    Frequency of Social Gatherings with Friends and Family: Once a week    Attends Religious Services: 1 to 4 times per year    Active Member of [employer] or Organizations: No    Attends Engineer, structural: Not on file    Marital Status: Separated     Family History: The patient's family history includes Breast cancer in her paternal aunt. There is no history of Anesthesia problems or Hearing loss.  ROS:   Please see the history of present illness.     All other systems reviewed and are negative.  EKGs/Labs/Other Studies Reviewed:    The following studies were reviewed today: Cardiac Studies & Procedures       ECHOCARDIOGRAM  ECHOCARDIOGRAM COMPLETE 10/14/2022  Narrative ECHOCARDIOGRAM REPORT    Patient Name:   Tara [name] Date of Exam: 10/14/2022 Medical Rec #:  [id number]       Height:        62.0 in Accession #:    [id number]      Weight:       119.0 lb Date of Birth:  1985/07/23       BSA:          1.533 m Patient Age:    36 years        BP:           105/83 mmHg Patient Gender: F               HR:           65 bpm. Exam Location:  Inpatient  Procedure: 2D Echo, Cardiac Doppler and Color Doppler  Indications:    Chest Pain  History:        Patient has no  prior history of Echocardiogram examinations.  Sonographer:    Melton Krebs RDCS, FE, PE Referring Phys: [id number] CAROLE N HALL  IMPRESSIONS   1. Left ventricular ejection fraction, by estimation, is 55 to 60%. The left ventricle has normal function. The left ventricle has no regional wall motion abnormalities. Left ventricular diastolic parameters were normal. 2. Right ventricular systolic function is normal. The right ventricular size is normal. 3. The mitral valve is normal in structure. Trivial mitral valve regurgitation. No evidence of mitral stenosis. 4. The aortic valve is normal in structure. Aortic valve regurgitation is not visualized. No aortic stenosis is present. 5. The inferior vena cava is dilated in size with >50% respiratory variability, suggesting right atrial pressure of 8 mmHg.  FINDINGS Left Ventricle: Left ventricular ejection fraction, by estimation, is 55 to 60%. The left ventricle has normal function. The left ventricle has no regional wall motion abnormalities. The left ventricular internal cavity size was normal in size. There is no left ventricular hypertrophy. Left ventricular diastolic parameters were normal.  Right Ventricle: The right ventricular size is normal. No increase in right ventricular wall thickness. Right ventricular systolic function is normal.  Left Atrium: Left atrial size was normal in size.  Right Atrium: Right atrial size was normal in size.  Pericardium: There is no evidence of pericardial effusion.  Mitral Valve: The mitral valve is normal in structure. Trivial  mitral valve regurgitation. No evidence of mitral valve stenosis.  Tricuspid Valve: The tricuspid valve is normal in structure. Tricuspid valve regurgitation is not demonstrated. No evidence of tricuspid stenosis.  Aortic Valve: The aortic valve is normal in structure. Aortic valve regurgitation is not visualized. No aortic stenosis is present. Aortic valve mean gradient measures 2.0 mmHg. Aortic valve peak gradient measures 4.6 mmHg. Aortic valve area, by VTI measures 1.80 cm.  Pulmonic Valve: The pulmonic valve was normal in structure. Pulmonic valve regurgitation is not visualized. No evidence of pulmonic stenosis.  Aorta: The aortic root is normal in size and structure.  Venous: The inferior vena cava is dilated in size with greater than 50% respiratory variability, suggesting right atrial pressure of 8 mmHg.  IAS/Shunts: No atrial level shunt detected by color flow Doppler.   LEFT VENTRICLE PLAX 2D LVIDd:         4.70 cm   Diastology LVIDs:         3.20 cm   LV e' medial:    10.60 cm/s LV PW:         0.80 cm   LV E/e' medial:  7.3 LV IVS:        0.70 cm   LV e' lateral:   12.90 cm/s LVOT diam:     1.70 cm   LV E/e' lateral: 6.0 LV SV:         41 LV SV Index:   27 LVOT Area:     2.27 cm   RIGHT VENTRICLE RV S prime:     11.40 cm/s TAPSE (M-mode): 1.8 cm  LEFT ATRIUM           Index        RIGHT ATRIUM           Index LA Vol (A2C): 34.4 ml 22.44 ml/m  RA Area:     14.10 cm LA Vol (A4C): 30.6 ml 19.96 ml/m  RA Volume:   35.80 ml  23.35 ml/m AORTIC VALVE AV Area (Vmax):    1.99 cm AV Area (Vmean):   1.90 cm AV  Area (VTI):     1.80 cm AV Vmax:           107.00 cm/s AV Vmean:          71.200 cm/s AV VTI:            0.229 m AV Peak Grad:      4.6 mmHg AV Mean Grad:      2.0 mmHg LVOT Vmax:         93.80 cm/s LVOT Vmean:        59.700 cm/s LVOT VTI:          0.182 m LVOT/AV VTI ratio: 0.79  AORTA Ao Root diam: 2.80 cm Ao Asc diam:  2.60 cm  MITRAL VALVE                TRICUSPID VALVE MV Area (PHT): 3.77 cm    TR Peak grad:   5.3 mmHg MV Decel Time: 201 msec    TR Vmax:        115.00 cm/s MV E velocity: 77.60 cm/s MV A velocity: 51.90 cm/s  SHUNTS MV E/A ratio:  1.50        Systemic VTI:  0.18 m Systemic Diam: 1.70 cm  Arvilla Meres MD Electronically signed by Arvilla Meres MD Signature Date/Time: 10/14/2022/1:29:34 PM    Final    MONITORS  LONG TERM MONITOR (3-14 DAYS) 11/10/2022  Narrative Patch Wear Time:  8 days and 0 hours (2024-03-28T12:28:49-0400 to 2024-04-05T13:28:44-0400)  Patient had a min HR of 55 bpm, max HR of 154 bpm, and avg HR of 87 bpm. Predominant underlying rhythm was Sinus Rhythm. Isolated SVEs were rare (<1.0%), and no SVE Couplets or SVE Triplets were present. Isolated VEs were rare (<1.0%), and no VE Couplets or VE Triplets were present.  SR/SB/ST Occasional PAC's/PVC's No arrhthymias found            EKG:  EKG is *** ordered today.  The ekg ordered today demonstrates ***  Recent Labs: 10/13/2022: ALT 11; TSH 4.392 10/14/2022: BUN 8; Creatinine, Ser 0.58; Hemoglobin 10.5; Magnesium 1.8; Platelets 300; Potassium 3.9; Sodium 135  Recent Lipid Panel No results found for: "CHOL", "TRIG", "HDL", "CHOLHDL", "VLDL", "LDLCALC", "LDLDIRECT"   Risk Assessment/Calculations:   {Does this patient have ATRIAL FIBRILLATION?[phone]}  No BP recorded.  {Refresh Note OR Click here to enter BP  :1}***         Physical Exam:    VS:  There were no vitals taken for this visit.    Wt Readings from Last 3 Encounters:  11/01/22 119 lb 3.2 oz (54.1 kg)  10/15/22 115 lb (52.2 kg)  09/12/22 117 lb 9.6 oz (53.3 kg)     GEN: *** Well nourished, well developed in no acute distress HEENT: Normal NECK: No JVD; No carotid bruits LYMPHATICS: No lymphadenopathy CARDIAC: ***RRR, no murmurs, rubs, gallops RESPIRATORY:  Clear to auscultation without rales, wheezing or rhonchi  ABDOMEN: Soft, non-tender,  non-distended MUSCULOSKELETAL:  No edema; No deformity  SKIN: Warm and dry NEUROLOGIC:  Alert and oriented x 3 PSYCHIATRIC:  Normal affect   ASSESSMENT:    No diagnosis found. PLAN:    In order of problems listed above:  ***      {Are you ordering a CV Procedure (e.g. stress test, cath, DCCV, TEE, etc)?   Press F2        :[id number]}    Medication Adjustments/Labs and Tests Ordered: Current medicines are reviewed at length with the patient today.  Concerns regarding  medicines are outlined above.  No orders of the defined types were placed in this encounter.  No orders of the defined types were placed in this encounter.   There are no Patient Instructions on file for this visit.   Signed, Jonita Albee, PA-C  11/15/2022 3:17 PM    [facility]

## 2022-11-18 ENCOUNTER — Ambulatory Visit: Payer: 59 | Attending: General Practice | Admitting: General Practice

## 2022-11-19 ENCOUNTER — Emergency Department (HOSPITAL_COMMUNITY)
Admission: EM | Admit: 2022-11-19 | Discharge: 2022-11-19 | Disposition: A | Discharge status: Home / Self Care | Payer: 59 | Attending: Emergency Medicine | Admitting: Emergency Medicine

## 2022-11-19 ENCOUNTER — Other Ambulatory Visit: Payer: Self-pay

## 2022-11-19 ENCOUNTER — Encounter (HOSPITAL_COMMUNITY): Payer: Self-pay

## 2022-11-19 DIAGNOSIS — M546 Pain in thoracic spine: Secondary | ICD-10-CM | POA: Diagnosis not present

## 2022-11-19 DIAGNOSIS — M62830 Muscle spasm of back: Secondary | ICD-10-CM | POA: Diagnosis not present

## 2022-11-19 DIAGNOSIS — M62838 Other muscle spasm: Secondary | ICD-10-CM

## 2022-11-19 DIAGNOSIS — R9431 Abnormal electrocardiogram [ECG] [EKG]: Secondary | ICD-10-CM | POA: Diagnosis not present

## 2022-11-19 DIAGNOSIS — M549 Dorsalgia, unspecified: Secondary | ICD-10-CM

## 2022-11-19 LAB — POC URINE PREG, ED: Preg Test, Ur: NEGATIVE

## 2022-11-19 MED ORDER — LIDOCAINE 5 % EX PTCH
1.0000 | MEDICATED_PATCH | CUTANEOUS | Status: DC
  Administered 2022-11-19: 1 via TRANSDERMAL
  Filled 2022-11-19: qty 1

## 2022-11-19 MED ORDER — TIZANIDINE HCL 4 MG PO TABS: 4.0000 mg | ORAL_TABLET | Freq: Three times a day (TID) | ORAL | 0 refills | Status: DC | PRN

## 2022-11-19 MED ORDER — DIAZEPAM 5 MG PO TABS
5.0000 mg | ORAL_TABLET | Freq: Once | ORAL | Status: AC
  Administered 2022-11-19: 5 mg via ORAL
  Filled 2022-11-19: qty 1

## 2022-11-19 MED ORDER — KETOROLAC TROMETHAMINE 60 MG/2ML IM SOLN
30.0000 mg | Freq: Once | INTRAMUSCULAR | Status: AC
  Administered 2022-11-19: 30 mg via INTRAMUSCULAR
  Filled 2022-11-19: qty 2

## 2022-11-19 NOTE — ED Provider Notes (Signed)
[place] EMERGENCY DEPARTMENT AT [facility] Provider Note   CSN: [id number] Arrival date & time: 11/19/22  4098     History Chief Complaint  Patient presents with   Shoulder Pain    Tara [name] is a 37 y.o. female with h/o cavitary lesion of the lung presents emergency room today for evaluation of right-sided neck pain going into her upper right back and shoulder.  Patient reports has been going on for the past 2 days.  The night before this started hurting, she was playing golf at [employer] and woke up with the pain the next morning.  She denies any numbness or tingling.  Denies any chest pain or shortness of breath.  No nausea, vomiting, lightheadedness, near syncope, headaches, or blurry vision.  She has not tried any medication for this.  Reports a history of tubal ligation.   Shoulder Pain Associated symptoms: back pain and neck pain   Associated symptoms: no fever        Home Medications Prior to Admission medications   Medication Sig Start Date End Date Taking? Authorizing Provider  busPIRone (BUSPAR) 5 MG tablet Take 1 tablet (5 mg total) by mouth 2 (two) times daily. 11/01/22   [name], NP  fluconazole (DIFLUCAN) 150 MG tablet Take 1 tablet (150 mg total) by mouth daily. Patient not taking: Reported on 11/01/2022 10/22/22   [name], NP  ibuprofen (ADVIL) 400 MG tablet Take 1 tablet (400 mg total) by mouth every 8 (eight) hours as needed. Patient not taking: Reported on 11/01/2022 08/28/21   [name], NP      Allergies    Patient has no known allergies.    Review of Systems   Review of Systems  Constitutional:  Negative for chills and fever.  Respiratory:  Negative for shortness of breath.   Cardiovascular:  Negative for chest pain.  Gastrointestinal:  Negative for abdominal pain, nausea and vomiting.  Musculoskeletal:  Positive for arthralgias, back pain and neck pain.  Neurological:  Negative for syncope, weakness,  numbness and headaches.    Physical Exam Updated Vital Signs BP 105/67   Pulse 85   Temp 98.2 F (36.8 C) (Oral)   Resp 14   Ht 5\' 2"  (1.575 m)   Wt 53.5 kg   SpO2 98%   BMI 21.58 kg/m  Physical Exam Vitals and nursing note reviewed.  Constitutional:      General: She is not in acute distress.    Appearance: Normal appearance. She is not toxic-appearing.  Eyes:     General: No scleral icterus. Neck:     Comments: Patient still does have full range of motion however with pain.  No nuchal rigidity.  Palpable muscle spasm.  Please see MSK. Cardiovascular:     Rate and Rhythm: Normal rate.  Pulmonary:     Effort: Pulmonary effort is normal. No respiratory distress.  Musculoskeletal:       Back:     Comments: Palpable muscle spasm on the right with tenderness.  No midline tenderness.. Old tattoos, but otherwise no overlying skin changes, erythema, warmth, induration, fluctuation, or rash noted to the area.  Palpable radial pulses that are symmetric.  Reported sensations intact symmetrically as well.  Strength intact bilaterally.  Skin:    General: Skin is dry.     Findings: No rash.  Neurological:     General: No focal deficit present.     Mental Status: She is alert. Mental status is  at baseline.  Psychiatric:        Mood and Affect: Mood normal.     ED Results / Procedures / Treatments   Labs (all labs ordered are listed, but only abnormal results are displayed) Labs Reviewed  POC URINE PREG, ED    EKG None  Radiology No results found.  Procedures Procedures   Medications Ordered in ED Medications  diazepam (VALIUM) tablet 5 mg (has no administration in time range)  ketorolac (TORADOL) injection 30 mg (has no administration in time range)    ED Course/ Medical Decision Making/ A&P Clinical Course as of 11/19/22 1057  Tue Nov 19, 2022  1018 On reevaluation, patient reports that she is feeling better. [RR]    Clinical Course User Index [RR] Achille Rich, PA-C                           Medical Decision Making Risk Prescription drug management.   37 y.o. female presents to the ER for evaluation of right shoulder and back/neck pain. Differential diagnosis includes but is not limited to fracture, muscle spasm, basis infection, meningitis. Vital signs mildly low blood pressure otherwise unremarkable. Physical exam as noted above.   Patient has palpable muscle spasm along the trapezius on the right side.  She has no nuchal rigidity, doubt any meningitis.  She is afebrile and nontachycardic.  She denies any chest pain or any shortness of breath.  Pain is reproducible upon palpation.  Will give her some Valium and Toradol and reevaluate afterwards.    I independently reviewed and interpreted the patient's labs.  Pregnancy test is negative.  On reevaluation, patient reports that she is feeling much better.  Will discharge home with muscle laxer and recommend Tylenol ibuprofen as needed for pain.   We discussed the results of the labs. The plan is muscle laxer's,RICE, supportive care with pain control. We discussed strict return precautions and red flag symptoms. The patient verbalized their understanding and agrees to the plan. The patient is stable and being discharged home in good condition.  Portions of this report may have been transcribed using voice recognition software. Every effort was made to ensure accuracy; however, inadvertent computerized transcription errors may be present.   Final Clinical Impression(s) / ED Diagnoses Final diagnoses:  Muscle spasm  Upper back pain on right side    Rx / DC Orders ED Discharge Orders          Ordered    tiZANidine (ZANAFLEX) 4 MG tablet  Every 8 hours PRN        11/19/22 1100              Achille Rich, PA-C 11/19/22 1146    Benjiman Core, MD 11/20/22 [phone]

## 2022-11-19 NOTE — ED Triage Notes (Signed)
"  Felt like I had a crick on right side of neck yesterday, now today it is hurting in my right shoulder and in to right side of my chest. Hurts worse with movement" per pt Denies n/v, denies shortness of breath, denies known injury

## 2022-11-19 NOTE — Discharge Instructions (Addendum)
You were seen in the ER today for evaluation of your right-sided shoulder pain.  You have a palpable muscle spasms in your shoulder and back which is likely causing her pain.  I given you muscle relaxer in the ER as well as some pain medicine.  I am sending you home with some muscle laxer's to take as needed.  Please do not drive or operate heavy machinery while on this medication as it will make you sleepy.  I recommend taking an 1000 mg of Tylenol and/or 600 mg of ibuprofen every 6 hours as needed for pain.  I also recommend applying heat or ice to the area.  You can apply lidocaine afterwards as well.  As recommended gentle stretching and massages as well.  If you have any concerns, new or worsening symptoms, please return to the nearest emergency department for evaluation.  Contact a health care provider if: Your cramps or spasms get more severe or happen more often. Your cramps or spasms do not get better over time.

## 2022-11-20 ENCOUNTER — Encounter: Payer: Self-pay | Admitting: General Practice

## 2022-11-25 ENCOUNTER — Ambulatory Visit (HOSPITAL_COMMUNITY): Payer: 59

## 2022-11-25 ENCOUNTER — Ambulatory Visit
Admission: RE | Admit: 2022-11-25 | Discharge: 2022-11-25 | Disposition: A | Discharge status: Home / Self Care | Payer: 59 | Source: Ambulatory Visit | Attending: Student | Admitting: Student

## 2022-11-25 DIAGNOSIS — J984 Other disorders of lung: Secondary | ICD-10-CM

## 2022-11-25 NOTE — Progress Notes (Deleted)
Synopsis: Referred for *** by [name], NP  Subjective:   PATIENT ID: Tara [name] GENDER: female DOB: Aug 19, 1985, MRN: [id number]  No chief complaint on file.     ***  Otherwise pertinent review of systems is negative.  Past Medical History:  Diagnosis Date   Headache(784.0)    Heart murmur    as baby   History of chlamydia    History of gonorrhea    Preterm labor    Urinary tract infection      Family History  Problem Relation Age of Onset   Breast cancer Paternal Aunt        in 20's or 30's   Anesthesia problems Neg Hx    Hearing loss Neg Hx      Past Surgical History:  Procedure Laterality Date   DILATION AND CURETTAGE OF UTERUS     INDUCED ABORTION     LAPAROSCOPIC TUBAL LIGATION  01/09/2012   Procedure: LAPAROSCOPIC TUBAL LIGATION;  Surgeon: Kathreen Cosier, MD;  Location: [facility];  Service: Gynecology;  Laterality: Bilateral;   ORIF WRIST FRACTURE Left 01/15/2014   Procedure: OPEN REDUCTION INTERNAL FIXATION (ORIF) WRIST FRACTURE;  Surgeon: Sharma Covert, MD;  Location: [facility] OR;  Service: Orthopedics;  Laterality: Left;   TUBAL LIGATION      Social History   Socioeconomic History   Marital status: Significant Other    Spouse name: Not on file   Number of children: Not on file   Years of education: Not on file   Highest education level: 9th grade  Occupational History   Not on file  Tobacco Use   Smoking status: Some Days    Packs/day: .25    Types: Cigarettes   Smokeless tobacco: Never  Vaping Use   Vaping Use: Some days   Substances: Nicotine, Flavoring  Substance and Sexual Activity   Alcohol use: Not Currently   Drug use: No   Sexual activity: Yes    Birth control/protection: Surgical  Other Topics Concern   Not on file  Social History Narrative   Not on file   Social Determinants of Health   Financial Resource Strain: Low Risk  (10/28/2022)   Overall Financial Resource Strain (CARDIA)    Difficulty of Paying Living  Expenses: Not hard at all  Food Insecurity: No Food Insecurity (10/28/2022)   Hunger Vital Sign    Worried About Running Out of Food in the Last Year: Never true    Ran Out of Food in the Last Year: Never true  Transportation Needs: No Transportation Needs (10/28/2022)   PRAPARE - Administrator, Civil Service (Medical): No    Lack of Transportation (Non-Medical): No  Physical Activity: Insufficiently Active (10/28/2022)   Exercise Vital Sign    Days of Exercise per Week: 2 days    Minutes of Exercise per Session: 20 min  Stress: No Stress Concern Present (10/28/2022)   [employer] of Occupational Health - Occupational Stress Questionnaire    Feeling of Stress : Not at all  Social Connections: Moderately Isolated (10/28/2022)   Social Connection and Isolation Panel [NHANES]    Frequency of Communication with Friends and Family: More than three times a week    Frequency of Social Gatherings with Friends and Family: Once a week    Attends Religious Services: 1 to 4 times per year    Active Member of [employer] or Organizations: No    Attends Banker Meetings: Not on file  Marital Status: Separated  Intimate Partner Violence: Not At Risk (10/14/2022)   Humiliation, Afraid, Rape, and Kick questionnaire    Fear of Current or Ex-Partner: No    Emotionally Abused: No    Physically Abused: No    Sexually Abused: No     No Known Allergies   Outpatient Medications Prior to Visit  Medication Sig Dispense Refill   busPIRone (BUSPAR) 5 MG tablet Take 1 tablet (5 mg total) by mouth 2 (two) times daily. 60 tablet 1   fluconazole (DIFLUCAN) 150 MG tablet Take 1 tablet (150 mg total) by mouth daily. (Patient not taking: Reported on 11/01/2022) 1 tablet 1   ibuprofen (ADVIL) 400 MG tablet Take 1 tablet (400 mg total) by mouth every 8 (eight) hours as needed. (Patient not taking: Reported on 11/01/2022) 90 tablet 1   tiZANidine (ZANAFLEX) 4 MG tablet Take 1 tablet (4 mg total) by  mouth every 8 (eight) hours as needed for muscle spasms. 10 tablet 0   No facility-administered medications prior to visit.       Objective:   Physical Exam:  General appearance: 37 y.o., female, NAD, conversant  Eyes: anicteric sclerae; PERRL, tracking appropriately HENT: NCAT; MMM Neck: Trachea midline; no lymphadenopathy, no JVD Lungs: CTAB, no crackles, no wheeze, with normal respiratory effort CV: RRR, no murmur  Abdomen: Soft, non-tender; non-distended, BS present  Extremities: No peripheral edema, warm Skin: Normal turgor and texture; no rash Psych: Appropriate affect Neuro: Alert and oriented to person and place, no focal deficit     There were no vitals filed for this visit.   on *** LPM *** RA BMI Readings from Last 3 Encounters:  11/19/22 21.58 kg/m  11/01/22 21.80 kg/m  10/15/22 20.37 kg/m   Wt Readings from Last 3 Encounters:  11/19/22 118 lb (53.5 kg)  11/01/22 119 lb 3.2 oz (54.1 kg)  10/15/22 115 lb (52.2 kg)     CBC    Component Value Date/Time   WBC 5.1 10/14/2022 0404   RBC 3.34 (L) 10/14/2022 0404   HGB 10.5 (L) 10/14/2022 0404   HGB 10.8 (L) 04/02/2022 1513   HCT 31.6 (L) 10/14/2022 0404   HCT 32.9 (L) 04/02/2022 1513   PLT 300 10/14/2022 0404   PLT 306 04/02/2022 1513   MCV 94.6 10/14/2022 0404   MCV 95 04/02/2022 1513   MCH 31.4 10/14/2022 0404   MCHC 33.2 10/14/2022 0404   RDW 12.6 10/14/2022 0404   RDW 11.6 (L) 04/02/2022 1513   LYMPHSABS 2.5 10/13/2022 1617   LYMPHSABS 1.8 04/02/2022 1513   MONOABS 0.5 10/13/2022 1617   EOSABS 0.1 10/13/2022 1617   EOSABS 0.1 04/02/2022 1513   BASOSABS 0.0 10/13/2022 1617   BASOSABS 0.0 04/02/2022 1513    ***  Chest Imaging: ***  Pulmonary Functions Testing Results:     No data to display          FeNO: ***  Pathology: ***  Echocardiogram: ***  Heart Catheterization: ***    Assessment & Plan:    Plan:      Omar Person, MD [facility] 11/25/2022 7:39 AM

## 2022-11-27 ENCOUNTER — Institutional Professional Consult (permissible substitution): Payer: Medicaid Other | Admitting: Student

## 2022-12-12 ENCOUNTER — Encounter: Payer: Self-pay | Admitting: Student

## 2023-01-01 ENCOUNTER — Ambulatory Visit (INDEPENDENT_AMBULATORY_CARE_PROVIDER_SITE_OTHER): Payer: 59 | Admitting: Primary Care

## 2023-01-01 ENCOUNTER — Encounter (INDEPENDENT_AMBULATORY_CARE_PROVIDER_SITE_OTHER): Payer: Self-pay

## 2023-01-05 NOTE — Progress Notes (Deleted)
Cardiology Clinic Note   Patient Name: Tara [name] Date of Encounter: 01/05/2023  Primary Care Provider:  [name], NP Primary Cardiologist:  Nanetta Batty, MD  Patient Profile    Tara [name] 37 year old female presents the clinic today for follow-up evaluation of her palpitations and sinus tachycardia.  Past Medical History    Past Medical History:  Diagnosis Date   Headache(784.0)    Heart murmur    as baby   History of chlamydia    History of gonorrhea    Preterm labor    Urinary tract infection    Past Surgical History:  Procedure Laterality Date   DILATION AND CURETTAGE OF UTERUS     INDUCED ABORTION     LAPAROSCOPIC TUBAL LIGATION  01/09/2012   Procedure: LAPAROSCOPIC TUBAL LIGATION;  Surgeon: Kathreen Cosier, MD;  Location: [facility];  Service: Gynecology;  Laterality: Bilateral;   ORIF WRIST FRACTURE Left 01/15/2014   Procedure: OPEN REDUCTION INTERNAL FIXATION (ORIF) WRIST FRACTURE;  Surgeon: Sharma Covert, MD;  Location: [facility];  Service: Orthopedics;  Laterality: Left;   TUBAL LIGATION      Allergies  No Known Allergies  History of Present Illness    Tara [name] has a PMH of cavitary lesion of lung, left distal radius fracture, dyspnea, palpitations, and sinus tachycardia.  She was seen and evaluated by Dr. Allyson Sabal during hospital admission on 09/24/2022.  Cardiology consult was for palpitations.  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  She presented to the urgent care 10/13/2022 complaining of palpitations that started earlier that morning.  She reported chest pain that radiated down her left arm.  Her EKG showed atrial flutter with a heart rate of 134 bpm.  Her high-sensitivity troponins were 2 and 3.  Her TSH was within normal limits.  Her hemoglobin was normal.  Her chest x-ray showed no active disease.  Her chest CTA showed no evidence of arterial dilation, or embolus.  She was noted  to have small pericardial effusion and upper lobe paraseptal emphysematous changes with 4 cm thin wall bulla in her right upper lobe as well as 2 irregular cavitary lesions in her left upper lobe.  She was started on metoprolol 12.5 mg twice daily.  Her echocardiogram 09/24/2022 showed an LVEF of 55-60%, no regional wall motion abnormalities and normal RV systolic function.  She reported intermittent episodes of palpitations since 2010.  The palpitations would generally last around 15 minutes at a time.  She describes them as heart pounding and they are associated with shortness of breath.  Episodes are random.  She reported that her symptoms persisted for a few hours after coming to the emergency department.  When she was given metoprolol her heart rate slowed down and her symptoms resolved.  She reported that the night before she had smoked marijuana and had 2 shots of alcohol.  She wore a cardiac event monitor in her 39s due to palpitations.  The heart rate monitor did not show signs of arrhythmia.  She denied cough, fever, chills, body aches.  She did note some weight gain.  She denied nausea and vomiting.  She presents to the clinic today for follow-up evaluation and states***.  *** denies chest pain, shortness of breath, lower extremity edema, fatigue, palpitations, melena, hematuria, hemoptysis, diaphoresis, weakness, presyncope, syncope, orthopnea, and PND.  Palpitations, sinus tachycardia-denies further episodes of palpitations, irregular, or accelerated heart rate.  Palpitations noted  after drinking alcohol and smoking marijuana.  Resolved with metoprolol. Avoid triggers caffeine, chocolate, EtOH, dehydration etc. Increase physical activity as tolerated Continue to monitor.  History of cardiac murmur-diagnosed with cardiac murmur as a child.  Do not appreciate murmur on auscultation.  Echocardiogram 10/14/2022 showed normal EF, trivial mitral valve regurgitation and no evidence of mitral  stenosis.   Disposition: Follow-up with Dr.Berry or me in 9-12 months.  Home Medications    Prior to Admission medications   Medication Sig Start Date End Date Taking? Authorizing Provider  busPIRone (BUSPAR) 5 MG tablet Take 1 tablet (5 mg total) by mouth 2 (two) times daily. 11/01/22   [name], NP  fluconazole (DIFLUCAN) 150 MG tablet Take 1 tablet (150 mg total) by mouth daily. Patient not taking: Reported on 11/01/2022 10/22/22   [name], NP  ibuprofen (ADVIL) 400 MG tablet Take 1 tablet (400 mg total) by mouth every 8 (eight) hours as needed. Patient not taking: Reported on 11/01/2022 08/28/21   [name], NP  tiZANidine (ZANAFLEX) 4 MG tablet Take 1 tablet (4 mg total) by mouth every 8 (eight) hours as needed for muscle spasms. 11/19/22   Achille Rich, PA-C    Family History    Family History  Problem Relation Age of Onset   Breast cancer Paternal Aunt        in 20's or 30's   Anesthesia problems Neg Hx    Hearing loss Neg Hx    She indicated that her mother is alive. She indicated that her father is alive. She indicated that the status of her paternal aunt is unknown and reported the following: breast cancer. She indicated that the status of her neg hx is unknown.  Social History    Social History   Socioeconomic History   Marital status: Significant Other    Spouse name: Not on file   Number of children: Not on file   Years of education: Not on file   Highest education level: 9th grade  Occupational History   Not on file  Tobacco Use   Smoking status: Some Days    Packs/day: .25    Types: Cigarettes   Smokeless tobacco: Never  Vaping Use   Vaping Use: Some days   Substances: Nicotine, Flavoring  Substance and Sexual Activity   Alcohol use: Not Currently   Drug use: No   Sexual activity: Yes    Birth control/protection: Surgical  Other Topics Concern   Not on file  Social History Narrative   Not on file   Social Determinants  of Health   Financial Resource Strain: Low Risk  (10/28/2022)   Overall Financial Resource Strain (CARDIA)    Difficulty of Paying Living Expenses: Not hard at all  Food Insecurity: No Food Insecurity (10/28/2022)   Hunger Vital Sign    Worried About Running Out of Food in the Last Year: Never true    Ran Out of Food in the Last Year: Never true  Transportation Needs: No Transportation Needs (10/28/2022)   PRAPARE - Administrator, Civil Service (Medical): No    Lack of Transportation (Non-Medical): No  Physical Activity: Insufficiently Active (10/28/2022)   Exercise Vital Sign    Days of Exercise per Week: 2 days    Minutes of Exercise per Session: 20 min  Stress: No Stress Concern Present (10/28/2022)   [employer] of Occupational Health - Occupational Stress Questionnaire    Feeling of Stress : Not at all  Social Connections: Moderately Isolated (10/28/2022)   Social Connection and Isolation Panel [NHANES]    Frequency of Communication with Friends and Family: More than three times a week    Frequency of Social Gatherings with Friends and Family: Once a week    Attends Religious Services: 1 to 4 times per year    Active Member of [employer] or Organizations: No    Attends Engineer, structural: Not on file    Marital Status: Separated  Intimate Partner Violence: Not At Risk (10/14/2022)   Humiliation, Afraid, Rape, and Kick questionnaire    Fear of Current or Ex-Partner: No    Emotionally Abused: No    Physically Abused: No    Sexually Abused: No     Review of Systems    General:  No chills, fever, night sweats or weight changes.  Cardiovascular:  No chest pain, dyspnea on exertion, edema, orthopnea, palpitations, paroxysmal nocturnal dyspnea. Dermatological: No rash, lesions/masses Respiratory: No cough, dyspnea Urologic: No hematuria, dysuria Abdominal:   No nausea, vomiting, diarrhea, bright red blood per rectum, melena, or hematemesis Neurologic:  No visual  changes, wkns, changes in mental status. All other systems reviewed and are otherwise negative except as noted above.  Physical Exam    VS:  There were no vitals taken for this visit. , BMI There is no height or weight on file to calculate BMI. GEN: Well nourished, well developed, in no acute distress. HEENT: normal. Neck: Supple, no JVD, carotid bruits, or masses. Cardiac: RRR, no murmurs, rubs, or gallops. No clubbing, cyanosis, edema.  Radials/DP/PT 2+ and equal bilaterally.  Respiratory:  Respirations regular and unlabored, clear to auscultation bilaterally. GI: Soft, nontender, nondistended, BS + x 4. MS: no deformity or atrophy. Skin: warm and dry, no rash. Neuro:  Strength and sensation are intact. Psych: Normal affect.  Accessory Clinical Findings    Recent Labs: 10/13/2022: ALT 11; TSH 4.392 10/14/2022: BUN 8; Creatinine, Ser 0.58; Hemoglobin 10.5; Magnesium 1.8; Platelets 300; Potassium 3.9; Sodium 135   Recent Lipid Panel No results found for: "CHOL", "TRIG", "HDL", "CHOLHDL", "VLDL", "LDLCALC", "LDLDIRECT"  No BP recorded.  {Refresh Note OR Click here to enter BP  :1}***    ECG personally reviewed by me today- *** - No acute changes  Echocardiogram 10/14/2022  IMPRESSIONS     1. Left ventricular ejection fraction, by estimation, is 55 to 60%. The  left ventricle has normal function. The left ventricle has no regional  wall motion abnormalities. Left ventricular diastolic parameters were  normal.   2. Right ventricular systolic function is normal. The right ventricular  size is normal.   3. The mitral valve is normal in structure. Trivial mitral valve  regurgitation. No evidence of mitral stenosis.   4. The aortic valve is normal in structure. Aortic valve regurgitation is  not visualized. No aortic stenosis is present.   5. The inferior vena cava is dilated in size with >50% respiratory  variability, suggesting right atrial pressure of 8 mmHg.   FINDINGS    Left Ventricle: Left ventricular ejection fraction, by estimation, is 55  to 60%. The left ventricle has normal function. The left ventricle has no  regional wall motion abnormalities. The left ventricular internal cavity  size was normal in size. There is   no left ventricular hypertrophy. Left ventricular diastolic parameters  were normal.   Right Ventricle: The right ventricular size is normal. No increase in  right ventricular wall thickness. Right ventricular systolic function is  normal.   Left Atrium: Left atrial size was normal in size.   Right Atrium: Right atrial size was normal in size.   Pericardium: There is no evidence of pericardial effusion.   Mitral Valve: The mitral valve is normal in structure. Trivial mitral  valve regurgitation. No evidence of mitral valve stenosis.   Tricuspid Valve: The tricuspid valve is normal in structure. Tricuspid  valve regurgitation is not demonstrated. No evidence of tricuspid  stenosis.   Aortic Valve: The aortic valve is normal in structure. Aortic valve  regurgitation is not visualized. No aortic stenosis is present. Aortic  valve mean gradient measures 2.0 mmHg. Aortic valve peak gradient measures  4.6 mmHg. Aortic valve area, by VTI  measures 1.80 cm.   Pulmonic Valve: The pulmonic valve was normal in structure. Pulmonic valve  regurgitation is not visualized. No evidence of pulmonic stenosis.   Aorta: The aortic root is normal in size and structure.   Venous: The inferior vena cava is dilated in size with greater than 50%  respiratory variability, suggesting right atrial pressure of 8 mmHg.   IAS/Shunts: No atrial level shunt detected by color flow Doppler.       Assessment & Plan   1.  ***   Thomasene Ripple. Trelyn Vanderlinde NP-C     01/05/2023, 2:40 PM [facility] [address] Office [phone] Fax [phone]    I spent***minutes examining this patient, reviewing medications,  and using patient centered shared decision making involving her cardiac care.  Prior to her visit I spent greater than 20 minutes reviewing her past medical history,  medications, and prior cardiac tests.

## 2023-01-07 ENCOUNTER — Ambulatory Visit: Payer: 59 | Attending: General Practice | Admitting: General Practice

## 2023-01-13 ENCOUNTER — Telehealth (INDEPENDENT_AMBULATORY_CARE_PROVIDER_SITE_OTHER): Payer: Self-pay | Admitting: Primary Care

## 2023-01-13 NOTE — Telephone Encounter (Signed)
Left VM with Pt 

## 2023-02-18 NOTE — Progress Notes (Deleted)
Cardiology Clinic Note   Patient Name: Tara [name] Date of Encounter: 02/18/2023  Primary Care Provider:  [name], NP Primary Cardiologist:  Nanetta Batty, MD  Patient Profile    37 year old female with history of intermittent palpitations, tobacco use with marijuana, and EtOH use, and tachycardia.  Per Dr. Hazle Coca note, the patient reported a history of congenital heart disease, but review of past cardiac history only identified heart murmur as a baby.  She was seen on consultation by Dr. Allyson Sabal during hospitalization in March 2024 for rapid heart rhythm.  Echocardiogram revealed normal LV systolic function 55 to 60% no wall motion abnormalities or valvular abnormalities.  TSH was within normal limits.  She was started on metoprolol 12.5 mg twice daily and heart rate was well-controlled on follow-up a 2-week ZIO monitor was ordered as well as carotid Doppler ultrasound.  ZIO monitor dated 11/07/2022 revealed that the patient had predominantly sinus rhythm with rare PACs and PVCs which was a normal finding.  She was to continue her current medication regimen.  Carotid Doppler studies were not completed.  Past Medical History    Past Medical History:  Diagnosis Date   Headache(784.0)    Heart murmur    as baby   History of chlamydia    History of gonorrhea    Preterm labor    Urinary tract infection    Past Surgical History:  Procedure Laterality Date   DILATION AND CURETTAGE OF UTERUS     INDUCED ABORTION     LAPAROSCOPIC TUBAL LIGATION  01/09/2012   Procedure: LAPAROSCOPIC TUBAL LIGATION;  Surgeon: Kathreen Cosier, MD;  Location: [facility];  Service: Gynecology;  Laterality: Bilateral;   ORIF WRIST FRACTURE Left 01/15/2014   Procedure: OPEN REDUCTION INTERNAL FIXATION (ORIF) WRIST FRACTURE;  Surgeon: Sharma Covert, MD;  Location: [facility];  Service: Orthopedics;  Laterality: Left;   TUBAL LIGATION      Allergies  No Known Allergies  History of Present Illness     ***  Home Medications    Current Outpatient Medications  Medication Sig Dispense Refill   busPIRone (BUSPAR) 5 MG tablet Take 1 tablet (5 mg total) by mouth 2 (two) times daily. 60 tablet 1   fluconazole (DIFLUCAN) 150 MG tablet Take 1 tablet (150 mg total) by mouth daily. (Patient not taking: Reported on 11/01/2022) 1 tablet 1   ibuprofen (ADVIL) 400 MG tablet Take 1 tablet (400 mg total) by mouth every 8 (eight) hours as needed. (Patient not taking: Reported on 11/01/2022) 90 tablet 1   tiZANidine (ZANAFLEX) 4 MG tablet Take 1 tablet (4 mg total) by mouth every 8 (eight) hours as needed for muscle spasms. 10 tablet 0   No current facility-administered medications for this visit.     Family History    Family History  Problem Relation Age of Onset   Breast cancer Paternal Aunt        in 20's or 30's   Anesthesia problems Neg Hx    Hearing loss Neg Hx    She indicated that her mother is alive. She indicated that her father is alive. She indicated that the status of her paternal aunt is unknown and reported the following: breast cancer. She indicated that the status of her neg hx is unknown.  Social History    Social History   Socioeconomic History   Marital status: Significant Other    Spouse name: Not on file   Number of children: Not on file  Years of education: Not on file   Highest education level: 9th grade  Occupational History   Not on file  Tobacco Use   Smoking status: Some Days    Current packs/day: 0.25    Types: Cigarettes   Smokeless tobacco: Never  Vaping Use   Vaping status: Some Days   Substances: Nicotine, Flavoring  Substance and Sexual Activity   Alcohol use: Not Currently   Drug use: No   Sexual activity: Yes    Birth control/protection: Surgical  Other Topics Concern   Not on file  Social History Narrative   Not on file   Social Determinants of Health   Financial Resource Strain: Low Risk  (10/28/2022)   Overall Financial Resource Strain  (CARDIA)    Difficulty of Paying Living Expenses: Not hard at all  Food Insecurity: No Food Insecurity (10/28/2022)   Hunger Vital Sign    Worried About Running Out of Food in the Last Year: Never true    Ran Out of Food in the Last Year: Never true  Transportation Needs: No Transportation Needs (10/28/2022)   PRAPARE - Administrator, Civil Service (Medical): No    Lack of Transportation (Non-Medical): No  Physical Activity: Insufficiently Active (10/28/2022)   Exercise Vital Sign    Days of Exercise per Week: 2 days    Minutes of Exercise per Session: 20 min  Stress: No Stress Concern Present (10/28/2022)   [employer] of Occupational Health - Occupational Stress Questionnaire    Feeling of Stress : Not at all  Social Connections: Moderately Isolated (10/28/2022)   Social Connection and Isolation Panel [NHANES]    Frequency of Communication with Friends and Family: More than three times a week    Frequency of Social Gatherings with Friends and Family: Once a week    Attends Religious Services: 1 to 4 times per year    Active Member of [employer] or Organizations: No    Attends Engineer, structural: Not on file    Marital Status: Separated  Intimate Partner Violence: Not At Risk (10/14/2022)   Humiliation, Afraid, Rape, and Kick questionnaire    Fear of Current or Ex-Partner: No    Emotionally Abused: No    Physically Abused: No    Sexually Abused: No     Review of Systems    General:  No chills, fever, night sweats or weight changes.  Cardiovascular:  No chest pain, dyspnea on exertion, edema, orthopnea, palpitations, paroxysmal nocturnal dyspnea. Dermatological: No rash, lesions/masses Respiratory: No cough, dyspnea Urologic: No hematuria, dysuria Abdominal:   No nausea, vomiting, diarrhea, bright red blood per rectum, melena, or hematemesis Neurologic:  No visual changes, wkns, changes in mental status. All other systems reviewed and are otherwise negative  except as noted above.       Physical Exam    VS:  There were no vitals taken for this visit. , BMI There is no height or weight on file to calculate BMI.     GEN: Well nourished, well developed, in no acute distress. HEENT: normal. Neck: Supple, no JVD, carotid bruits, or masses. Cardiac: RRR, no murmurs, rubs, or gallops. No clubbing, cyanosis, edema.  Radials/DP/PT 2+ and equal bilaterally.  Respiratory:  Respirations regular and unlabored, clear to auscultation bilaterally. GI: Soft, nontender, nondistended, BS + x 4. MS: no deformity or atrophy. Skin: warm and dry, no rash. Neuro:  Strength and sensation are intact. Psych: Normal affect.      Lab Results  Component Value Date   WBC 5.1 10/14/2022   HGB 10.5 (L) 10/14/2022   HCT 31.6 (L) 10/14/2022   MCV 94.6 10/14/2022   PLT 300 10/14/2022   Lab Results  Component Value Date   CREATININE 0.58 10/14/2022   BUN 8 10/14/2022   NA 135 10/14/2022   K 3.9 10/14/2022   CL 107 10/14/2022   CO2 23 10/14/2022   Lab Results  Component Value Date   ALT 11 10/13/2022   AST 21 10/13/2022   ALKPHOS 50 10/13/2022   BILITOT 0.8 10/13/2022   No results found for: "CHOL", "HDL", "LDLCALC", "LDLDIRECT", "TRIG", "CHOLHDL"  Lab Results  Component Value Date   HGBA1C 4.8 08/28/2021     Review of Prior Studies    Cardiac ZIO monitor 11/07/2022 Patient had a min HR of 55 bpm, max HR of 154 bpm, and avg HR of 87 bpm. Predominant underlying rhythm was Sinus Rhythm. Isolated SVEs were rare (<1.0%), and no SVE Couplets or SVE Triplets were present. Isolated VEs were rare (<1.0%), and no VE Couplets  or VE Triplets were present.    SR/SB/ST Occasional PAC's/PVC's No arrhthymias found  Echocardiogram 10/14/2022 1. Left ventricular ejection fraction, by estimation, is 55 to 60%. The  left ventricle has normal function. The left ventricle has no regional  wall motion abnormalities. Left ventricular diastolic parameters were   normal.   2. Right ventricular systolic function is normal. The right ventricular  size is normal.   3. The mitral valve is normal in structure. Trivial mitral valve  regurgitation. No evidence of mitral stenosis.   4. The aortic valve is normal in structure. Aortic valve regurgitation is  not visualized. No aortic stenosis is present.   5. The inferior vena cava is dilated in size with >50% respiratory  variability, suggesting right atrial pressure of 8 mmHg.    Assessment & Plan   1.  ***     {Are you ordering a CV Procedure (e.g. stress test, cath, DCCV, TEE, etc)?   Press F2        :[id number]}   Signed, Bettey Mare. Liborio Nixon, ANP, AACC   02/18/2023 8:40 AM      Office [phone] Fax [phone]  Notice: This dictation was prepared with Dragon dictation along with smaller phrase technology. Any transcriptional errors that result from this process are unintentional and may not be corrected upon review.

## 2023-02-20 ENCOUNTER — Ambulatory Visit: Payer: 59 | Attending: General Practice | Admitting: Adult Health

## 2023-04-17 ENCOUNTER — Other Ambulatory Visit (HOSPITAL_COMMUNITY)
Admission: RE | Admit: 2023-04-17 | Discharge: 2023-04-17 | Disposition: A | Discharge status: Home / Self Care | Payer: 59 | Source: Ambulatory Visit | Attending: Primary Care | Admitting: Primary Care

## 2023-04-17 ENCOUNTER — Encounter (INDEPENDENT_AMBULATORY_CARE_PROVIDER_SITE_OTHER): Payer: Self-pay | Admitting: Primary Care

## 2023-04-17 ENCOUNTER — Ambulatory Visit (INDEPENDENT_AMBULATORY_CARE_PROVIDER_SITE_OTHER): Payer: 59 | Admitting: Primary Care

## 2023-04-17 VITALS — BP 111/72 | HR 69 | Resp 16 | Wt 119.0 lb

## 2023-04-17 DIAGNOSIS — Z113 Encounter for screening for infections with a predominantly sexual mode of transmission: Secondary | ICD-10-CM

## 2023-04-17 DIAGNOSIS — Z1159 Encounter for screening for other viral diseases: Secondary | ICD-10-CM | POA: Diagnosis not present

## 2023-04-17 DIAGNOSIS — R300 Dysuria: Secondary | ICD-10-CM | POA: Diagnosis not present

## 2023-04-17 DIAGNOSIS — N390 Urinary tract infection, site not specified: Secondary | ICD-10-CM

## 2023-04-17 DIAGNOSIS — F411 Generalized anxiety disorder: Secondary | ICD-10-CM

## 2023-04-17 DIAGNOSIS — B3731 Acute candidiasis of vulva and vagina: Secondary | ICD-10-CM | POA: Insufficient documentation

## 2023-04-17 LAB — CERVICOVAGINAL ANCILLARY ONLY
Bacterial Vaginitis (gardnerella): NEGATIVE
Candida Glabrata: NEGATIVE
Candida Vaginitis: POSITIVE — AB
Chlamydia: NEGATIVE
Comment: NEGATIVE
Comment: NEGATIVE
Comment: NEGATIVE
Comment: NEGATIVE
Comment: NEGATIVE
Comment: NORMAL
Neisseria Gonorrhea: NEGATIVE
Trichomonas: NEGATIVE

## 2023-04-17 LAB — POCT URINALYSIS DIP (CLINITEK)
Bilirubin, UA: NEGATIVE
Glucose, UA: NEGATIVE mg/dL
Ketones, POC UA: NEGATIVE mg/dL
Nitrite, UA: NEGATIVE
POC PROTEIN,UA: 100 — AB
Spec Grav, UA: 1.02 (ref 1.010–1.025)
Urobilinogen, UA: 2 E.U./dL — AB
pH, UA: 8.5 — AB (ref 5.0–8.0)

## 2023-04-17 MED ORDER — PHENAZOPYRIDINE HCL 100 MG PO TABS: 100.0000 mg | ORAL_TABLET | Freq: Three times a day (TID) | ORAL | 0 refills | Status: DC | PRN

## 2023-04-17 MED ORDER — BUSPIRONE HCL 7.5 MG PO TABS: 7.5000 mg | ORAL_TABLET | Freq: Two times a day (BID) | ORAL | 0 refills | Status: DC

## 2023-04-17 NOTE — Addendum Note (Signed)
Addended by: Herbert Deaner on: 04/17/2023 10:26 AM   Modules accepted: Orders

## 2023-04-17 NOTE — Patient Instructions (Signed)

## 2023-04-17 NOTE — Progress Notes (Signed)
[facility]  Tara [name], is a 37 y.o. female  [id number]  [id number]  DOB - Aug 23, 1985  Chief Complaint  Patient presents with   Labs Only    Pt is requesting to have hep c screening    std screening    Pt is requesting to be checked for yeast     Dysuria    Wants to check for bladder infection        Subjective:   Tara [name] is a 37 y.o. female here today she is accompanied by Darius which she has given permission to be at her appointment for a follow up visit for medication effectiveness . Previously prescibed Buspar and she does admit it has help feeling over whelm. Question if this dose fills effective?  No would like to increase.  Patient has No headache, No chest pain, No abdominal pain - No Nausea, No new weakness tingling or numbness, No Cough - shortness of breath. She c/o dysuria and vaginal discharge.  No problems updated.  No Known Allergies  Past Medical History:  Diagnosis Date   Headache(784.0)    Heart murmur    as baby   History of chlamydia    History of gonorrhea    Preterm labor    Urinary tract infection     Current Outpatient Medications on File Prior to Visit  Medication Sig Dispense Refill   tiZANidine (ZANAFLEX) 4 MG tablet Take 1 tablet (4 mg total) by mouth every 8 (eight) hours as needed for muscle spasms. 10 tablet 0   No current facility-administered medications on file prior to visit.    Objective:   Vitals:   04/17/23 0929  BP: 111/72  Pulse: 69  Resp: 16  SpO2: 100%  Weight: 119 lb (54 kg)    Comprehensive ROS Pertinent positive and negative noted in HPI   Exam General appearance : Awake, alert, not in any distress. Speech Clear. Not toxic looking HEENT: Atraumatic and Normocephalic, pupils equally reactive to light and accomodation Neck: Supple, no JVD. No cervical lymphadenopathy.  Chest: Good air entry bilaterally, no added sounds  CVS: S1 S2 regular, no murmurs.  Abdomen: Bowel  sounds present, Non tender and not distended with no gaurding, rigidity or rebound. Extremities: B/L Lower Ext shows no edema, both legs are warm to touch Neurology: Awake alert, and oriented X 3, CN II-XII intact, Non focal Skin: No Rash  Data Review Lab Results  Component Value Date   HGBA1C 4.8 08/28/2021    Assessment & Plan  Kalyn was seen today for labs only, std screening and dysuria.  Diagnoses and all orders for this visit:  Generalized anxiety disorder Increase Buspar 7.5mg  BID  Encounter for HCV screening test for low risk patient -     HCV Ab w Reflex to Quant PCR  Dysuria -     POCT URINALYSIS DIP (CLINITEK)  Screening examination for STD (sexually transmitted disease) -     Cervicovaginal ancillary only  Other orders -     busPIRone (BUSPAR) 7.5 MG tablet; Take 1 tablet (7.5 mg total) by mouth 2 (two) times daily.    Patient have been counseled extensively about nutrition and exercise. Other issues discussed during this visit include: low cholesterol diet, weight control and daily exercise, foot care, annual eye examinations at Ophthalmology, importance of adherence with medications and regular follow-up. We also discussed long term complications of uncontrolled diabetes and hypertension.   Return in about 2 months (around 06/17/2023) for medication management.  The patient was given clear instructions to go to ER or return to medical center if symptoms don't improve, worsen or new problems develop. The patient verbalized understanding. The patient was told to call to get lab results if they haven't heard anything in the next week.   This note has been created with Education officer, environmental. Any transcriptional errors are unintentional.   [name], NP 04/17/2023, 10:10 AM

## 2023-04-18 LAB — HCV INTERPRETATION

## 2023-04-18 LAB — HCV AB W REFLEX TO QUANT PCR: HCV Ab: NONREACTIVE

## 2023-04-21 ENCOUNTER — Other Ambulatory Visit (INDEPENDENT_AMBULATORY_CARE_PROVIDER_SITE_OTHER): Payer: Self-pay | Admitting: Primary Care

## 2023-04-21 DIAGNOSIS — N390 Urinary tract infection, site not specified: Secondary | ICD-10-CM

## 2023-04-21 LAB — URINE CULTURE

## 2023-04-21 MED ORDER — FLUCONAZOLE 150 MG PO TABS: 150.0000 mg | ORAL_TABLET | Freq: Every day | ORAL | 1 refills | Status: DC

## 2023-04-21 MED ORDER — NITROFURANTOIN MONOHYD MACRO 100 MG PO CAPS: 100.0000 mg | ORAL_CAPSULE | Freq: Two times a day (BID) | ORAL | 0 refills | Status: DC

## 2023-04-22 ENCOUNTER — Telehealth (INDEPENDENT_AMBULATORY_CARE_PROVIDER_SITE_OTHER): Payer: Self-pay | Admitting: Primary Care

## 2023-04-22 ENCOUNTER — Encounter (INDEPENDENT_AMBULATORY_CARE_PROVIDER_SITE_OTHER): Payer: Self-pay | Admitting: Primary Care

## 2023-04-22 NOTE — Telephone Encounter (Signed)
Will forward to provider  

## 2023-04-22 NOTE — Telephone Encounter (Signed)
Pt called in states, meds prescibed, nitrofurantoin, macrocrystal-monohydrate, (MACROBID) 100 MG capsule  and Diflucan 150mg ,isn't covered under her insurance. She is looking for alternatives.

## 2023-04-24 ENCOUNTER — Telehealth (INDEPENDENT_AMBULATORY_CARE_PROVIDER_SITE_OTHER): Payer: Self-pay | Admitting: Primary Care

## 2023-04-24 ENCOUNTER — Other Ambulatory Visit (INDEPENDENT_AMBULATORY_CARE_PROVIDER_SITE_OTHER): Payer: Self-pay

## 2023-04-24 DIAGNOSIS — N390 Urinary tract infection, site not specified: Secondary | ICD-10-CM

## 2023-04-24 NOTE — Telephone Encounter (Signed)
Contacted pt and made aware that rxs were sent on 04/21/23 to that preferred pharmacy. Pt states she understands and doesn't have any questions or concerns

## 2023-04-24 NOTE — Telephone Encounter (Signed)
[facility] [phone] - [place], [place] - [address] AT [facility] [address]  [address] [place] [address]  Phone: [phone] Fax: [phone]    Pt called requesting for her two recent prescriptions to be sent to the pharmacy above instead of [facility].  fluconazole (DIFLUCAN) 150 MG tablet  nitrofurantoin, macrocrystal-monohydrate, (MACROBID) 100 MG capsule

## 2023-05-20 ENCOUNTER — Telehealth (INDEPENDENT_AMBULATORY_CARE_PROVIDER_SITE_OTHER): Payer: Self-pay | Admitting: Primary Care

## 2023-05-20 NOTE — Telephone Encounter (Signed)
Called to inform pt that we moved their apt to virtual.

## 2023-05-21 ENCOUNTER — Telehealth (INDEPENDENT_AMBULATORY_CARE_PROVIDER_SITE_OTHER): Payer: 59 | Admitting: Primary Care

## 2023-05-21 DIAGNOSIS — M25512 Pain in left shoulder: Secondary | ICD-10-CM

## 2023-05-21 MED ORDER — TIZANIDINE HCL 4 MG PO TABS: 4.0000 mg | ORAL_TABLET | Freq: Three times a day (TID) | ORAL | 1 refills | Status: DC | PRN
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
## 2023-05-27 ENCOUNTER — Other Ambulatory Visit: Payer: Self-pay | Admitting: Primary Care

## 2023-05-27 DIAGNOSIS — Z1231 Encounter for screening mammogram for malignant neoplasm of breast: Secondary | ICD-10-CM

## 2023-05-28 DIAGNOSIS — Z1231 Encounter for screening mammogram for malignant neoplasm of breast: Secondary | ICD-10-CM

## 2023-06-23 ENCOUNTER — Ambulatory Visit (INDEPENDENT_AMBULATORY_CARE_PROVIDER_SITE_OTHER): Payer: 59 | Admitting: Primary Care

## 2023-06-23 ENCOUNTER — Encounter (INDEPENDENT_AMBULATORY_CARE_PROVIDER_SITE_OTHER): Payer: Self-pay | Admitting: Primary Care

## 2023-06-23 VITALS — BP 100/66 | HR 79 | Resp 16 | Wt 117.2 lb

## 2023-06-23 DIAGNOSIS — M25512 Pain in left shoulder: Secondary | ICD-10-CM | POA: Diagnosis not present

## 2023-06-23 MED ORDER — DICLOFENAC SODIUM 1 % EX GEL: 2.0000 g | Freq: Four times a day (QID) | CUTANEOUS | 1 refills | Status: DC

## 2023-06-23 NOTE — Progress Notes (Signed)
[facility]  Tara [name], is a 37 y.o. female  [id number]  [id number]  DOB - [birth year]  Chief Complaint  Patient presents with   Shoulder Pain    Left shoulder        Subjective:   Tara [name] is a 37 y.o. female here today for a follow up visit. Patient has No headache, No chest pain, No abdominal pain - No Nausea, No new weakness tingling or numbness, No Cough - shortness of breath Shoulder Pain  The pain is present in the right shoulder. This is a chronic problem. The current episode started more than 1 month ago. There has been no history of extremity trauma. The problem occurs daily. The problem has been gradually worsening. The quality of the pain is described as aching, burning, pounding and sharp. The pain is at a severity of 6/10. The pain is moderate. Associated symptoms include a limited range of motion and stiffness. Associated symptoms comments: Popping/crepitus. The symptoms are aggravated by activity. She has tried OTC pain meds for the symptoms. The treatment provided no relief.    No problems updated.  No Known Allergies  Past Medical History:  Diagnosis Date   Headache(784.0)    Heart murmur    as baby   History of chlamydia    History of gonorrhea    Preterm labor    Urinary tract infection     Current Outpatient Medications on File Prior to Visit  Medication Sig Dispense Refill   busPIRone (BUSPAR) 7.5 MG tablet Take 1 tablet (7.5 mg total) by mouth 2 (two) times daily. 180 tablet 0   fluconazole (DIFLUCAN) 150 MG tablet Take 1 tablet (150 mg total) by mouth daily. (Patient not taking: Reported on 06/23/2023) 1 tablet 1   nitrofurantoin, macrocrystal-monohydrate, (MACROBID) 100 MG capsule Take 1 capsule (100 mg total) by mouth 2 (two) times daily. (Patient not taking: Reported on 06/23/2023) 14 capsule 0   phenazopyridine (PYRIDIUM) 100 MG tablet Take 1 tablet (100 mg total) by mouth 3 (three) times daily as needed for pain.  (Patient not taking: Reported on 06/23/2023) 10 tablet 0   tiZANidine (ZANAFLEX) 4 MG tablet Take 1 tablet (4 mg total) by mouth every 8 (eight) hours as needed for muscle spasms. 30 tablet 1   No current facility-administered medications on file prior to visit.    Objective:   Vitals:   06/23/23 1016  BP: 100/66  Pulse: 79  Resp: 16  SpO2: 98%  Weight: 117 lb 3.2 oz (53.2 kg)    Comprehensive ROS Pertinent positive and negative noted in HPI   Exam General appearance : Awake, alert, not in any distress. Speech Clear. Not toxic looking HEENT: Atraumatic and Normocephalic, pupils equally reactive to light and accomodation Neck: Supple, no JVD. No cervical lymphadenopathy.  Chest: Good air entry bilaterally, no added sounds  CVS: S1 S2 regular, no murmurs.  Abdomen: Bowel sounds present, Non tender and not distended with no gaurding, rigidity or rebound. Extremities: B/L Lower Ext shows no edema, both legs are warm to touch Neurology: Awake alert, and oriented X 3, CN II-XII intact, Non focal Skin: No Rash  Data Review Lab Results  Component Value Date   HGBA1C 4.8 08/28/2021    Assessment & Plan  Kalayla was seen today for shoulder pain.  Diagnoses and all orders for this visit:  Pain in joint of left shoulder See HPI Other orders -     diclofenac Sodium (VOLTAREN) 1 % GEL; Apply 2  g topically 4 (four) times daily.     Patient have been counseled extensively about nutrition and exercise. Other issues discussed during this visit include: low cholesterol diet, weight control and daily exercise, foot care, annual eye examinations at Ophthalmology, importance of adherence with medications and regular follow-up. We also discussed long term complications of uncontrolled diabetes and hypertension.   Return for annual physical.  The patient was given clear instructions to go to ER or return to medical center if symptoms don't improve, worsen or new problems develop. The  patient verbalized understanding. The patient was told to call to get lab results if they haven't heard anything in the next week.   This note has been created with Education officer, environmental. Any transcriptional errors are unintentional.   [name], NP 06/23/2023, 11:02 AM

## 2023-07-10 ENCOUNTER — Ambulatory Visit (INDEPENDENT_AMBULATORY_CARE_PROVIDER_SITE_OTHER): Payer: 59 | Admitting: Primary Care

## 2023-07-17 ENCOUNTER — Other Ambulatory Visit (HOSPITAL_COMMUNITY)
Admission: RE | Admit: 2023-07-17 | Discharge: 2023-07-17 | Disposition: A | Discharge status: Home / Self Care | Payer: 59 | Source: Ambulatory Visit | Attending: Primary Care | Admitting: Primary Care

## 2023-07-17 ENCOUNTER — Ambulatory Visit (INDEPENDENT_AMBULATORY_CARE_PROVIDER_SITE_OTHER): Payer: 59 | Admitting: Primary Care

## 2023-07-17 DIAGNOSIS — N898 Other specified noninflammatory disorders of vagina: Secondary | ICD-10-CM | POA: Diagnosis not present

## 2023-07-17 NOTE — Progress Notes (Signed)
Pt came into the office for a self swab

## 2023-07-21 DIAGNOSIS — F411 Generalized anxiety disorder: Secondary | ICD-10-CM | POA: Diagnosis not present

## 2023-07-21 DIAGNOSIS — Z809 Family history of malignant neoplasm, unspecified: Secondary | ICD-10-CM | POA: Diagnosis not present

## 2023-07-21 DIAGNOSIS — I4892 Unspecified atrial flutter: Secondary | ICD-10-CM | POA: Diagnosis not present

## 2023-07-21 DIAGNOSIS — Z87891 Personal history of nicotine dependence: Secondary | ICD-10-CM | POA: Diagnosis not present

## 2023-07-21 LAB — CERVICOVAGINAL ANCILLARY ONLY
Bacterial Vaginitis (gardnerella): POSITIVE — AB
Candida Glabrata: NEGATIVE
Candida Vaginitis: NEGATIVE
Chlamydia: NEGATIVE
Comment: NEGATIVE
Comment: NEGATIVE
Comment: NEGATIVE
Comment: NEGATIVE
Comment: NEGATIVE
Comment: NORMAL
Neisseria Gonorrhea: NEGATIVE
Trichomonas: NEGATIVE

## 2023-07-22 ENCOUNTER — Encounter (INDEPENDENT_AMBULATORY_CARE_PROVIDER_SITE_OTHER): Payer: Self-pay | Admitting: Primary Care

## 2023-07-25 MED ORDER — METRONIDAZOLE 500 MG PO TABS: 500.0000 mg | ORAL_TABLET | Freq: Two times a day (BID) | ORAL | 0 refills | Status: DC

## 2023-08-12 ENCOUNTER — Telehealth (INDEPENDENT_AMBULATORY_CARE_PROVIDER_SITE_OTHER): Payer: Self-pay | Admitting: Primary Care

## 2023-08-12 ENCOUNTER — Telehealth (INDEPENDENT_AMBULATORY_CARE_PROVIDER_SITE_OTHER): Payer: Self-pay

## 2023-08-12 ENCOUNTER — Encounter (INDEPENDENT_AMBULATORY_CARE_PROVIDER_SITE_OTHER): Payer: Self-pay | Admitting: Primary Care

## 2023-08-12 DIAGNOSIS — M25512 Pain in left shoulder: Secondary | ICD-10-CM

## 2023-08-12 NOTE — Telephone Encounter (Signed)
Copied from CRM [id number]. Topic: Referral - Request for Referral >> Aug 12, 2023  1:29 PM Yolanda T wrote: Did the patient discuss referral with their provider in the last year? Yes  Appointment offered? No  Type of order/referral and detailed reason for visit: Orthopedic, left shoulder pain  Preference of office, provider, location: unknown  If referral order, have you been seen by this specialty before? No (If Yes, this issue or another issue? When? Where?  Can we respond through MyChart? Yes

## 2023-08-12 NOTE — Telephone Encounter (Signed)
Will forward to provider  

## 2023-08-12 NOTE — Telephone Encounter (Signed)
Pt sent an My Chart message in regards to a referral being sent. Pt stated that per provider " if pt did not hear anything back by January" to give her a call. Pt was reaching out to inform provider that she was still waiting to hear something about referral. Please advise

## 2023-08-15 ENCOUNTER — Encounter: Payer: Self-pay | Admitting: Physician Assistant

## 2023-08-15 ENCOUNTER — Other Ambulatory Visit (INDEPENDENT_AMBULATORY_CARE_PROVIDER_SITE_OTHER): Payer: Self-pay

## 2023-08-15 ENCOUNTER — Ambulatory Visit (INDEPENDENT_AMBULATORY_CARE_PROVIDER_SITE_OTHER): Payer: 59 | Admitting: Physician Assistant

## 2023-08-15 DIAGNOSIS — M25512 Pain in left shoulder: Secondary | ICD-10-CM | POA: Diagnosis not present

## 2023-08-15 NOTE — Progress Notes (Signed)
Office Visit Note   Patient: Tara [name]           Date of Birth: 1985-08-17           MRN: [id number] Visit Date: 08/15/2023              Requested by: Tara Sessions, NP [address] [place],  [place] [address] PCP: Tara Sessions, NP   Assessment & Plan: Visit Diagnoses:  1. Left shoulder pain, unspecified chronicity     Plan: Patient is a pleasant 38 year old woman with a 76-month history of left shoulder pain particularly with resisted abduction and raising her arm overhead.  Denies any history of dislocation any history of previous injury any repetitive injuries.  Exam today she has a slightly positive empty can test more difficulty with speeds testing pain is in the posterior shoulder.  Neurovascularly she is intact she has no paresthesias nothing in her neck.  She does have slight asymmetry with wall pushoff in her scapula but this is very mild.  It has been going on for 2 months I think to try a course of physical therapy may be helpful.  If she does not get better could consider either an MRI or nerve testing.  She will contact me if the physical therapy does not help  Follow-Up Instructions: Return if symptoms worsen or fail to improve.   Orders:  Orders Placed This Encounter  Procedures   XR Shoulder Left   Arthrogram   MR Shoulder Left w/ contrast   No orders of the defined types were placed in this encounter.     Procedures: No procedures performed   Clinical Data: No additional findings.   Subjective: No chief complaint on file.   HPI Tara [name] is a pleasant 38 year old woman with a 83-month history of left shoulder pain.  She denies any injury.  She has tried some topical medication has not had any improvement.  No history in the past of dislocation or injuries.  Review of Systems  All other systems reviewed and are negative.    Objective: Vital Signs: There were no vitals taken for this visit.  Physical  Exam Constitutional:      Appearance: Normal appearance.  Pulmonary:     Effort: Pulmonary effort is normal.  Skin:    General: Skin is warm and dry.  Neurological:     General: No focal deficit present.     Mental Status: She is alert and oriented to person, place, and time.  Psychiatric:        Mood and Affect: Mood normal.        Behavior: Behavior normal.     Ortho Exam Examination of her left shoulder she has forward elevation almost equivalent to the other side but is somewhat painful she has fair internal rotation behind her back.  She has good biceps and triceps strength good grip strength no rapid Titian of symptoms with range of motion of her neck.  No paresthesias.  She has a negative apprehension test.  She does have slight more prominence on the left than the right with wall push-up.  Also pain is reproduced slightly with speeds testing mild empty can test. Specialty Comments:  No specialty comments available.  Imaging: XR Shoulder Left Result Date: 08/15/2023 Radiographs of the left shoulder demonstrate no evidence of dislocation no arthropathy no evidence of fracture.  Does show some slight varus of the humeral head     PMFS History: Patient Active  Problem List   Diagnosis Date Noted   Pain in left shoulder 08/15/2023   Sinus tachycardia 10/15/2022   Cavitary lesion of lung 10/14/2022   Palpitation 10/14/2022   Dyspnea 10/13/2022   Fracture of left distal radius 01/14/2014   Nausea and vomiting of pregnancy, antepartum 03/11/2011   Past Medical History:  Diagnosis Date   Headache(784.0)    Heart murmur    as baby   History of chlamydia    History of gonorrhea    Preterm labor    Urinary tract infection     Family History  Problem Relation Age of Onset   Breast cancer Paternal Aunt        in 20's or 30's   Anesthesia problems Neg Hx    Hearing loss Neg Hx     Past Surgical History:  Procedure Laterality Date   DILATION AND CURETTAGE OF UTERUS      INDUCED ABORTION     LAPAROSCOPIC TUBAL LIGATION  01/09/2012   Procedure: LAPAROSCOPIC TUBAL LIGATION;  Surgeon: Kathreen Cosier, MD;  Location: [facility];  Service: Gynecology;  Laterality: Bilateral;   ORIF WRIST FRACTURE Left 01/15/2014   Procedure: OPEN REDUCTION INTERNAL FIXATION (ORIF) WRIST FRACTURE;  Surgeon: Sharma Covert, MD;  Location: [facility];  Service: Orthopedics;  Laterality: Left;   TUBAL LIGATION     Social History   Occupational History   Not on file  Tobacco Use   Smoking status: Some Days    Current packs/day: 0.25    Types: Cigarettes   Smokeless tobacco: Never  Vaping Use   Vaping status: Some Days   Substances: Nicotine, Flavoring  Substance and Sexual Activity   Alcohol use: Not Currently   Drug use: No   Sexual activity: Yes    Birth control/protection: Surgical

## 2023-08-18 ENCOUNTER — Ambulatory Visit
Admission: RE | Admit: 2023-08-18 | Discharge: 2023-08-18 | Disposition: A | Discharge status: Home / Self Care | Payer: 59 | Source: Ambulatory Visit | Attending: Physician Assistant | Admitting: Physician Assistant

## 2023-08-18 DIAGNOSIS — M25512 Pain in left shoulder: Secondary | ICD-10-CM

## 2023-08-18 MED ORDER — IOPAMIDOL (ISOVUE-M 200) INJECTION 41%
10.0000 mL | Freq: Once | INTRAMUSCULAR | Status: AC
  Administered 2023-08-18: 10 mL via INTRA_ARTICULAR

## 2024-03-01 ENCOUNTER — Telehealth (INDEPENDENT_AMBULATORY_CARE_PROVIDER_SITE_OTHER): Payer: Self-pay | Admitting: *Deleted

## 2024-03-01 NOTE — Telephone Encounter (Signed)
 Flu vaccine- when arrives to clinic Hep B- needs titer prior to administering.   PNA 20 - will allow PCP to address  Called patient to inform. Unable to reach. No voicemail.

## 2024-03-18 ENCOUNTER — Encounter (INDEPENDENT_AMBULATORY_CARE_PROVIDER_SITE_OTHER): Payer: Self-pay | Admitting: Primary Care

## 2024-03-18 ENCOUNTER — Ambulatory Visit (INDEPENDENT_AMBULATORY_CARE_PROVIDER_SITE_OTHER): Admitting: Primary Care

## 2024-03-18 VITALS — BP 117/76 | HR 89 | Resp 19 | Ht 62.0 in

## 2024-03-18 DIAGNOSIS — R1033 Periumbilical pain: Secondary | ICD-10-CM | POA: Diagnosis not present

## 2024-03-18 DIAGNOSIS — F411 Generalized anxiety disorder: Secondary | ICD-10-CM

## 2024-03-18 DIAGNOSIS — Z79899 Other long term (current) drug therapy: Secondary | ICD-10-CM | POA: Diagnosis not present

## 2024-03-18 DIAGNOSIS — N920 Excessive and frequent menstruation with regular cycle: Secondary | ICD-10-CM | POA: Diagnosis not present

## 2024-03-18 DIAGNOSIS — Z760 Encounter for issue of repeat prescription: Secondary | ICD-10-CM

## 2024-03-18 MED ORDER — BUSPIRONE HCL 7.5 MG PO TABS: 7.5000 mg | ORAL_TABLET | Freq: Two times a day (BID) | ORAL | 0 refills | Status: AC

## 2024-03-18 NOTE — Progress Notes (Signed)
 [facility]  Tara [name] is a 38 y.o. female presents to office today for annual physical exam examination.    Concerns today include: 1.  She has been having abdominal pain around umbilical area She has also been irritable, difficult to get along with patient has ran out of her BuSpar  and requesting a refill because she has noticed a change in her behavior.  Mood swings  Occupation:.  , Marital conflict hide no bleeding status: Single, Substance use: No  Diet: No, Exercise: Yes No Health Maintenance  Topic Date Due   Pneumococcal Vaccine (1 of 2 - PCV) Never done   Hepatitis B Vaccines 19-59 Average Risk (1 of 3 - 19+ 3-dose series) Never done   COVID-19 Vaccine (1 - 2024-25 season) Never done   INFLUENZA VACCINE  02/20/2024   Cervical Cancer Screening (HPV/Pap Cotest)  09/13/2027   DTaP/Tdap/Td (3 - Td or Tdap) 02/19/2032   HPV VACCINES  Completed   Hepatitis C Screening  Completed   HIV Screening  Completed   Meningococcal B Vaccine  Aged Out     Past Medical History:  Diagnosis Date   Headache(784.0)    Heart murmur    as baby   History of chlamydia    History of gonorrhea    Preterm labor    Urinary tract infection    Social History   Socioeconomic History   Marital status: Divorced    Spouse name: Not on file   Number of children: Not on file   Years of education: Not on file   Highest education level: 9th grade  Occupational History   Not on file  Tobacco Use   Smoking status: Some Days    Current packs/day: 0.25    Types: Cigarettes   Smokeless tobacco: Never  Vaping Use   Vaping status: Some Days   Substances: Nicotine, Flavoring  Substance and Sexual Activity   Alcohol use: Not Currently   Drug use: No   Sexual activity: Yes    Birth control/protection: Surgical  Other Topics Concern   Not on file  Social History Narrative   Not on file   Social Drivers of Health   Financial Resource Strain: Low Risk  (07/13/2023)    Overall Financial Resource Strain (CARDIA)    Difficulty of Paying Living Expenses: Not hard at all  Food Insecurity: No Food Insecurity (07/13/2023)   Hunger Vital Sign    Worried About Running Out of Food in the Last Year: Never true    Ran Out of Food in the Last Year: Never true  Transportation Needs: No Transportation Needs (07/13/2023)   PRAPARE - Administrator, Civil Service (Medical): No    Lack of Transportation (Non-Medical): No  Physical Activity: Sufficiently Active (07/13/2023)   Exercise Vital Sign    Days of Exercise per Week: 6 days    Minutes of Exercise per Session: 30 min  Recent Concern: Physical Activity - Insufficiently Active (05/19/2023)   Exercise Vital Sign    Days of Exercise per Week: 1 day    Minutes of Exercise per Session: 20 min  Stress: No Stress Concern Present (07/13/2023)   [employer] of Occupational Health - Occupational Stress Questionnaire    Feeling of Stress : Only a little  Social Connections: Moderately Isolated (07/13/2023)   Social Connection and Isolation Panel    Frequency of Communication with Friends and Family: Three times a week    Frequency of Social Gatherings with Friends and  Family: Once a week    Attends Religious Services: 1 to 4 times per year    Active Member of Clubs or Organizations: No    Attends Banker Meetings: Never    Marital Status: Separated  Intimate Partner Violence: Not At Risk (10/14/2022)   Humiliation, Afraid, Rape, and Kick questionnaire    Fear of Current or Ex-Partner: No    Emotionally Abused: No    Physically Abused: No    Sexually Abused: No   Past Surgical History:  Procedure Laterality Date   DILATION AND CURETTAGE OF UTERUS     INDUCED ABORTION     LAPAROSCOPIC TUBAL LIGATION  01/09/2012   Procedure: LAPAROSCOPIC TUBAL LIGATION;  Surgeon: Aida DELENA Na, MD;  Location: [facility];  Service: Gynecology;  Laterality: Bilateral;   ORIF WRIST FRACTURE Left 01/15/2014    Procedure: OPEN REDUCTION INTERNAL FIXATION (ORIF) WRIST FRACTURE;  Surgeon: Prentice LELON Pagan, MD;  Location: [facility];  Service: Orthopedics;  Laterality: Left;   TUBAL LIGATION     Family History  Problem Relation Age of Onset   Breast cancer Paternal Aunt        in 20's or 30's   Anesthesia problems Neg Hx    Hearing loss Neg Hx     Current Outpatient Medications:    busPIRone  (BUSPAR ) 7.5 MG tablet, Take 1 tablet (7.5 mg total) by mouth 2 (two) times daily., Disp: 180 tablet, Rfl: 0   tiZANidine  (ZANAFLEX ) 4 MG tablet, Take 1 tablet (4 mg total) by mouth every 8 (eight) hours as needed for muscle spasms., Disp: 30 tablet, Rfl: 1 Outpatient Encounter Medications as of 03/18/2024  Medication Sig   busPIRone  (BUSPAR ) 7.5 MG tablet Take 1 tablet (7.5 mg total) by mouth 2 (two) times daily.   tiZANidine  (ZANAFLEX ) 4 MG tablet Take 1 tablet (4 mg total) by mouth every 8 (eight) hours as needed for muscle spasms.   [DISCONTINUED] diclofenac  Sodium (VOLTAREN ) 1 % GEL Apply 2 g topically 4 (four) times daily.   [DISCONTINUED] fluconazole  (DIFLUCAN ) 150 MG tablet Take 1 tablet (150 mg total) by mouth daily. (Patient not taking: Reported on 03/18/2024)   [DISCONTINUED] metroNIDAZOLE  (FLAGYL ) 500 MG tablet Take 1 tablet (500 mg total) by mouth 2 (two) times daily. (Patient not taking: Reported on 03/18/2024)   [DISCONTINUED] nitrofurantoin , macrocrystal-monohydrate, (MACROBID ) 100 MG capsule Take 1 capsule (100 mg total) by mouth 2 (two) times daily. (Patient not taking: Reported on 03/18/2024)   [DISCONTINUED] phenazopyridine  (PYRIDIUM ) 100 MG tablet Take 1 tablet (100 mg total) by mouth 3 (three) times daily as needed for pain. (Patient not taking: Reported on 03/18/2024)   No facility-administered encounter medications on file as of 03/18/2024.    No Known Allergies   ROS: Review of Systems Pertinent items noted in HPI and remainder of comprehensive ROS otherwise negative.    Physical  exam Physical exam: General: Vital signs reviewed.  Patient is well-developed and well-nourished,  in no acute distress and cooperative with exam. Head: Normocephalic and atraumatic. Eyes: EOMI, conjunctivae normal, no scleral icterus. Neck: Supple, trachea midline, normal ROM, no JVD, masses, thyromegaly, or carotid bruit present. Cardiovascular: RRR, S1 normal, S2 normal, no murmurs, gallops, or rubs. Pulmonary/Chest: Clear to auscultation bilaterally, no wheezes, rales, or rhonchi. Abdominal: Soft, non-tender, non-distended, BS +, no masses, organomegaly, or guarding present. Musculoskeletal: No joint deformities, erythema, or stiffness, ROM full and nontender. Extremities: No lower extremity edema bilaterally,  pulses symmetric and intact bilaterally. No cyanosis or clubbing.  Neurological: A&O x3, Strength is normal Skin: Warm, dry and intact. No rashes or erythema. Psychiatric: Normal mood and affect. speech and behavior is normal. Cognition and memory are normal.    Assessment/ Plan: Tara [name] here for annual physical exam.   Mayumi was seen today for annual exam.  Diagnoses and all orders for this visit:  Periumbilical abdominal pain -     Ambulatory referral to Gynecology  Menorrhagia with regular cycle -     CBC with Differential  Generalized anxiety disorder -     busPIRone  (BUSPAR ) 7.5 MG tablet; Take 1 tablet (7.5 mg total) by mouth 2 (two) times daily.  Medication management -     [id number]+EGFR  Medication refill  busPIRone  (BUSPAR ) 7.5 MG tablet; Take 1 tablet (7.5 mg total) by mouth 2 (two) times daily.    Counseled on healthy lifestyle choices, including diet (rich in fruits, vegetables and lean meats and low in salt and simple carbohydrates) and exercise (at least 30 minutes of moderate physical activity daily).  Patient to follow up in 1 year for annual exam or sooner if needed.  The above assessment and management plan was discussed with the patient.  The patient verbalized understanding of and has agreed to the management plan. Patient is aware to call the clinic if symptoms persist or worsen. Patient is aware when to return to the clinic for a follow-up visit. Patient educated on when it is appropriate to go to the emergency department.   This note has been created with Education officer, environmental. Any transcriptional errors are unintentional.   Rosaline SHAUNNA Bohr, NP 03/18/2024, 11:52 AM

## 2024-03-19 LAB — CBC WITH DIFFERENTIAL/PLATELET
Basophils Absolute: 0 x10E3/uL (ref 0.0–0.2)
Basos: 1 %
EOS (ABSOLUTE): 0.1 x10E3/uL (ref 0.0–0.4)
Eos: 2 %
Hematocrit: 37.3 % (ref 34.0–46.6)
Hemoglobin: 11.8 g/dL (ref 11.1–15.9)
Immature Grans (Abs): 0 x10E3/uL (ref 0.0–0.1)
Immature Granulocytes: 0 %
Lymphocytes Absolute: 1.6 x10E3/uL (ref 0.7–3.1)
Lymphs: 38 %
MCH: 31 pg (ref 26.6–33.0)
MCHC: 31.6 g/dL (ref 31.5–35.7)
MCV: 98 fL — ABNORMAL HIGH (ref 79–97)
Monocytes Absolute: 0.3 x10E3/uL (ref 0.1–0.9)
Monocytes: 6 %
Neutrophils Absolute: 2.3 x10E3/uL (ref 1.4–7.0)
Neutrophils: 53 %
Platelets: 323 x10E3/uL (ref 150–450)
RBC: 3.81 x10E6/uL (ref 3.77–5.28)
RDW: 12.1 % (ref 11.7–15.4)
WBC: 4.3 x10E3/uL (ref 3.4–10.8)

## 2024-03-19 LAB — CMP14+EGFR
ALT: 12 IU/L (ref 0–32)
AST: 21 IU/L (ref 0–40)
Albumin: 4.6 g/dL (ref 3.9–4.9)
Alkaline Phosphatase: 56 IU/L (ref 44–121)
BUN/Creatinine Ratio: 27 — ABNORMAL HIGH (ref 9–23)
BUN: 16 mg/dL (ref 6–20)
Bilirubin Total: 0.5 mg/dL (ref 0.0–1.2)
CO2: 21 mmol/L (ref 20–29)
Calcium: 9.3 mg/dL (ref 8.7–10.2)
Chloride: 102 mmol/L (ref 96–106)
Creatinine, Ser: 0.59 mg/dL (ref 0.57–1.00)
Globulin, Total: 2.4 g/dL (ref 1.5–4.5)
Glucose: 74 mg/dL (ref 70–99)
Potassium: 4.1 mmol/L (ref 3.5–5.2)
Sodium: 139 mmol/L (ref 134–144)
Total Protein: 7 g/dL (ref 6.0–8.5)
eGFR: 119 mL/min/1.73 (ref >59)

## 2024-03-23 ENCOUNTER — Ambulatory Visit (INDEPENDENT_AMBULATORY_CARE_PROVIDER_SITE_OTHER): Payer: Self-pay | Admitting: Primary Care

## 2024-04-23 ENCOUNTER — Emergency Department (HOSPITAL_COMMUNITY)

## 2024-04-23 ENCOUNTER — Emergency Department (HOSPITAL_COMMUNITY)
Admission: EM | Admit: 2024-04-23 | Discharge: 2024-04-24 | Disposition: A | Discharge status: Home / Self Care | Attending: Emergency Medicine | Admitting: Emergency Medicine

## 2024-04-23 ENCOUNTER — Other Ambulatory Visit: Payer: Self-pay

## 2024-04-23 DIAGNOSIS — S5002XA Contusion of left elbow, initial encounter: Secondary | ICD-10-CM | POA: Insufficient documentation

## 2024-04-23 DIAGNOSIS — W1839XA Other fall on same level, initial encounter: Secondary | ICD-10-CM | POA: Insufficient documentation

## 2024-04-23 DIAGNOSIS — M25522 Pain in left elbow: Secondary | ICD-10-CM | POA: Diagnosis present

## 2024-04-23 MED ORDER — NAPROXEN 500 MG PO TABS
500.0000 mg | ORAL_TABLET | Freq: Once | ORAL | Status: DC
  Filled 2024-04-23: qty 1

## 2024-04-23 MED ORDER — NAPROXEN 500 MG PO TABS: 500.0000 mg | ORAL_TABLET | Freq: Two times a day (BID) | ORAL | 0 refills | Status: AC

## 2024-04-23 NOTE — Discharge Instructions (Addendum)
Apply ice to areas of injury 3-4 times per day to limit inflammation.  Avoid strenuous activity and heavy lifting.  We recommend consistent use of naproxen for pain. We recommend follow-up with a primary care doctor to ensure resolution of symptoms.  Return to the ED for any new or concerning symptoms.

## 2024-04-23 NOTE — ED Provider Notes (Signed)
 [place] EMERGENCY DEPARTMENT AT [facility] Provider Note   CSN: [id number] Arrival date & time: 04/23/24  2129     Patient presents with: Arm Injury   Tara [name] is a 38 y.o. female.   38 year old right-hand-dominant female presents to the emergency department for left elbow pain.  States that she was trying to prevent her boyfriend from falling when she fell, herself, and he landed on her elbow.  She has had pain with movement of the arm.  Denies taking any medications prior to arrival for symptoms.  No associated extremity numbness.  The history is provided by the patient. No language interpreter was used.  Arm Injury      Prior to Admission medications   Medication Sig Start Date End Date Taking? Authorizing Provider  naproxen  (NAPROSYN ) 500 MG tablet Take 1 tablet (500 mg total) by mouth 2 (two) times daily. 04/23/24  Yes Keith Sor, PA-C  busPIRone  (BUSPAR ) 7.5 MG tablet Take 1 tablet (7.5 mg total) by mouth 2 (two) times daily. 03/18/24   Celestia Rosaline SQUIBB, NP  tiZANidine  (ZANAFLEX ) 4 MG tablet Take 1 tablet (4 mg total) by mouth every 8 (eight) hours as needed for muscle spasms. 05/21/23   Celestia Rosaline SQUIBB, NP    Allergies: Patient has no known allergies.    Review of Systems Ten systems reviewed and are negative for acute change, except as noted in the HPI.    Updated Vital Signs BP 133/82 (BP Location: Left Arm)   Pulse 80   Temp 98.4 F (36.9 C) (Oral)   Resp 18   Ht 5' 3 (1.6 m)   Wt 57.2 kg   SpO2 100%   BMI 22.32 kg/m   Physical Exam Vitals and nursing note reviewed.  Constitutional:      General: She is not in acute distress.    Appearance: She is well-developed. She is not diaphoretic.     Comments: Nontoxic appearing  HENT:     Head: Normocephalic and atraumatic.  Eyes:     General: No scleral icterus.    Conjunctiva/sclera: Conjunctivae normal.  Cardiovascular:     Rate and Rhythm: Normal rate and regular rhythm.      Pulses: Normal pulses.     Comments: Distal radial pulse 2+ in the LUE Pulmonary:     Effort: Pulmonary effort is normal. No respiratory distress.     Comments: Respirations even and unlabored Musculoskeletal:     Cervical back: Normal range of motion.     Comments: PROM of the L elbow intact. AROM limited 2/2 pain, mostly with elbow extension past 90 degrees. No crepitus or deformity of the L elbow. Finger to thumb opposition intact in the L hand. Grip strength preserved.  Skin:    General: Skin is warm and dry.     Coloration: Skin is not pale.     Findings: No erythema or rash.  Neurological:     Mental Status: She is alert and oriented to person, place, and time.  Psychiatric:        Behavior: Behavior normal.     (all labs ordered are listed, but only abnormal results are displayed) Labs Reviewed - No data to display  EKG: None  Radiology: DG Elbow Complete Left Result Date: 04/23/2024 EXAM: 3 VIEW(S) XRAY OF THE LEFT ELBOW COMPARISON: None available. CLINICAL HISTORY: elbow injury. Fall left elbow pain FINDINGS: BONES AND JOINTS: No acute fracture. No focal osseous lesion. No joint dislocation. No joint effusion. SOFT  TISSUES: The soft tissues are unremarkable. IMPRESSION: 1. No acute abnormality. Electronically signed by: Dorethia Molt MD 04/23/2024 10:45 PM EDT RP Workstation: [id number]     Procedures   Medications Ordered in the ED  naproxen  (NAPROSYN ) tablet 500 mg (has no administration in time range)                                    Medical Decision Making Amount and/or Complexity of Data Reviewed Radiology: ordered.  Risk Prescription drug management.   This patient presents to the ED for concern of L elbow pain, this involves an extensive number of treatment options, and is a complaint that carries with it a high risk of complications and morbidity.  The differential diagnosis includes contusion vs sprain/strain vs fracture vs dislocation vs  bursitis.   Co morbidities that complicate the patient evaluation  Heart murmur   Additional history obtained:  Additional history obtained from boyfriend, at bedside   Imaging Studies ordered:  I ordered imaging studies including Xray L elbow  I independently visualized and interpreted imaging which showed no acute abnormality I agree with the radiologist interpretation   Cardiac Monitoring:  The patient was maintained on a cardiac monitor.  I personally viewed and interpreted the cardiac monitored which showed an underlying rhythm of: NSR   Medicines ordered and prescription drug management:  I ordered medication including naproxen  for pain  I have reviewed the patients home medicines and have made adjustments as needed   Test Considered:  CT L elbow to r/o occult fx - held due to low suspicion   Problem List / ED Course:  Patient presents to the emergency department for evaluation of L elbow pain. Patient neurovascularly intact on exam. Imaging negative for fracture, dislocation, bony deformity. No erythema, heat to touch to the affected area; no concern for septic joint. Compartments in the affected extremity are soft.  Plan for supportive management including RICE and NSAIDs; primary care follow up as needed. Given sling for comfort.   Reevaluation:  After the interventions noted above, I reevaluated the patient and found that they have :stayed the same   Social Determinants of Health:  Good social support   Dispostion:  After consideration of the diagnostic results and the patients response to treatment, I feel that the patent would benefit from supportive care measures including RICE, NSAIDs. Return precautions discussed and provided. Patient discharged in stable condition with no unaddressed concerns.       Final diagnoses:  Contusion of left elbow, initial encounter    ED Discharge Orders          Ordered    naproxen  (NAPROSYN ) 500 MG tablet   2 times daily        04/23/24 2254               Keith Sor, PA-C 04/23/24 2313    Palumbo, April, MD 04/23/24 2320

## 2024-04-23 NOTE — ED Triage Notes (Signed)
 Pt states her boyfriend was falling and she tried to catch him, no has c/o pain in left elbow and is unable to straighten her arm

## 2024-04-25 ENCOUNTER — Telehealth: Admitting: Family Medicine

## 2024-04-25 DIAGNOSIS — B3731 Acute candidiasis of vulva and vagina: Secondary | ICD-10-CM

## 2024-04-25 MED ORDER — FLUCONAZOLE 150 MG PO TABS: 150.0000 mg | ORAL_TABLET | Freq: Every day | ORAL | 0 refills | Status: AC

## 2024-04-25 NOTE — Progress Notes (Signed)
 Virtual Visit Consent   Tara [name], you are scheduled for a virtual visit with a [facility] provider today. Just as with appointments in the office, your consent must be obtained to participate. Your consent will be active for this visit and any virtual visit you may have with one of our providers in the next 365 days. If you have a MyChart account, a copy of this consent can be sent to you electronically.  As this is a virtual visit, video technology does not allow for your provider to perform a traditional examination. This may limit your provider's ability to fully assess your condition. If your provider identifies any concerns that need to be evaluated in person or the need to arrange testing (such as labs, EKG, etc.), we will make arrangements to do so. Although advances in technology are sophisticated, we cannot ensure that it will always work on either your end or our end. If the connection with a video visit is poor, the visit may have to be switched to a telephone visit. With either a video or telephone visit, we are not always able to ensure that we have a secure connection.  By engaging in this virtual visit, you consent to the provision of healthcare and authorize for your insurance to be billed (if applicable) for the services provided during this visit. Depending on your insurance coverage, you may receive a charge related to this service.  I need to obtain your verbal consent now. Are you willing to proceed with your visit today? Reve Seda has provided verbal consent on 04/25/2024 for a virtual visit (video or telephone). Loa Lamp, FNP  Date: 04/25/2024 10:06 AM   Virtual Visit via Video Note   I, Loa Lamp, connected with  Marybelle Grose  ([id number], [birth year]) on 04/25/24 at 10:15 AM EDT by a video-enabled telemedicine application and verified that I am speaking with the correct person using two identifiers.  Location: Patient: Virtual Visit Location Patient:  Home Provider: Virtual Visit Location Provider: Home Office   I discussed the limitations of evaluation and management by telemedicine and the availability of in person appointments. The patient expressed understanding and agreed to proceed.    History of Present Illness: Tara [name] is a 38 y.o. who identifies as a female who was assigned female at birth, and is being seen today for vaginal discharge, thick white with itching and requests diflucan . Denies abd pain and fever. SABRA  HPI: HPI  Problems:  Patient Active Problem List   Diagnosis Date Noted   Pain in left shoulder 08/15/2023   Sinus tachycardia 10/15/2022   Cavitary lesion of lung 10/14/2022   Palpitation 10/14/2022   Dyspnea 10/13/2022   Fracture of left distal radius 01/14/2014   Nausea and vomiting of pregnancy, antepartum 03/11/2011    Allergies: No Known Allergies Medications:  Current Outpatient Medications:    fluconazole  (DIFLUCAN ) 150 MG tablet, Take 1 tablet (150 mg total) by mouth daily for 1 day., Disp: 1 tablet, Rfl: 0   busPIRone  (BUSPAR ) 7.5 MG tablet, Take 1 tablet (7.5 mg total) by mouth 2 (two) times daily., Disp: 180 tablet, Rfl: 0   naproxen  (NAPROSYN ) 500 MG tablet, Take 1 tablet (500 mg total) by mouth 2 (two) times daily., Disp: 30 tablet, Rfl: 0   tiZANidine  (ZANAFLEX ) 4 MG tablet, Take 1 tablet (4 mg total) by mouth every 8 (eight) hours as needed for muscle spasms., Disp: 30 tablet, Rfl: 1  Observations/Objective: Patient is well-developed, well-nourished in no acute distress.  Resting comfortably  at home.  Head is normocephalic, atraumatic.  No labored breathing.  Speech is clear and coherent with logical content.  Patient is alert and oriented at baseline.    Assessment and Plan: 1. Candidiasis of vagina (Primary)  F/U with gyn as needed.   Follow Up Instructions: I discussed the assessment and treatment plan with the patient. The patient was provided an opportunity to ask questions  and all were answered. The patient agreed with the plan and demonstrated an understanding of the instructions.  A copy of instructions were sent to the patient via MyChart unless otherwise noted below.     The patient was advised to call back or seek an in-person evaluation if the symptoms worsen or if the condition fails to improve as anticipated.    Laron Boorman, FNP

## 2024-04-25 NOTE — Patient Instructions (Signed)

## 2024-05-24 ENCOUNTER — Encounter: Payer: Self-pay | Admitting: Radiology

## 2024-05-25 ENCOUNTER — Other Ambulatory Visit: Payer: Self-pay

## 2024-05-25 ENCOUNTER — Emergency Department (HOSPITAL_COMMUNITY)
Admission: EM | Admit: 2024-05-25 | Discharge: 2024-05-25 | Disposition: A | Discharge status: Home / Self Care | Attending: Emergency Medicine | Admitting: Emergency Medicine

## 2024-05-25 ENCOUNTER — Encounter (HOSPITAL_COMMUNITY): Payer: Self-pay

## 2024-05-25 DIAGNOSIS — R0602 Shortness of breath: Secondary | ICD-10-CM | POA: Insufficient documentation

## 2024-05-25 DIAGNOSIS — R002 Palpitations: Secondary | ICD-10-CM | POA: Insufficient documentation

## 2024-05-25 NOTE — ED Provider Notes (Signed)
 [place] EMERGENCY DEPARTMENT AT [facility] Provider Note   CSN: [id number] Arrival date & time: 05/25/24  1039     Patient presents with: Palpitations and Shortness of Breath   Tara [name] is a 38 y.o. female.   Patient presents with tachycardia that began this morning.  States that she did have a energy drink yesterday evening.  Notes that when she awoke she has several hours of shortness of breath with increased heart rate.  Denies any chest pain with this.  No leg pain or swelling.  No caffeine  use this morning.  No illicit drug use.  Symptoms gradually resolved on her own.  Does have history of anxiety and takes BuSpar  for this.  States she has had some increase stress recently.  Heart rate is come back to her normal rate.  States he feels back to her baseline.  Denies any history of thyroid  issues       Prior to Admission medications   Medication Sig Start Date End Date Taking? Authorizing Provider  busPIRone  (BUSPAR ) 7.5 MG tablet Take 1 tablet (7.5 mg total) by mouth 2 (two) times daily. 03/18/24   Celestia Rosaline SQUIBB, NP  naproxen  (NAPROSYN ) 500 MG tablet Take 1 tablet (500 mg total) by mouth 2 (two) times daily. 04/23/24   Keith Sor, PA-C  tiZANidine  (ZANAFLEX ) 4 MG tablet Take 1 tablet (4 mg total) by mouth every 8 (eight) hours as needed for muscle spasms. 05/21/23   Celestia Rosaline SQUIBB, NP    Allergies: Patient has no known allergies.    Review of Systems  All other systems reviewed and are negative.   Updated Vital Signs BP 111/84 (BP Location: Right Arm)   Pulse (!) 113   Temp 98.3 F (36.8 C) (Oral)   Resp 18   Ht 1.6 m (5' 3)   Wt 55.8 kg   SpO2 100%   BMI 21.79 kg/m   Physical Exam Vitals and nursing note reviewed.  Constitutional:      General: She is not in acute distress.    Appearance: Normal appearance. She is well-developed. She is not toxic-appearing.  HENT:     Head: Normocephalic and atraumatic.  Eyes:     General:  Lids are normal.     Conjunctiva/sclera: Conjunctivae normal.     Pupils: Pupils are equal, round, and reactive to light.  Neck:     Thyroid : No thyroid  mass.     Trachea: No tracheal deviation.  Cardiovascular:     Rate and Rhythm: Normal rate and regular rhythm.     Heart sounds: Normal heart sounds. No murmur heard.    No gallop.  Pulmonary:     Effort: Pulmonary effort is normal. No respiratory distress.     Breath sounds: Normal breath sounds. No stridor. No decreased breath sounds, wheezing, rhonchi or rales.  Abdominal:     General: There is no distension.     Palpations: Abdomen is soft.     Tenderness: There is no abdominal tenderness. There is no rebound.  Musculoskeletal:        General: No tenderness. Normal range of motion.     Cervical back: Normal range of motion and neck supple.  Skin:    General: Skin is warm and dry.     Findings: No abrasion or rash.  Neurological:     Mental Status: She is alert and oriented to person, place, and time. Mental status is at baseline.     GCS: GCS eye  subscore is 4. GCS verbal subscore is 5. GCS motor subscore is 6.     Cranial Nerves: No cranial nerve deficit.     Sensory: No sensory deficit.     Motor: Motor function is intact.  Psychiatric:        Attention and Perception: Attention normal.        Speech: Speech normal.        Behavior: Behavior normal.     (all labs ordered are listed, but only abnormal results are displayed) Labs Reviewed - No data to display  EKG: EKG Interpretation Date/Time:  Tuesday May 25 2024 10:54:38 EST Ventricular Rate:  111 PR Interval:    QRS Duration:  72 QT Interval:  327 QTC Calculation: 445 R Axis:   79  Text Interpretation: Junctional tachycardia Confirmed by Dasie Faden ([id number]) on 05/25/2024 12:31:45 PM  Radiology: No results found.   Procedures   Medications Ordered in the ED - No data to display                                  Medical Decision  Making Amount and/or Complexity of Data Reviewed ECG/medicine tests: ordered.   Patient's EKG shows a junctional tachycardia.  Patient is monitoring her room shows sinus rhythm at a rate of 75.  I offered patient blood work as well as further evaluation.  She significant other have declined at this point.  They state that she has had this for the past this is very similar to go home.  I explained to her the risks of sudden cardiac event or death due to her fast heart rate.  She states that she accepts this.  I encouraged her to return if she changes her mind and follow-up with her doctor.    Final diagnoses:  None    ED Discharge Orders     None          Dasie Faden, MD 05/25/24 1233

## 2024-05-25 NOTE — Discharge Instructions (Signed)
 You were offered blood work and x-rays and have deferred at this time.  Avoid all caffeinated drinks.  Please return if you change your mind.  Follow-up with your doctor this week

## 2024-05-25 NOTE — ED Triage Notes (Signed)
 Pt to er, pt states that she is here for palpitation and sob, states that it started when she woke up this am, states that walking makes both worse.  Pt reports a similar episode in the past, that come and go

## 2024-07-28 ENCOUNTER — Telehealth

## 2024-07-28 DIAGNOSIS — Z5321 Procedure and treatment not carried out due to patient leaving prior to being seen by health care provider: Secondary | ICD-10-CM

## 2024-07-28 NOTE — Progress Notes (Signed)
 Patient arrived for Visit at 10:01am. Prior to the 10:15 appt she logged off or left the wait room screen. Provider logged in at 10:16am. Provider sent patient a link at 10:17(email and phone), 10:19 (phone only), and 10:21 (phone only).   Patient never attempted to log back in.   Provider awaited the 10 minutes for allotted appointment.   Will mark Left without being seen and No Charge.

## 2024-07-29 ENCOUNTER — Telehealth

## 2024-07-29 ENCOUNTER — Telehealth: Admitting: Nurse Practitioner

## 2024-07-29 DIAGNOSIS — R3989 Other symptoms and signs involving the genitourinary system: Secondary | ICD-10-CM

## 2024-07-29 MED ORDER — NITROFURANTOIN MONOHYD MACRO 100 MG PO CAPS: 100.0000 mg | ORAL_CAPSULE | Freq: Two times a day (BID) | ORAL | 0 refills | Status: AC

## 2024-07-29 NOTE — Progress Notes (Signed)
 We are sorry that you are not feeling well.  Here is how we plan to help!  Based on what you shared with me it looks like you most likely have a simple urinary tract infection.  A UTI (Urinary Tract Infection) is a bacterial infection of the bladder.  Most cases of urinary tract infections are simple to treat but a key part of your care is to encourage you to drink plenty of fluids and watch your symptoms carefully.  I have prescribed MacroBid  100 mg twice a day for 5 days.  Your symptoms should gradually improve. Call us  if the burning in your urine worsens, you develop worsening fever, back pain or pelvic pain or if your symptoms do not resolve after completing the antibiotic.  Urinary tract infections can be prevented by drinking plenty of water to keep your body hydrated.  Also be sure when you wipe, wipe from front to back and don't hold it in!  If possible, empty your bladder every 4 hours.  Your e-visit answers were reviewed by a board certified advanced clinical practitioner to complete your personal care plan.  Depending on the condition, your plan could have included both over the counter or prescription medications.  If there is a problem please reply  once you have received a response from your provider.  Your safety is important to us .  If you have drug allergies check your prescription carefully.    You can use MyChart to ask questions about todays visit, request a non-urgent call back, or ask for a work or school excuse for 24 hours related to this e-Visit. If it has been greater than 24 hours you will need to follow up with your provider, or enter a new e-Visit to address those concerns.   You will get an e-mail in the next two days asking about your experience.  I hope that your e-visit has been valuable and will speed your recovery. Thank you for using e-visits.  I have spent 5 minutes in review of e-visit questionnaire, review and updating patient chart, medical decision  making and response to patient.   Lauraine Kitty, FNP

## 2024-08-27 ENCOUNTER — Encounter: Payer: Self-pay | Admitting: Obstetrics and Gynecology

## 2024-08-27 ENCOUNTER — Ambulatory Visit: Admitting: Obstetrics and Gynecology

## 2024-08-27 ENCOUNTER — Other Ambulatory Visit: Payer: Self-pay

## 2024-08-27 VITALS — BP 119/76 | HR 66 | Ht 63.0 in | Wt 129.0 lb

## 2024-08-27 DIAGNOSIS — R1033 Periumbilical pain: Secondary | ICD-10-CM

## 2024-08-27 DIAGNOSIS — N920 Excessive and frequent menstruation with regular cycle: Secondary | ICD-10-CM

## 2024-08-27 DIAGNOSIS — N946 Dysmenorrhea, unspecified: Secondary | ICD-10-CM

## 2024-08-27 NOTE — Progress Notes (Unsigned)
 p  GYNECOLOGY PROGRESS NOTE  History:  39 y.o. [id number] presents to [facility] referred from PCP for periumbilical pain. HX of BTL   The following portions of the patient's history were reviewed and updated as appropriate: allergies, current medications, past family history, past medical history, past social history, past surgical history and problem list. Last pap smear on *** was normal, *** HRHPV.  Health Maintenance Due  Topic Date Due   COVID-19 Vaccine (1) Never done   Pneumococcal Vaccine (1 of 2 - PCV) Never done   Hepatitis B Vaccines 19-59 Average Risk (1 of 3 - 19+ 3-dose series) Never done   Influenza Vaccine  02/20/2024     Review of Systems:  Pertinent items are noted in HPI.   Objective:  Physical Exam Blood pressure 119/76, pulse 66, height 5' 3 (1.6 m), weight 129 lb (58.5 kg), last menstrual period 08/22/2024. VS reviewed, nursing note reviewed,  Constitutional: well developed, well nourished, no distress HEENT: normocephalic CV: normal rate Pulm/chest wall: normal effort Breast Exam: deferred Abdomen: soft Neuro: alert and oriented x 3 Skin: warm, dry Psych: affect normal Pelvic exam: Cervix pink, visually closed, without lesion, scant white creamy discharge, vaginal walls and external genitalia normal Bimanual exam: Cervix 0/long/high, firm, anterior, neg CMT, uterus nontender, nonenlarged, adnexa without tenderness, enlargement, or mass  Assessment & Plan:  There are no diagnoses linked to this encounter.  No follow-ups on file.   Nidia Daring, FNP 8:35 AM

## 2024-09-10 ENCOUNTER — Ambulatory Visit (HOSPITAL_COMMUNITY)
